# Patient Record
Sex: Female | Born: 1975
Health system: Southern US, Community
[De-identification: ages and names within clinical notes are randomized; demographics above are authoritative.]

## PROBLEM LIST (undated history)

## (undated) DIAGNOSIS — D649 Anemia, unspecified: Secondary | ICD-10-CM

## (undated) DIAGNOSIS — R51 Headache: Secondary | ICD-10-CM

## (undated) DIAGNOSIS — I10 Essential (primary) hypertension: Secondary | ICD-10-CM

## (undated) DIAGNOSIS — G4700 Insomnia, unspecified: Secondary | ICD-10-CM

## (undated) DIAGNOSIS — R519 Headache, unspecified: Secondary | ICD-10-CM

## (undated) DIAGNOSIS — L0291 Cutaneous abscess, unspecified: Secondary | ICD-10-CM

## (undated) DIAGNOSIS — Z9289 Personal history of other medical treatment: Secondary | ICD-10-CM

## (undated) HISTORY — PX: DILATION AND CURETTAGE OF UTERUS: SHX78

## (undated) HISTORY — PX: TONSILLECTOMY: SUR1361

---

## 2006-06-08 ENCOUNTER — Emergency Department (HOSPITAL_COMMUNITY): Admission: EM | Admit: 2006-06-08 | Discharge: 2006-06-09 | Payer: Self-pay | Admitting: Emergency Medicine

## 2006-07-26 ENCOUNTER — Emergency Department (HOSPITAL_COMMUNITY): Admission: EM | Admit: 2006-07-26 | Discharge: 2006-07-26 | Payer: Self-pay | Admitting: Emergency Medicine

## 2008-08-13 ENCOUNTER — Inpatient Hospital Stay (HOSPITAL_COMMUNITY): Admission: AD | Admit: 2008-08-13 | Discharge: 2008-08-13 | Payer: Self-pay | Admitting: Obstetrics & Gynecology

## 2008-08-16 ENCOUNTER — Inpatient Hospital Stay (HOSPITAL_COMMUNITY): Admission: RE | Admit: 2008-08-16 | Discharge: 2008-08-16 | Payer: Self-pay | Admitting: Obstetrics & Gynecology

## 2008-08-23 ENCOUNTER — Ambulatory Visit: Payer: Self-pay | Admitting: Advanced Practice Midwife

## 2008-08-23 ENCOUNTER — Inpatient Hospital Stay (HOSPITAL_COMMUNITY): Admission: RE | Admit: 2008-08-23 | Discharge: 2008-08-23 | Payer: Self-pay | Admitting: Obstetrics & Gynecology

## 2008-08-30 ENCOUNTER — Ambulatory Visit: Payer: Self-pay | Admitting: Obstetrics & Gynecology

## 2008-08-30 ENCOUNTER — Encounter: Payer: Self-pay | Admitting: Advanced Practice Midwife

## 2008-08-30 ENCOUNTER — Encounter: Payer: Self-pay | Admitting: Obstetrics and Gynecology

## 2008-08-30 LAB — CONVERTED CEMR LAB
Basophils Absolute: 0 10*3/uL (ref 0.0–0.1)
Basophils Relative: 0 % (ref 0–1)
Eosinophils Relative: 1 % (ref 0–5)
Hemoglobin: 12.3 g/dL (ref 12.0–15.0)
Hgb A2 Quant: 2.6 % (ref 2.2–3.2)
Hgb S Quant: 0 % (ref 0.0–0.0)
MCHC: 33.6 g/dL (ref 30.0–36.0)
Monocytes Relative: 5 % (ref 3–12)
RBC: 4.56 M/uL (ref 3.87–5.11)
RDW: 16.1 % — ABNORMAL HIGH (ref 11.5–15.5)
Rubella: 19.1 intl units/mL — ABNORMAL HIGH
WBC: 11.1 10*3/uL — ABNORMAL HIGH (ref 4.0–10.5)

## 2008-09-22 ENCOUNTER — Ambulatory Visit (HOSPITAL_COMMUNITY): Admission: RE | Admit: 2008-09-22 | Discharge: 2008-09-22 | Payer: Self-pay | Admitting: Obstetrics & Gynecology

## 2008-09-28 ENCOUNTER — Ambulatory Visit: Payer: Self-pay | Admitting: Obstetrics & Gynecology

## 2008-10-20 ENCOUNTER — Ambulatory Visit (HOSPITAL_COMMUNITY): Admission: RE | Admit: 2008-10-20 | Discharge: 2008-10-20 | Payer: Self-pay | Admitting: Obstetrics & Gynecology

## 2008-11-06 ENCOUNTER — Ambulatory Visit: Payer: Self-pay | Admitting: Obstetrics & Gynecology

## 2008-11-10 ENCOUNTER — Ambulatory Visit (HOSPITAL_COMMUNITY): Admission: RE | Admit: 2008-11-10 | Discharge: 2008-11-10 | Payer: Self-pay | Admitting: Obstetrics & Gynecology

## 2008-11-27 ENCOUNTER — Ambulatory Visit: Payer: Self-pay | Admitting: Obstetrics & Gynecology

## 2008-11-28 ENCOUNTER — Emergency Department (HOSPITAL_COMMUNITY): Admission: EM | Admit: 2008-11-28 | Discharge: 2008-11-28 | Payer: Self-pay | Admitting: Internal Medicine

## 2008-12-08 ENCOUNTER — Ambulatory Visit (HOSPITAL_COMMUNITY): Admission: RE | Admit: 2008-12-08 | Discharge: 2008-12-08 | Payer: Self-pay | Admitting: Obstetrics & Gynecology

## 2008-12-28 ENCOUNTER — Ambulatory Visit: Payer: Self-pay | Admitting: Obstetrics & Gynecology

## 2009-01-01 ENCOUNTER — Ambulatory Visit: Payer: Self-pay | Admitting: Obstetrics and Gynecology

## 2009-01-01 ENCOUNTER — Inpatient Hospital Stay (HOSPITAL_COMMUNITY): Admission: AD | Admit: 2009-01-01 | Discharge: 2009-01-01 | Payer: Self-pay | Admitting: Obstetrics & Gynecology

## 2009-01-04 ENCOUNTER — Ambulatory Visit: Payer: Self-pay | Admitting: Obstetrics & Gynecology

## 2009-01-05 ENCOUNTER — Ambulatory Visit (HOSPITAL_COMMUNITY): Admission: RE | Admit: 2009-01-05 | Discharge: 2009-01-05 | Payer: Self-pay | Admitting: Obstetrics & Gynecology

## 2009-02-05 ENCOUNTER — Ambulatory Visit: Payer: Self-pay | Admitting: Obstetrics & Gynecology

## 2009-02-05 ENCOUNTER — Encounter: Payer: Self-pay | Admitting: Family Medicine

## 2009-02-06 ENCOUNTER — Encounter: Payer: Self-pay | Admitting: Family Medicine

## 2009-02-06 LAB — CONVERTED CEMR LAB
HCT: 30.8 % — ABNORMAL LOW (ref 36.0–46.0)
Hemoglobin: 9.9 g/dL — ABNORMAL LOW (ref 12.0–15.0)
MCHC: 32.1 g/dL (ref 30.0–36.0)
Platelets: 143 10*3/uL — ABNORMAL LOW (ref 150–400)
RDW: 14.8 % (ref 11.5–15.5)

## 2009-02-12 ENCOUNTER — Ambulatory Visit: Payer: Self-pay | Admitting: Obstetrics & Gynecology

## 2009-02-13 ENCOUNTER — Ambulatory Visit (HOSPITAL_COMMUNITY): Admission: RE | Admit: 2009-02-13 | Discharge: 2009-02-13 | Payer: Self-pay | Admitting: Obstetrics & Gynecology

## 2009-02-22 ENCOUNTER — Ambulatory Visit: Payer: Self-pay | Admitting: Obstetrics & Gynecology

## 2009-02-26 ENCOUNTER — Ambulatory Visit: Payer: Self-pay | Admitting: Obstetrics & Gynecology

## 2009-03-05 ENCOUNTER — Ambulatory Visit: Payer: Self-pay | Admitting: Obstetrics & Gynecology

## 2009-03-08 ENCOUNTER — Ambulatory Visit: Payer: Self-pay | Admitting: Obstetrics & Gynecology

## 2009-03-12 ENCOUNTER — Ambulatory Visit: Payer: Self-pay | Admitting: Obstetrics & Gynecology

## 2009-03-12 ENCOUNTER — Encounter: Payer: Self-pay | Admitting: Obstetrics & Gynecology

## 2009-03-12 LAB — CONVERTED CEMR LAB
Alkaline Phosphatase: 104 units/L (ref 39–117)
BUN: 7 mg/dL (ref 6–23)
Chloride: 107 meq/L (ref 96–112)
Glucose, Bld: 85 mg/dL (ref 70–99)
HCT: 32.1 % — ABNORMAL LOW (ref 36.0–46.0)
MCV: 77.3 fL — ABNORMAL LOW (ref 78.0–100.0)
Potassium: 3.4 meq/L — ABNORMAL LOW (ref 3.5–5.3)
Protein, Ur: 57 mg/24hr (ref 50–100)
RDW: 14.8 % (ref 11.5–15.5)
Total Protein: 5.6 g/dL — ABNORMAL LOW (ref 6.0–8.3)
Trich, Wet Prep: NONE SEEN
Yeast Wet Prep HPF POC: NONE SEEN

## 2009-03-13 ENCOUNTER — Encounter: Payer: Self-pay | Admitting: Obstetrics & Gynecology

## 2009-03-13 ENCOUNTER — Ambulatory Visit (HOSPITAL_COMMUNITY): Admission: RE | Admit: 2009-03-13 | Discharge: 2009-03-13 | Payer: Self-pay | Admitting: Obstetrics & Gynecology

## 2009-03-17 ENCOUNTER — Ambulatory Visit: Payer: Self-pay | Admitting: Obstetrics and Gynecology

## 2009-03-17 ENCOUNTER — Inpatient Hospital Stay (HOSPITAL_COMMUNITY): Admission: AD | Admit: 2009-03-17 | Discharge: 2009-03-18 | Payer: Self-pay | Admitting: Obstetrics & Gynecology

## 2009-03-19 ENCOUNTER — Ambulatory Visit: Payer: Self-pay | Admitting: Obstetrics & Gynecology

## 2009-03-22 ENCOUNTER — Ambulatory Visit: Payer: Self-pay | Admitting: Obstetrics and Gynecology

## 2009-03-29 ENCOUNTER — Ambulatory Visit: Payer: Self-pay | Admitting: Obstetrics & Gynecology

## 2009-04-02 ENCOUNTER — Ambulatory Visit: Payer: Self-pay | Admitting: Obstetrics and Gynecology

## 2009-04-02 ENCOUNTER — Inpatient Hospital Stay (HOSPITAL_COMMUNITY): Admission: AD | Admit: 2009-04-02 | Discharge: 2009-04-06 | Payer: Self-pay | Admitting: Obstetrics & Gynecology

## 2009-04-02 ENCOUNTER — Ambulatory Visit: Payer: Self-pay | Admitting: Obstetrics & Gynecology

## 2010-04-07 ENCOUNTER — Encounter: Payer: Self-pay | Admitting: Obstetrics & Gynecology

## 2010-05-20 IMAGING — US US OB FOLLOW-UP
1 series · 18 of 28 positions shown · non-contrast
Comparison: none

OBSTETRICAL ULTRASOUND:
 This ultrasound was performed in The [HOSPITAL], and the [facility] report will be stored to [REDACTED] PACS.  This report is also available in [HOSPITAL]?s accessANYware.

[Series 1: us ob follow-up · 18 of 31 slices shown]
[im 1/31]
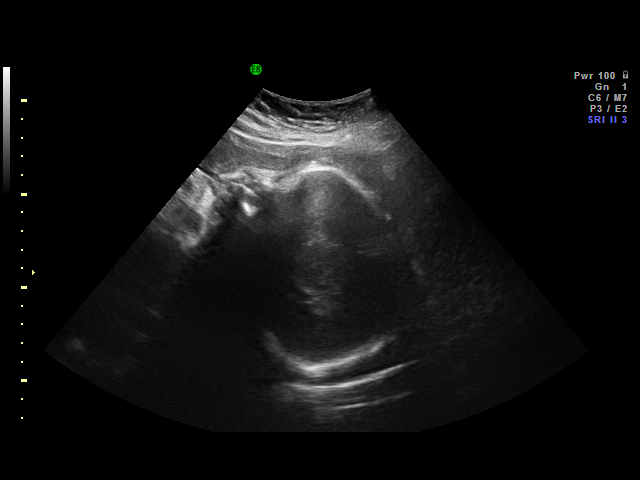
[im 3/31]
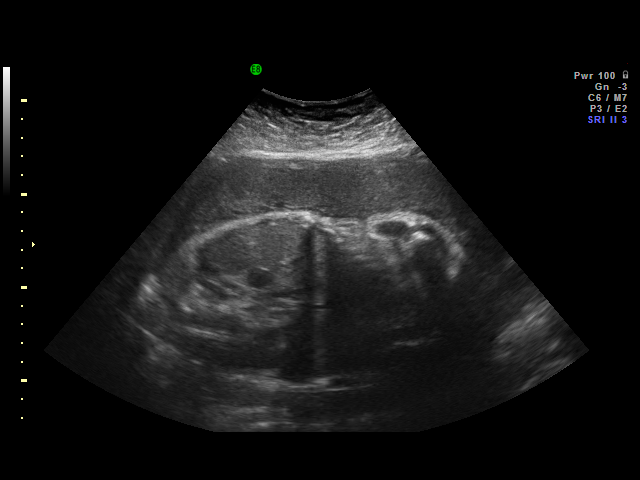
[im 4/31]
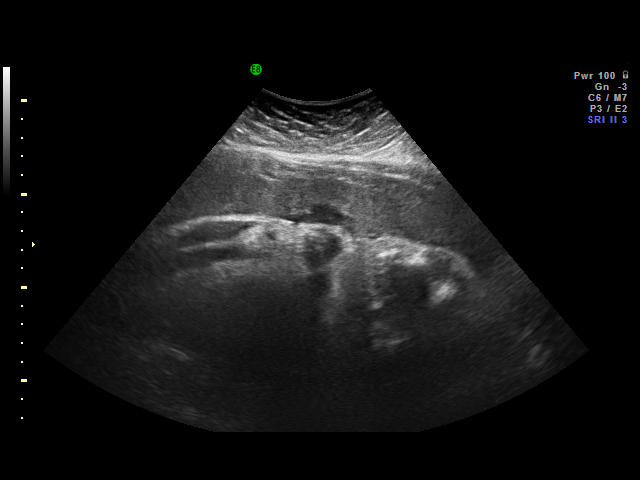
[im 6/31]
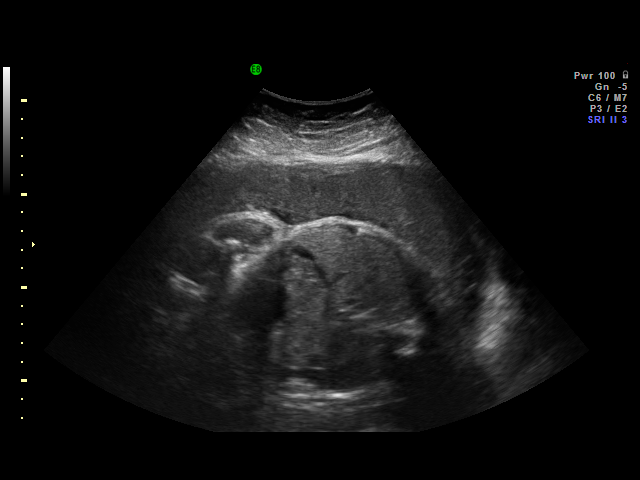
[im 8/31]
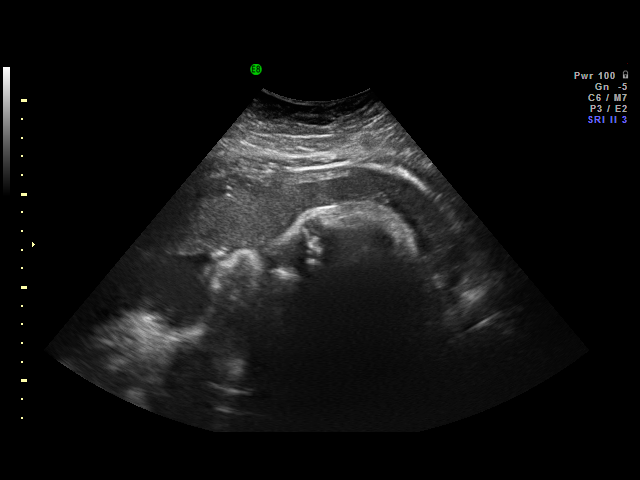
[im 9/31]
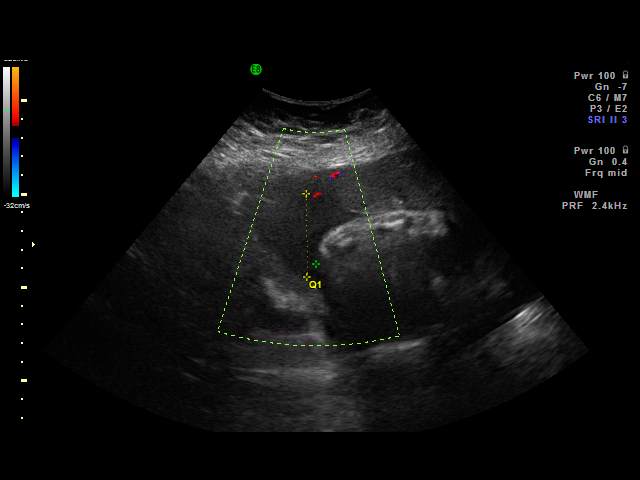
[im 12/31]
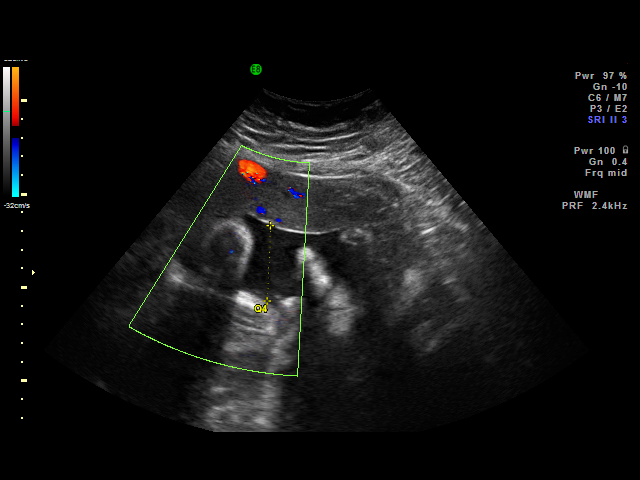
[im 13/31]
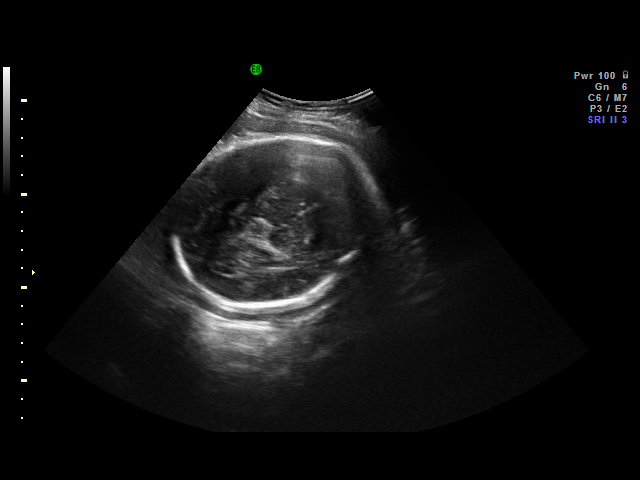
[im 15/31]
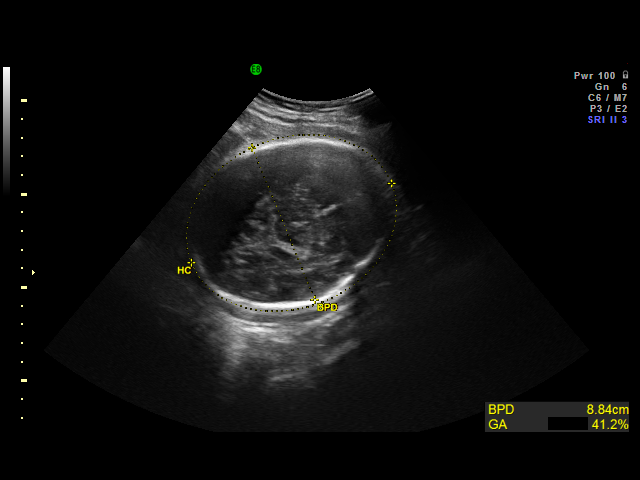
[im 16/31]
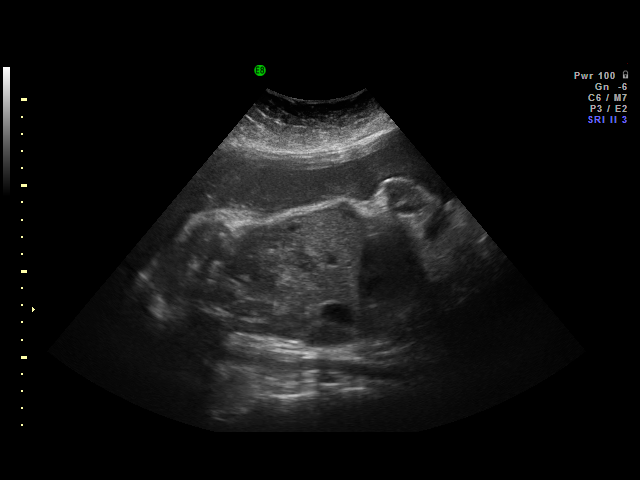
[im 18/31]
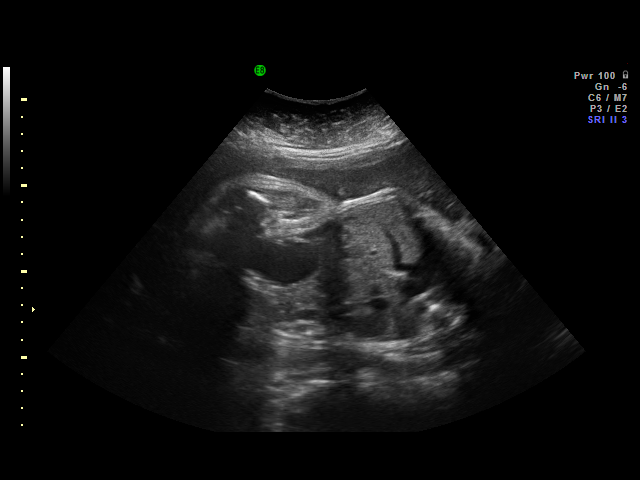
[im 19/31]
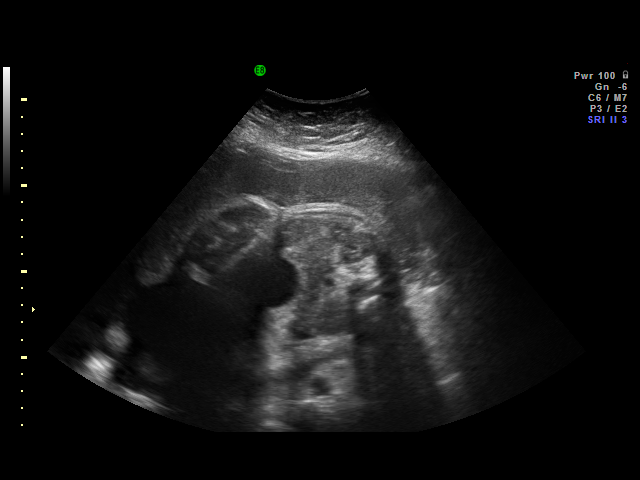
[im 22/31]
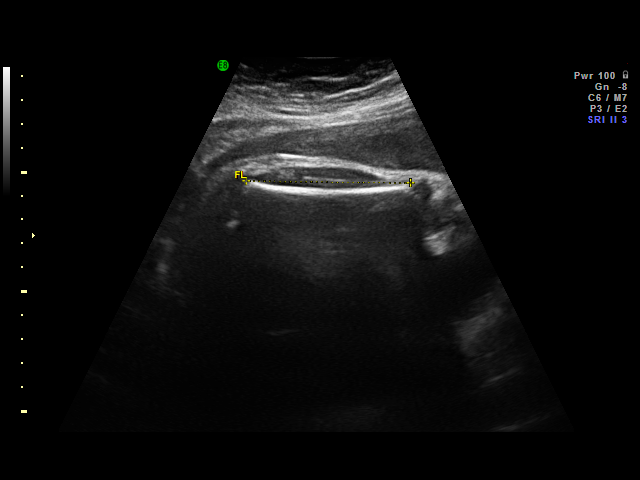
[im 24/31]
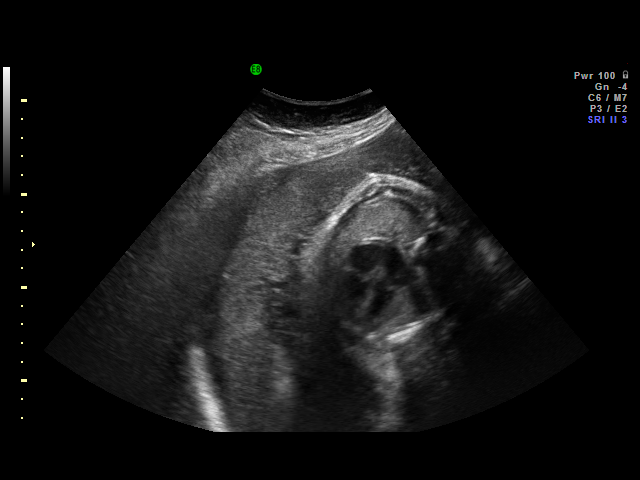
[im 25/31]
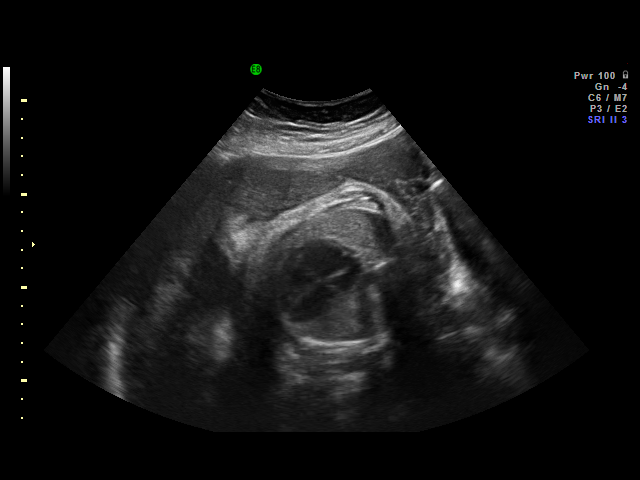
[im 27/31]
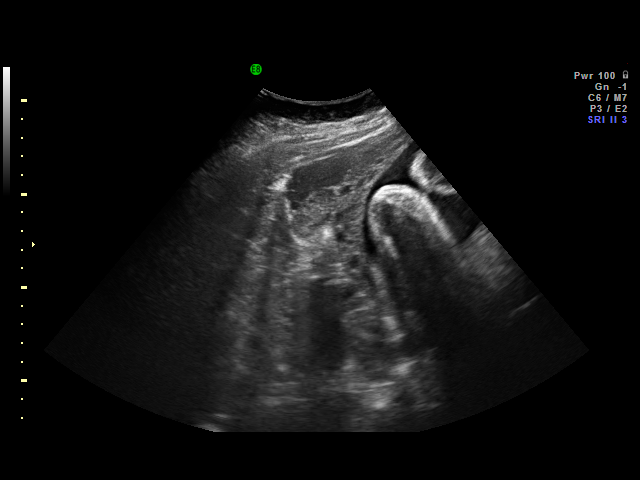
[im 28/31]
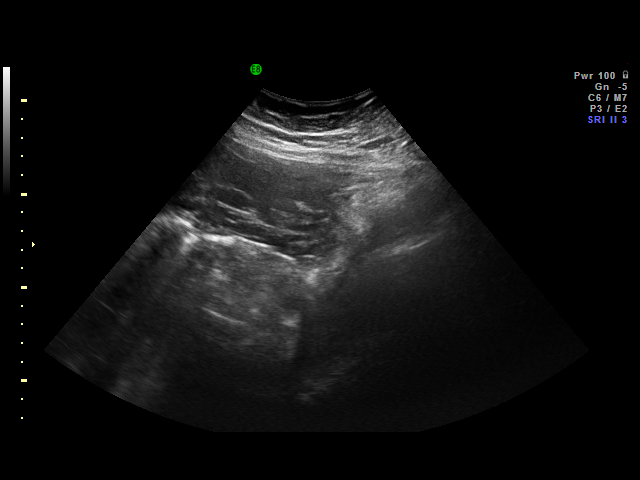
[im 31/31]
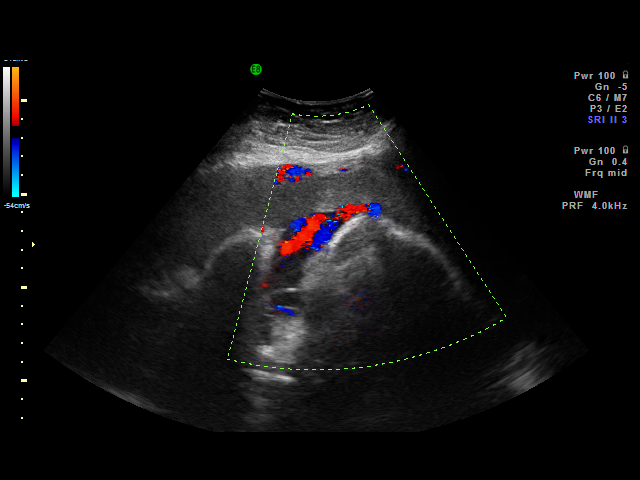

[18 of 28 positions shown; findings below may reference images not displayed]

IMPRESSION: [facility] has also been faxed to the ordering physician.

## 2010-06-02 LAB — POCT URINALYSIS DIP (DEVICE)
Bilirubin Urine: NEGATIVE
Hgb urine dipstick: NEGATIVE
Ketones, ur: NEGATIVE mg/dL
Ketones, ur: NEGATIVE mg/dL
Protein, ur: NEGATIVE mg/dL
Protein, ur: NEGATIVE mg/dL
Specific Gravity, Urine: <=1.005 (ref 1.005–1.030)
pH: 6 (ref 5.0–8.0)
pH: 6.5 (ref 5.0–8.0)

## 2010-06-02 LAB — CBC
HCT: 23.5 % — ABNORMAL LOW (ref 36.0–46.0)
HCT: 23.7 % — ABNORMAL LOW (ref 36.0–46.0)
HCT: 33.6 % — ABNORMAL LOW (ref 36.0–46.0)
Hemoglobin: 10.9 g/dL — ABNORMAL LOW (ref 12.0–15.0)
Hemoglobin: 7.1 g/dL — ABNORMAL LOW (ref 12.0–15.0)
Hemoglobin: 7.7 g/dL — ABNORMAL LOW (ref 12.0–15.0)
MCV: 80.1 fL (ref 78.0–100.0)
MCV: 81.1 fL (ref 78.0–100.0)
Platelets: 103 10*3/uL — ABNORMAL LOW (ref 150–400)
Platelets: 115 10*3/uL — ABNORMAL LOW (ref 150–400)
Platelets: 127 10*3/uL — ABNORMAL LOW (ref 150–400)
RBC: 2.95 MIL/uL — ABNORMAL LOW (ref 3.87–5.11)
RBC: 4.19 MIL/uL (ref 3.87–5.11)
RDW: 14.8 % (ref 11.5–15.5)
RDW: 15.1 % (ref 11.5–15.5)
WBC: 14.5 10*3/uL — ABNORMAL HIGH (ref 4.0–10.5)
WBC: 15.9 10*3/uL — ABNORMAL HIGH (ref 4.0–10.5)

## 2010-06-02 LAB — URIC ACID: Uric Acid, Serum: 3.3 mg/dL (ref 2.4–7.0)

## 2010-06-02 LAB — COMPREHENSIVE METABOLIC PANEL
Albumin: 2.9 g/dL — ABNORMAL LOW (ref 3.5–5.2)
Alkaline Phosphatase: 136 U/L — ABNORMAL HIGH (ref 39–117)
BUN: 5 mg/dL — ABNORMAL LOW (ref 6–23)
CO2: 24 mEq/L (ref 19–32)
Chloride: 107 mEq/L (ref 96–112)
Creatinine, Ser: 0.35 mg/dL — ABNORMAL LOW (ref 0.4–1.2)
GFR calc non Af Amer: >60 mL/min (ref >60)
Glucose, Bld: 82 mg/dL (ref 70–99)
Potassium: 4.1 mEq/L (ref 3.5–5.1)
Total Bilirubin: 0.4 mg/dL (ref 0.3–1.2)

## 2010-06-02 LAB — URINALYSIS, DIPSTICK ONLY
Glucose, UA: NEGATIVE mg/dL
Ketones, ur: NEGATIVE mg/dL
Leukocytes, UA: NEGATIVE
Protein, ur: NEGATIVE mg/dL
Urobilinogen, UA: 0.2 mg/dL (ref 0.0–1.0)

## 2010-06-02 LAB — RPR: RPR Ser Ql: NONREACTIVE

## 2010-06-17 LAB — POCT URINALYSIS DIP (DEVICE)
Bilirubin Urine: NEGATIVE
Hgb urine dipstick: NEGATIVE
Ketones, ur: NEGATIVE mg/dL
Protein, ur: NEGATIVE mg/dL
pH: 6.5 (ref 5.0–8.0)

## 2010-06-18 LAB — POCT URINALYSIS DIP (DEVICE)
Hgb urine dipstick: NEGATIVE
Ketones, ur: NEGATIVE mg/dL
Protein, ur: NEGATIVE mg/dL
Specific Gravity, Urine: 1.01 (ref 1.005–1.030)
Urobilinogen, UA: 0.2 mg/dL (ref 0.0–1.0)
pH: 6.5 (ref 5.0–8.0)

## 2010-06-19 LAB — POCT URINALYSIS DIP (DEVICE)
Bilirubin Urine: NEGATIVE
Hgb urine dipstick: NEGATIVE
Nitrite: NEGATIVE
Protein, ur: NEGATIVE mg/dL
Protein, ur: NEGATIVE mg/dL
Specific Gravity, Urine: 1.02 (ref 1.005–1.030)
Urobilinogen, UA: 1 mg/dL (ref 0.0–1.0)
Urobilinogen, UA: 1 mg/dL (ref 0.0–1.0)
pH: 6.5 (ref 5.0–8.0)
pH: 7 (ref 5.0–8.0)

## 2010-06-20 LAB — POCT URINALYSIS DIP (DEVICE)
Hgb urine dipstick: NEGATIVE
Ketones, ur: NEGATIVE mg/dL
Protein, ur: NEGATIVE mg/dL
Protein, ur: NEGATIVE mg/dL
Specific Gravity, Urine: 1.02 (ref 1.005–1.030)
Specific Gravity, Urine: 1.015 (ref 1.005–1.030)
Urobilinogen, UA: 1 mg/dL (ref 0.0–1.0)
pH: 6.5 (ref 5.0–8.0)

## 2010-06-20 LAB — URINALYSIS, ROUTINE W REFLEX MICROSCOPIC
Ketones, ur: NEGATIVE mg/dL
Nitrite: NEGATIVE
Protein, ur: NEGATIVE mg/dL
pH: 5 (ref 5.0–8.0)

## 2010-06-20 LAB — URINE CULTURE: Colony Count: 85000

## 2010-06-20 LAB — URINE MICROSCOPIC-ADD ON: RBC / HPF: NONE SEEN RBC/hpf (ref <3)

## 2010-06-21 LAB — POCT URINALYSIS DIP (DEVICE)
Hgb urine dipstick: NEGATIVE
Nitrite: NEGATIVE
Urobilinogen, UA: 1 mg/dL (ref 0.0–1.0)
pH: 5.5 (ref 5.0–8.0)

## 2010-06-22 LAB — POCT URINALYSIS DIP (DEVICE)
Nitrite: NEGATIVE
Protein, ur: NEGATIVE mg/dL
pH: 7 (ref 5.0–8.0)

## 2010-06-23 LAB — POCT URINALYSIS DIP (DEVICE)
Protein, ur: NEGATIVE mg/dL
Specific Gravity, Urine: <=1.005 (ref 1.005–1.030)
Urobilinogen, UA: 0.2 mg/dL (ref 0.0–1.0)

## 2010-06-24 LAB — CBC
HCT: 37.6 % (ref 36.0–46.0)
Hemoglobin: 12.6 g/dL (ref 12.0–15.0)
MCHC: 33.6 g/dL (ref 30.0–36.0)
MCV: 83.1 fL (ref 78.0–100.0)
RDW: 16.2 % — ABNORMAL HIGH (ref 11.5–15.5)

## 2010-06-24 LAB — POCT URINALYSIS DIP (DEVICE)
Glucose, UA: NEGATIVE mg/dL
Hgb urine dipstick: NEGATIVE
Nitrite: NEGATIVE
Protein, ur: NEGATIVE mg/dL
Urobilinogen, UA: 0.2 mg/dL (ref 0.0–1.0)

## 2010-06-24 LAB — DIFFERENTIAL
Eosinophils Relative: 1 % (ref 0–5)
Lymphocytes Relative: 19 % (ref 12–46)
Lymphs Abs: 2 10*3/uL (ref 0.7–4.0)
Monocytes Absolute: 0.6 10*3/uL (ref 0.1–1.0)
Monocytes Relative: 6 % (ref 3–12)

## 2010-06-24 LAB — CREATININE, SERUM: Creatinine, Ser: 0.49 mg/dL (ref 0.4–1.2)

## 2010-06-24 LAB — AST: AST: 14 U/L (ref 0–37)

## 2010-06-25 LAB — CBC
MCHC: 33.7 g/dL (ref 30.0–36.0)
MCV: 82.8 fL (ref 78.0–100.0)
RBC: 4.46 MIL/uL (ref 3.87–5.11)
RDW: 16.4 % — ABNORMAL HIGH (ref 11.5–15.5)

## 2010-06-25 LAB — URINALYSIS, ROUTINE W REFLEX MICROSCOPIC
Glucose, UA: NEGATIVE mg/dL
Hgb urine dipstick: NEGATIVE
Specific Gravity, Urine: <1.005 — ABNORMAL LOW (ref 1.005–1.030)
pH: 6.5 (ref 5.0–8.0)

## 2010-06-25 LAB — WET PREP, GENITAL: Trich, Wet Prep: NONE SEEN

## 2010-06-25 LAB — GC/CHLAMYDIA PROBE AMP, GENITAL
Chlamydia, DNA Probe: NEGATIVE
GC Probe Amp, Genital: NEGATIVE

## 2010-06-25 LAB — HCG, QUANTITATIVE, PREGNANCY: hCG, Beta Chain, Quant, S: 14132 m[IU]/mL — ABNORMAL HIGH (ref <5)

## 2010-07-30 NOTE — Letter (Signed)
September 22, 2008    Meagan Lincoln, MD  [facility]   RE:   Meagan, Nelson  MR#   [id number]  ACC#        [id number]   MFM CONSULTATION REPORT   Dear Dr. Penne Nelson:   Thank you for referring your patient, Meagan Nelson.  As you are aware,  Meagan Nelson is a 35 year old gravida 6, para 4-1-0-4 at 11-6/7 weeks'  gestation by her last menstrual period, consistent with early  ultrasounds.  As you are also aware, Meagan Nelson has a history of a prior  pregnancy with an IUFD at 7 weeks' gestation as well as a prior  pregnancy complicated by a IUGR.   Meagan Nelson was feeling well today and had no specific complaints.  She  reported resolving nausea with the pregnancy and denied significant  emesis.  She has not experienced any vaginal bleeding, abnormal vaginal  discharge or pelvic pain thus far in the pregnancy.   Meagan Nelson gives a history of an IUFD with her last pregnancy.  She  delivered this pregnancy in July 2009 and reports presenting for routine  prenatal care at 55 weeks' gestation and being diagnosed with an IUFD.  She underwent induction of labor and subsequently delivers what she  describes as a normal-appearing infant.  She does report this pregnancy  to be complicated by retained placenta, for which she underwent a D and  C.  She did not have an autopsy done and states that she was told this  fetal demise was the result of stress.  This delivery took place at  [facility] in [place], and records of this were requested today.  Meagan Nelson does report some elevated blood pressures during this  pregnancy but does not recall being diagnosed with preeclampsia and was  not treated with antihypertensives.  She has had minimal followup since  that time.   The remainder of Meagan Nelson's [facility] history includes 4 vaginal  deliveries at term without significant antepartum, intrapartum or  postpartum complications.  She does report with her first 2 pregnancies,  she had a vacuum-assisted delivery.  Additionally, one of her children,  delivered in 2002, weighed 4 pounds 6 ounces at term.  She does not  recall being diagnosed with fetal growth restriction prior to delivery  but does believe that she may have had polyhydramnios with this  pregnancy.  All of these children are alive and well at the current  time.   Meagan Nelson denies any history of abnormal Pap smears, STDs or cervical or  uterine procedures.  She does state that she had a benign ovarian cyst  removed in 2002 via laparoscopy.  Additionally, during this pregnancy  there has been a left adnexal mass noted on ultrasound.  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   Meagan Nelson denies overt knowledge of any significant medical history.  However, her blood pressure today is 143/89.  A repeat measure of this  was 132/83.  As mentioned above, she reports that she did have some  elevated blood pressures with her last pregnancy but has not been given  a formal diagnosis of chronic hypertension.  Her current medications  include only prenatal vitamins, and she has no known drug allergies.  Meagan Nelson has had no surgeries other than a tonsillectomy at  age 50 and  a D and C after her last delivery for retained placenta.  Meagan Nelson quit  smoking 2 months ago and drank alcohol on an intermittent basis prior to  pregnancy.  She reports no use of alcohol currently as well as use of  street drugs, including marijuana.   The father of the baby is reported to have a congenital heart defect  with significant hospitalization during his infancy.  He was not present  for the visit and so further details of this were unclear.  Meagan Nelson  also reports that her mother and her maternal grandmother have had  recurrent deep venous thromboses.  She reports that  both of these  individuals are maintained on long-term Coumadin.  She is unsure of any  specific etiologies for these.  Meagan Nelson denies any other significant  family history, including congenital anomalies or genetic diseases.  Review of systems today was negative.   On exam today, Meagan Nelson's blood pressure was elevated as described  above.  Her pulse was 98 beats per minute and her weight was 243 pounds.  She appeared in no apparent distress.  Her abdomen was soft, obese and nontender.   An ultrasound was performed today, which revealed embryonic size to be  consistent with dating by the patient's last menstrual period at 11  weeks 6 days.  A singleton intrauterine gestation was identified with  normal fetal cardiac activity.  Meagan Nelson desired first-semester  screening for fetal aneuploidy and a nuchal translucency measurement was  done today and found to be 1.08 mm, well within the normal range.  She  elected to proceed with integrated screening and the first blood draw  for this was performed today.  The previously-noted left paraovarian  mass was again seen and measured 1.8 cm in greatest dimension.  A 6-cm  central cyst was seen with minimal interval change since her last  ultrasound.  Additionally the anterior wall of the uterus appeared  heterogeneous and thicker than expected.  However, at this point in time  the placenta appeared to be separate and distinct from this area, with  the suspicion of adenomyosis.   The implications of a prior third-trimester IUFD were discussed with [name] at length today.  The small increased risk for similar outcome in  her current pregnancy was described as well as strategies for monitoring  and managing her current pregnancy.  These include a detailed fetal  anatomic survey at approximately 18 weeks' gestation, followed by serial  ultrasounds every 4-6 weeks to evaluate fetal growth and amniotic fluid  volume.  Given that Meagan Nelson has  also had a previous pregnancy with  intrauterine growth restriction, this approach would be helpful in that  regard as well.  Antenatal testing is also recommended during this  gestation.  BPP monitoring could be initiated as early 89 weeks'  gestation with nonstress test twice weekly implemented at 32 weeks'  gestation.  Providing maternal fetal status remained stable through the  pregnancy, delivery at 19 weeks' gestation would be indicated.  Records  from Meagan Nelson's previous pregnancy complicated by IUFD were requested  today and we will review these and make additional recommendations as  necessary based on the information contained therein.  It is somewhat  concerning that Ms. Bohr's mother and maternal grandmother have had  recurrent DVTs.  This combined with her prior poor pregnancy history  would be an indication for a thrombophilia panel for Ms. Tarpley.  This  was drawn today.  The implications of positive findings on this  evaluation as well as the potential benefit of low-molecular-weight  heparin therapy during pregnancy were discussed with Ms. Aydt briefly.  She has elected to undergo integrated screening today.  The results of  this combined with findings on her fetal anatomy ultrasound can help  refine risks for fetal chromosome anomalies that may have been  associated with prior IUFD.  She reports having an early Glucola with  this pregnancy.  Followup of these results is recommended.   In addition, Ms. Racz's blood pressures are mildly elevated today and  she gives a history of potentially elevated blood pressures with her  last pregnancy.  This is concerning for a possible diagnosis of chronic  hypertension.  We would recommend close followup of her blood pressures.  If these remain persistently 140 or 90 prior to 20 weeks' gestation,  then instituting antihypertensive therapy would be indicated.  If she  were to experience significant elevations of her blood pressure  after 20  weeks' gestation, an evaluation for preeclampsia should be undertaken  prior to increasing or initiating antihypertensives.  The possibility of  an increased risk of development of preeclampsia during this pregnancy  and the possibility of preeclampsia contributing to her prior poor  pregnancy outcomes in prior pregnancies was discussed today.  A baseline  24-hour urine was ordered and signs and symptoms of preeclampsia were  reviewed.   Father of Ms. Schuld's baby has a likely history of congenital cardiac  disease.  Based on this, we recommend a fetal echo at approximately 24-  26 weeks' gestation.   Ms. Machnik's left adnexal mass which has been seen on prior ultrasounds  was again seen today and likely represents a benign process.  We would  recommend continued followup with this during Ms. Parlett's fetal  ultrasounds through the pregnancy.  Additionally, the anterior  myometrium appeared thickened today.  The implications of this are  uncertain.  She does report a D and C for retained placenta after her  last pregnancy, and this may be a risk for abnormal endometrium and  myometrium, which could increase the risk for a placenta accreta.  The  placenta was seen distinct from this area today, but close followup with  ultrasound of this is recommended.  Additionally, evaluation of this in  a nonpregnant state after completing the postpartum period may be of  benefit should Ms. Frank's pregnancy be uneventful.  We have also  ordered a CMP today.   Thank you for allowing [place] to participate in the care of Ms. Haggard.  We  will follow up her laboratory studies and look forward to seeing her  back in 4 weeks for a second blood draw for integrated screen and 7  weeks for fetal anatomic survey.  Please feel free to contact us at any  time regarding this or any patient you may have.   Sincerely,   Sincerely,      Heather L. Rachel Bo, MD  Maternal Fetal Medicine  [facility]     HLM/MEDQ  D:  09/22/2008  T:  09/22/2008  Job:  [id number]

## 2014-03-16 ENCOUNTER — Encounter (HOSPITAL_COMMUNITY): Payer: Self-pay | Admitting: Emergency Medicine

## 2014-03-16 ENCOUNTER — Emergency Department (HOSPITAL_COMMUNITY)
Admission: EM | Admit: 2014-03-16 | Discharge: 2014-03-16 | Disposition: A | Discharge status: Home / Self Care | Payer: Self-pay | Attending: Emergency Medicine | Admitting: Emergency Medicine

## 2014-03-16 DIAGNOSIS — K0381 Cracked tooth: Secondary | ICD-10-CM | POA: Insufficient documentation

## 2014-03-16 DIAGNOSIS — K047 Periapical abscess without sinus: Secondary | ICD-10-CM | POA: Insufficient documentation

## 2014-03-16 DIAGNOSIS — Z87891 Personal history of nicotine dependence: Secondary | ICD-10-CM | POA: Insufficient documentation

## 2014-03-16 DIAGNOSIS — Z79899 Other long term (current) drug therapy: Secondary | ICD-10-CM | POA: Insufficient documentation

## 2014-03-16 MED ORDER — NAPROXEN 500 MG PO TABS: 500.0000 mg | ORAL_TABLET | Freq: Two times a day (BID) | ORAL | Status: DC

## 2014-03-16 MED ORDER — HYDROCODONE-ACETAMINOPHEN 5-325 MG PO TABS
1.0000 | ORAL_TABLET | Freq: Once | ORAL | Status: AC
  Administered 2014-03-16: 1 via ORAL
  Filled 2014-03-16: qty 1

## 2014-03-16 MED ORDER — HYDROCODONE-ACETAMINOPHEN 5-325 MG PO TABS: 1.0000 | ORAL_TABLET | Freq: Four times a day (QID) | ORAL | Status: DC | PRN

## 2014-03-16 MED ORDER — PENICILLIN V POTASSIUM 500 MG PO TABS: 500.0000 mg | ORAL_TABLET | Freq: Four times a day (QID) | ORAL | Status: DC

## 2014-03-16 NOTE — ED Notes (Signed)
Pt arrives POV with c/o dental pain, states she noticed  Area was swollen yesterday, pain started around 2300.

## 2014-03-16 NOTE — ED Provider Notes (Signed)
CSN: [id number]     Arrival date & time 03/16/14  0202 History  This chart was scribed for Vanetta MuldersScott Madelina Sanda, MD by Annye AsaAnna Dorsett, ED Scribe. This patient was seen in room A10C/A10C and the patient's care was started at 3:27 AM.    Chief Complaint  Patient presents with  . Dental Pain   Patient is a 38 y.o. female presenting with tooth pain. The history is provided by the patient. No language interpreter was used.  Dental Pain Location:  Lower Quality:  Constant Severity:  Severe Onset quality:  Gradual Duration:  5 hours Timing:  Constant Progression:  Unchanged Chronicity:  New Context: dental fracture   Relieved by:  None tried Worsened by:  Nothing tried Ineffective treatments:  None tried Associated symptoms: no fever and no headaches      HPI Comments: Meagan Nelson is a 38 y.o. female who presents to the Emergency Department complaining of lower left-sided dental pain. Patient reports the pain began around 23:00 tonight after noticing swelling upon waking this morning. She notes that she broke this tooth two weeks PTA and was told by the dentist that her insurance was unable to cover treatment at that time. She rates her pain as a 10/10 at this time.    History reviewed. No pertinent past medical history. History reviewed. No pertinent past surgical history. No family history on file. History  Substance Use Topics  . Smoking status: Former Games developermoker  . Smokeless tobacco: Not on file  . Alcohol Use: No   OB History    No data available     Review of Systems  Constitutional: Negative for fever and chills.  Respiratory: Negative for shortness of breath.   Cardiovascular: Negative for chest pain.  Gastrointestinal: Negative for nausea, vomiting, abdominal pain and diarrhea.  Skin: Negative for rash.  Neurological: Negative for headaches.  Hematological: Does not bruise/bleed easily.  Psychiatric/Behavioral: Negative for confusion.      Allergies  Review of patient's  allergies indicates no known allergies.  Home Medications   Prior to Admission medications   Medication Sig Start Date End Date Taking? Authorizing Provider  acetaminophen (TYLENOL) 500 MG tablet Take 500-1,000 mg by mouth every 6 (six) hours as needed for moderate pain.   Yes Historical Provider, MD  HYDROcodone-acetaminophen (NORCO/VICODIN) 5-325 MG per tablet Take 1-2 tablets by mouth every 6 (six) hours as needed for moderate pain. 03/16/14   Vanetta MuldersScott Dahna Hattabaugh, MD  naproxen (NAPROSYN) 500 MG tablet Take 1 tablet (500 mg total) by mouth 2 (two) times daily. 03/16/14   Vanetta MuldersScott Nysia Dell, MD  penicillin v potassium (VEETID) 500 MG tablet Take 1 tablet (500 mg total) by mouth 4 (four) times daily. 03/16/14   Vanetta MuldersScott Yulieth Carrender, MD   BP 146/95 mmHg  Pulse 76  Temp(Src) 98.4 F (36.9 C) (Oral)  Resp 16  Ht 5\' 5"  (1.651 m)  Wt 210 lb (95.255 kg)  BMI 34.95 kg/m2  SpO2 90%  LMP 02/01/2014 (Exact Date) Physical Exam  Constitutional: She is oriented to person, place, and time. She appears well-developed and well-nourished.  HENT:  Head: Normocephalic and atraumatic.  Mouth/Throat: Oropharynx is clear and moist. No oropharyngeal exudate.  Several cavities on lower left with lump in line with the jaw measuring 2cm; no swelling to the floor of the mouth  Eyes: EOM are normal. Pupils are equal, round, and reactive to light.  Cardiovascular: Normal rate, regular rhythm and normal heart sounds.  Exam reveals no gallop and no friction rub.  No murmur heard. Pulmonary/Chest: Effort normal and breath sounds normal. No respiratory distress. She has no wheezes. She has no rales.  Abdominal: Soft. There is no tenderness. There is no rebound and no guarding.  Musculoskeletal: Normal range of motion. She exhibits no edema.  Lymphadenopathy:    She has no cervical adenopathy.  Neurological: She is alert and oriented to person, place, and time.  Skin: Skin is warm and dry. No rash noted.  Psychiatric: She  has a normal mood and affect. Her behavior is normal.  Nursing note and vitals reviewed.   ED Course  Procedures   DIAGNOSTIC STUDIES: Oxygen Saturation is 100% on RA, normal by my interpretation.    COORDINATION OF CARE: 3:31 AM Discussed treatment plan with pt at bedside and pt agreed to plan.   Labs Review Labs Reviewed - No data to display  Imaging Review No results found.   EKG Interpretation None      MDM   Final diagnoses:  Tooth abscess    No floor the mouth swelling. Seems to be consistent with a tooth abscess without complications.   I personally performed the services described in this documentation, which was scribed in my presence. The recorded information has been reviewed and is accurate.       Vanetta MuldersScott Arnitra Sokoloski, MD 03/22/14 [phone]

## 2014-03-16 NOTE — Discharge Instructions (Signed)
Abscessed Tooth An abscessed tooth is an infection around your tooth. It may be caused by holes or damage to the tooth (cavity) or a dental disease. An abscessed tooth causes mild to very bad pain in and around the tooth. See your dentist right away if you have tooth or gum pain. HOME CARE  Take your medicine as told. Finish it even if you start to feel better.  Do not drive after taking pain medicine.  Rinse your mouth (gargle) often with salt water ( teaspoon salt in 8 ounces of warm water).  Do not apply heat to the outside of your face. GET HELP RIGHT AWAY IF:   You have a temperature by mouth above 102 F (38.9 C), not controlled by medicine.  You have chills and a very bad headache.  You have problems breathing or swallowing.  Your mouth will not open.  You develop puffiness (swelling) on the neck or around the eye.  Your pain is not helped by medicine.  Your pain is getting worse instead of better. MAKE SURE YOU:   Understand these instructions.  Will watch your condition.  Will get help right away if you are not doing well or get worse. Document Released: 08/20/2007 Document Revised: 05/26/2011 Document Reviewed: 06/11/2010 [facility] Patient Information 2015 [employer] [place]. This information is not intended to replace advice given to you by your health care provider. Make sure you discuss any questions you have with your health care provider.  Take pain medications as directed. Take the Naprosyn on a regular basis. Supplement with hydrocodone as needed. Take the penicillin until finished. Follow-up with Dentist.

## 2014-05-17 ENCOUNTER — Encounter (HOSPITAL_COMMUNITY): Payer: Self-pay | Admitting: Emergency Medicine

## 2014-05-17 ENCOUNTER — Emergency Department (HOSPITAL_COMMUNITY)
Admission: EM | Admit: 2014-05-17 | Discharge: 2014-05-17 | Disposition: A | Discharge status: Home / Self Care | Payer: Self-pay | Attending: Emergency Medicine | Admitting: Emergency Medicine

## 2014-05-17 DIAGNOSIS — Z79899 Other long term (current) drug therapy: Secondary | ICD-10-CM | POA: Insufficient documentation

## 2014-05-17 DIAGNOSIS — I10 Essential (primary) hypertension: Secondary | ICD-10-CM | POA: Insufficient documentation

## 2014-05-17 DIAGNOSIS — M546 Pain in thoracic spine: Secondary | ICD-10-CM | POA: Insufficient documentation

## 2014-05-17 DIAGNOSIS — Z791 Long term (current) use of non-steroidal anti-inflammatories (NSAID): Secondary | ICD-10-CM | POA: Insufficient documentation

## 2014-05-17 DIAGNOSIS — Z87828 Personal history of other (healed) physical injury and trauma: Secondary | ICD-10-CM | POA: Insufficient documentation

## 2014-05-17 DIAGNOSIS — Z87891 Personal history of nicotine dependence: Secondary | ICD-10-CM | POA: Insufficient documentation

## 2014-05-17 DIAGNOSIS — Z792 Long term (current) use of antibiotics: Secondary | ICD-10-CM | POA: Insufficient documentation

## 2014-05-17 HISTORY — DX: Essential (primary) hypertension: I10

## 2014-05-17 MED ORDER — HYDROCODONE-ACETAMINOPHEN 5-325 MG PO TABS: 1.0000 | ORAL_TABLET | ORAL | Status: DC | PRN

## 2014-05-17 MED ORDER — HYDROCODONE-ACETAMINOPHEN 5-325 MG PO TABS
2.0000 | ORAL_TABLET | Freq: Once | ORAL | Status: AC
  Administered 2014-05-17: 2 via ORAL
  Filled 2014-05-17: qty 2
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
## 2014-05-17 NOTE — Discharge Instructions (Signed)
Return to the emergency room with worsening of symptoms, new symptoms or with symptoms that are concerning , especially fevers, loss of control of bladder or bowels, numbness or tingling around genital region or anus, weakness. RICE: Rest, Ice (three cycles of 20 mins on, 35mins off at least twice a day), compression/brace, elevation. Heating pad works well for back pain. Ibuprofen $RemoveBeforeD'400mg'[id number]$  (2 tablets $RemoveBe'200mg'LSluKIkOY$ ) every 5-6 hours for 3-5 days.  Norco for severe pain. Do not operate machinery, drive or drink alcohol while taking narcotics or muscle relaxers. Follow up with PCP/orthopedist if symptoms worsen or are persistent. Read below information and follow recommendations. Back Injury Prevention Back injuries can be extremely painful and difficult to heal. After having one back injury, you are much more likely to experience another later on. It is important to learn how to avoid injuring or re-injuring your back. The following tips can help you to prevent a back injury. PHYSICAL FITNESS  Exercise regularly and try to develop good tone in your abdominal muscles. Your abdominal muscles provide a lot of the support needed by your back.  Do aerobic exercises (walking, jogging, biking, swimming) regularly.  Do exercises that increase balance and strength (tai chi, yoga) regularly. This can decrease your risk of falling and injuring your back.  Stretch before and after exercising.  Maintain a healthy weight. The more you weigh, the more stress is placed on your back. For every pound of weight, 10 times that amount of pressure is placed on the back. DIET  Talk to your caregiver about how much calcium and vitamin D you need per day. These nutrients help to prevent weakening of the bones (osteoporosis). Osteoporosis can cause broken (fractured) bones that lead to back pain.  Include good sources of calcium in your diet, such as dairy products, green, leafy vegetables, and products with calcium added  (fortified).  Include good sources of vitamin D in your diet, such as milk and foods that are fortified with vitamin D.  Consider taking a nutritional supplement or a multivitamin if needed.  Stop smoking if you smoke. POSTURE  Sit and stand up straight. Avoid leaning forward when you sit or hunching over when you stand.  Choose chairs with good low back (lumbar) support.  If you work at a desk, sit close to your work so you do not need to lean over. Keep your chin tucked in. Keep your neck drawn back and elbows bent at a right angle. Your arms should look like the letter "L."  Sit high and close to the steering wheel when you drive. Add a lumbar support to your car seat if needed.  Avoid sitting or standing in one position for too long. Take breaks to get up, stretch, and walk around at least once every hour. Take breaks if you are driving for long periods of time.  Sleep on your side with your knees slightly bent, or sleep on your back with a pillow under your knees. Do not sleep on your stomach. LIFTING, TWISTING, AND REACHING  Avoid heavy lifting, especially repetitive lifting. If you must do heavy lifting:  Stretch before lifting.  Work slowly.  Rest between lifts.  Use carts and dollies to move objects when possible.  Make several small trips instead of carrying 1 heavy load.  Ask for help when you need it.  Ask for help when moving big, awkward objects.  Follow these steps when lifting:  Stand with your feet shoulder-width apart.  Get as close to the  object as you can. Do not try to pick up heavy objects that are far from your body.  Use handles or lifting straps if they are available.  Bend at your knees. Squat down, but keep your heels off the floor.  Keep your shoulders pulled back, your chin tucked in, and your back straight.  Lift the object slowly, tightening the muscles in your legs, abdomen, and buttocks. Keep the object as close to the center of your  body as possible.  When you put a load down, use these same guidelines in reverse.  Do not:  Lift the object above your waist.  Twist at the waist while lifting or carrying a load. Move your feet if you need to turn, not your waist.  Bend over without bending at your knees.  Avoid reaching over your head, across a table, or for an object on a high surface. OTHER TIPS  Avoid wet floors and keep sidewalks clear of ice to prevent falls.  Do not sleep on a mattress that is too soft or too hard.  Keep items that are used frequently within easy reach.  Put heavier objects on shelves at waist level and lighter objects on lower or higher shelves.  Find ways to decrease your stress, such as exercise, massage, or relaxation techniques. Stress can build up in your muscles. Tense muscles are more vulnerable to injury.  Seek treatment for depression or anxiety if needed. These conditions can increase your risk of developing back pain. SEEK MEDICAL CARE IF:  You injure your back.  You have questions about diet, exercise, or other ways to prevent back injuries. MAKE SURE YOU:  Understand these instructions.  Will watch your condition.  Will get help right away if you are not doing well or get worse. Document Released: 04/10/2004 Document Revised: 05/26/2011 Document Reviewed: 04/14/2011 [facility] Patient Information 2015 [place], [place]. This information is not intended to replace advice given to you by your health care provider. Make sure you discuss any questions you have with your health care provider.
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
## 2014-09-01 ENCOUNTER — Observation Stay (HOSPITAL_COMMUNITY): Payer: Self-pay

## 2014-09-01 ENCOUNTER — Emergency Department (HOSPITAL_COMMUNITY): Payer: Self-pay

## 2014-09-01 ENCOUNTER — Inpatient Hospital Stay (HOSPITAL_COMMUNITY)
Admission: EM | Admit: 2014-09-01 | Discharge: 2014-09-03 | DRG: 159 | Disposition: A | Discharge status: Home / Self Care | Payer: Self-pay | Attending: Internal Medicine | Admitting: Internal Medicine

## 2014-09-01 ENCOUNTER — Encounter (HOSPITAL_COMMUNITY): Payer: Self-pay | Admitting: Emergency Medicine

## 2014-09-01 DIAGNOSIS — K040 Pulpitis: Secondary | ICD-10-CM

## 2014-09-01 DIAGNOSIS — D509 Iron deficiency anemia, unspecified: Secondary | ICD-10-CM | POA: Diagnosis present

## 2014-09-01 DIAGNOSIS — D649 Anemia, unspecified: Secondary | ICD-10-CM | POA: Diagnosis present

## 2014-09-01 DIAGNOSIS — Z87891 Personal history of nicotine dependence: Secondary | ICD-10-CM

## 2014-09-01 DIAGNOSIS — K047 Periapical abscess without sinus: Secondary | ICD-10-CM | POA: Diagnosis present

## 2014-09-01 DIAGNOSIS — M264 Malocclusion, unspecified: Secondary | ICD-10-CM | POA: Diagnosis present

## 2014-09-01 DIAGNOSIS — R221 Localized swelling, mass and lump, neck: Secondary | ICD-10-CM

## 2014-09-01 DIAGNOSIS — K0530 Chronic periodontitis, unspecified: Secondary | ICD-10-CM | POA: Diagnosis present

## 2014-09-01 DIAGNOSIS — L03211 Cellulitis of face: Secondary | ICD-10-CM | POA: Insufficient documentation

## 2014-09-01 DIAGNOSIS — K045 Chronic apical periodontitis: Secondary | ICD-10-CM

## 2014-09-01 DIAGNOSIS — R220 Localized swelling, mass and lump, head: Secondary | ICD-10-CM

## 2014-09-01 DIAGNOSIS — K029 Dental caries, unspecified: Secondary | ICD-10-CM | POA: Diagnosis present

## 2014-09-01 DIAGNOSIS — I10 Essential (primary) hypertension: Secondary | ICD-10-CM | POA: Diagnosis present

## 2014-09-01 LAB — CBC
HCT: 21.7 % — ABNORMAL LOW (ref 36.0–46.0)
HCT: 23.4 % — ABNORMAL LOW (ref 36.0–46.0)
HEMOGLOBIN: 5.8 g/dL — AB (ref 12.0–15.0)
Hemoglobin: 6.7 g/dL — CL (ref 12.0–15.0)
MCH: 14.9 pg — AB (ref 26.0–34.0)
MCH: 16.8 pg — AB (ref 26.0–34.0)
MCHC: 26.7 g/dL — AB (ref 30.0–36.0)
MCHC: 28.6 g/dL — AB (ref 30.0–36.0)
MCV: 55.8 fL — AB (ref 78.0–100.0)
MCV: 58.8 fL — ABNORMAL LOW (ref 78.0–100.0)
Platelets: 137 10*3/uL — ABNORMAL LOW (ref 150–400)
Platelets: 121 10*3/uL — ABNORMAL LOW (ref 150–400)
RBC: 3.89 MIL/uL (ref 3.87–5.11)
RBC: 3.98 MIL/uL (ref 3.87–5.11)
RDW: 20.4 % — ABNORMAL HIGH (ref 11.5–15.5)
RDW: 23.3 % — ABNORMAL HIGH (ref 11.5–15.5)
WBC: 5.1 10*3/uL (ref 4.0–10.5)
WBC: 5.3 10*3/uL (ref 4.0–10.5)

## 2014-09-01 LAB — I-STAT CG4 LACTIC ACID, ED: LACTIC ACID, VENOUS: 1.25 mmol/L (ref 0.5–2.0)

## 2014-09-01 LAB — PREPARE RBC (CROSSMATCH)

## 2014-09-01 LAB — BASIC METABOLIC PANEL
ANION GAP: 10 (ref 5–15)
BUN: 7 mg/dL (ref 6–20)
CALCIUM: 8 mg/dL — AB (ref 8.9–10.3)
CO2: 19 mmol/L — AB (ref 22–32)
Chloride: 108 mmol/L (ref 101–111)
Creatinine, Ser: 0.67 mg/dL (ref 0.44–1.00)
GFR calc Af Amer: >60 mL/min (ref >60)
Glucose, Bld: 104 mg/dL — ABNORMAL HIGH (ref 65–99)
Potassium: 3.4 mmol/L — ABNORMAL LOW (ref 3.5–5.1)
SODIUM: 137 mmol/L (ref 135–145)

## 2014-09-01 LAB — IRON AND TIBC
Iron: 9 ug/dL — ABNORMAL LOW (ref 28–170)
SATURATION RATIOS: 2 % — AB (ref 10.4–31.8)
TIBC: 503 ug/dL — ABNORMAL HIGH (ref 250–450)
UIBC: 494 ug/dL

## 2014-09-01 LAB — I-STAT CHEM 8, ED
BUN: 6 mg/dL (ref 6–20)
Calcium, Ion: 1.12 mmol/L (ref 1.12–1.23)
Chloride: 109 mmol/L (ref 101–111)
Creatinine, Ser: 0.6 mg/dL (ref 0.44–1.00)
GLUCOSE: 101 mg/dL — AB (ref 65–99)
HCT: 22 % — ABNORMAL LOW (ref 36.0–46.0)
Hemoglobin: 7.5 g/dL — ABNORMAL LOW (ref 12.0–15.0)
Potassium: 3.5 mmol/L (ref 3.5–5.1)
SODIUM: 141 mmol/L (ref 135–145)
TCO2: 19 mmol/L (ref 0–100)

## 2014-09-01 LAB — FERRITIN: FERRITIN: 5 ng/mL — AB (ref 11–307)

## 2014-09-01 LAB — I-STAT BETA HCG BLOOD, ED (MC, WL, AP ONLY)

## 2014-09-01 LAB — ABO/RH: ABO/RH(D): B POS

## 2014-09-01 LAB — HEMOGLOBIN AND HEMATOCRIT, BLOOD
HEMATOCRIT: 22 % — AB (ref 36.0–46.0)
HEMOGLOBIN: 5.9 g/dL — AB (ref 12.0–15.0)

## 2014-09-01 MED ORDER — SODIUM CHLORIDE 0.9 % IV SOLN
Freq: Once | INTRAVENOUS | Status: AC
  Administered 2014-09-01: 06:00:00 via INTRAVENOUS

## 2014-09-01 MED ORDER — MORPHINE SULFATE 2 MG/ML IJ SOLN
2.0000 mg | INTRAMUSCULAR | Status: DC | PRN
  Administered 2014-09-01 (×2): 4 mg via INTRAVENOUS
  Filled 2014-09-01 (×3): qty 2

## 2014-09-01 MED ORDER — ACETAMINOPHEN 500 MG PO TABS
1000.0000 mg | ORAL_TABLET | Freq: Once | ORAL | Status: AC
  Administered 2014-09-01: 1000 mg via ORAL
  Filled 2014-09-01: qty 2

## 2014-09-01 MED ORDER — SODIUM CHLORIDE 0.9 % IV SOLN
3.0000 g | Freq: Three times a day (TID) | INTRAVENOUS | Status: DC
  Administered 2014-09-01 – 2014-09-03 (×6): 3 g via INTRAVENOUS
  Filled 2014-09-01 (×11): qty 3

## 2014-09-01 MED ORDER — KETOROLAC TROMETHAMINE 30 MG/ML IJ SOLN
30.0000 mg | Freq: Once | INTRAMUSCULAR | Status: AC
  Administered 2014-09-01: 30 mg via INTRAVENOUS
  Filled 2014-09-01: qty 1

## 2014-09-01 MED ORDER — SODIUM CHLORIDE 0.9 % IV BOLUS (SEPSIS)
1000.0000 mL | Freq: Once | INTRAVENOUS | Status: AC
  Administered 2014-09-01: 1000 mL via INTRAVENOUS

## 2014-09-01 MED ORDER — MORPHINE SULFATE 4 MG/ML IJ SOLN
4.0000 mg | Freq: Once | INTRAMUSCULAR | Status: AC
  Administered 2014-09-01: 4 mg via INTRAVENOUS
  Filled 2014-09-01: qty 1

## 2014-09-01 MED ORDER — FERROUS SULFATE 325 (65 FE) MG PO TABS
325.0000 mg | ORAL_TABLET | Freq: Two times a day (BID) | ORAL | Status: DC
  Filled 2014-09-01: qty 1

## 2014-09-01 MED ORDER — IOHEXOL 300 MG/ML  SOLN
75.0000 mL | Freq: Once | INTRAMUSCULAR | Status: AC | PRN
  Administered 2014-09-01: 75 mL via INTRAVENOUS

## 2014-09-01 MED ORDER — SODIUM CHLORIDE 0.9 % IV SOLN
3.0000 g | Freq: Once | INTRAVENOUS | Status: AC
  Administered 2014-09-01: 3 g via INTRAVENOUS
  Filled 2014-09-01: qty 3

## 2014-09-01 MED ORDER — OXYCODONE HCL 5 MG PO TABS
5.0000 mg | ORAL_TABLET | ORAL | Status: DC | PRN
Start: 2014-09-01 — End: 2014-09-03
  Administered 2014-09-01 – 2014-09-03 (×11): 10 mg via ORAL
  Filled 2014-09-01 (×11): qty 2

## 2014-09-01 MED ORDER — ACETAMINOPHEN 650 MG RE SUPP: 650.0000 mg | Freq: Once | RECTAL | Status: DC

## 2014-09-01 MED ORDER — FERROUS SULFATE 325 (65 FE) MG PO TABS
325.0000 mg | ORAL_TABLET | Freq: Three times a day (TID) | ORAL | Status: DC
  Administered 2014-09-01 – 2014-09-03 (×8): 325 mg via ORAL
  Filled 2014-09-01 (×7): qty 1

## 2014-09-01 MED ORDER — ACETAMINOPHEN 650 MG RE SUPP: 975.0000 mg | Freq: Once | RECTAL | Status: DC

## 2014-09-01 NOTE — ED Provider Notes (Signed)
MSE was initiated and I personally evaluated the patient and placed orders (if any) at  12:55 AM on September 01, 2014.  Meagan Nelson is a 39 y.o. female presents with left-sided facial swelling and pain. She reports that her tooth was broken approximately 6 months ago. She reports that 3 days ago she began hurting and her face has begun to swell. She has taken hydrocodone without relief. Patient is tearful. At triage patient has fever of 101.5. She is tachycardic. On my exam she has increased warmth, erythema and swelling to the left side of the face, left mandible into the soft tissues under the left mandible. She is unable to open her jaw at all for exam.  Pt will be moved to the acute side for bloodwork, CT scan and further evaluation.    BP 146/90 mmHg  Pulse 119  Temp(Src) 101.5 F (38.6 C) (Oral)  Resp 16  SpO2 100%  LMP 08/03/2014   The patient appears stable so that the remainder of the MSE may be completed by another provider.  Dahlia Client Nataki Mccrumb, PA-C 09/01/14 8250  Tomasita Crumble, MD 09/01/14 [phone]

## 2014-09-01 NOTE — Progress Notes (Signed)
Pt arrived to floor to room 4N09. Ambulated from stretcher to bed. Alert and orientedx4. Oriented to room and equipment. Will continue to monitor.

## 2014-09-01 NOTE — Consult Note (Signed)
DENTAL CONSULTATION  Date of Consultation:  09/01/2014 Patient Name:   Meagan Nelson Date of Birth:   1975-09-11 Medical Record Number: [id number]  VITALS: BP 137/85 mmHg  Pulse 86  Temp(Src) 99 F (37.2 C) (Other (Comment))  Resp 18  Ht  (1.651 m)  Wt 230 lb 6.1 oz (104.5 kg)  BMI 38.34 kg/m2  SpO2 100%  LMP 08/03/2014  CHIEF COMPLAINT: The patient was referred for evaluation of right facial swelling.  HPI: Alaija Nelson is a 39 year old female referred by Dr. Gwenlyn Perking for dental consultation. Patient with history of right facial swelling and acute dental pain. Patient was admitted by hospitalist team thru the Emergency room and placed on IV Unasyn antibiotic therapy. Patient was referred for evaluation for dental etiology for right facial swelling and to provide dental treatment as indicated.  The patient has a history of breaking off the lower left molar approximately one month ago. Patient subsequently experienced dental pain that was acute, sharp, constant at a 10 out of 10 in intensity. Patient subsequently developed left facial swelling and presented to the emergency room for evaluation. Patient was admitted and placed on IV antibiotic therapy. Pain is currently 0 out of 10 in intensity with recent Toradol administration.  The patient last saw a Dentist approximately one year ago for an exam, cleaning, and extraction of tooth #32. This was with a dentist in [place], [place]. Patient denies having any complications from that dental extraction. Patient indicates that she recently lost her dental insurance and was waiting for new dental insurance to go into effect before seeking dental care.  PROBLEM LIST: Patient Active Problem List   Diagnosis Date Noted  . Symptomatic anemia 09/01/2014  . Microcytic hypochromic anemia 09/01/2014  . Dental infection 09/01/2014    PMH: Past Medical History  Diagnosis Date  . Hypertension     PSH: History reviewed. No pertinent  past surgical history.  ALLERGIES: No Known Allergies  MEDICATIONS: Current Facility-Administered Medications  Medication Dose Route Frequency Provider Last Rate Last Dose  . Ampicillin-Sulbactam (UNASYN) 3 g in sodium chloride 0.9 % 100 mL IVPB  3 g Intravenous Q8H Titus Mould, RPH      . ferrous sulfate tablet 325 mg  325 mg Oral TID WC Vassie Loll, MD   325 mg at 09/01/14 4098  . morphine 2 MG/ML injection 2-4 mg  2-4 mg Intravenous Q4H PRN Hillary Bow, DO   4 mg at 09/01/14 0837  . oxyCODONE (Oxy IR/ROXICODONE) immediate release tablet 5-10 mg  5-10 mg Oral Q4H PRN Vassie Loll, MD   10 mg at 09/01/14 1048    LABS: Lab Results  Component Value Date   WBC 5.3 09/01/2014   HGB 6.7* 09/01/2014   HCT 23.4* 09/01/2014   MCV 58.8* 09/01/2014   PLT 121* 09/01/2014      Component Value Date/Time   NA 141 09/01/2014 0222   K 3.5 09/01/2014 0222   CL 109 09/01/2014 0222   CO2 19* 09/01/2014 0210   GLUCOSE 101* 09/01/2014 0222   BUN 6 09/01/2014 0222   CREATININE 0.60 09/01/2014 0222   CALCIUM 8.0* 09/01/2014 0210   GFRNONAA >60 09/01/2014 0210   GFRAA >60 09/01/2014 0210   No results found for: INR, PROTIME No results found for: PTT  SOCIAL HISTORY: History   Social History  . Marital Status: Single    Spouse Name: N/A  . Number of Children: N/A  . Years of Education: N/A   Occupational  History  . Not on file.   Social History Main Topics  . Smoking status: Former Games developer  . Smokeless tobacco: Not on file  . Alcohol Use: No  . Drug Use: No  . Sexual Activity: Not on file   Other Topics Concern  . Not on file   Social History Narrative    FAMILY HISTORY: History reviewed. No pertinent family history.  REVIEW OF SYSTEMS: Reviewed from chart for this admission.  DENTAL HISTORY: CHIEF COMPLAINT: The patient was referred for evaluation of right facial swelling.  HPI: Meagan Nelson is a 39 year old female referred by Dr. Gwenlyn Perking for dental  consultation. Patient with history of right facial swelling and acute dental pain. Patient was admitted by hospitalist team thru the Emergency room and placed on IV Unasyn antibiotic therapy. Patient was referred for evaluation for dental etiology for right facial swelling and to provide dental treatment as indicated.  The patient has a history of breaking off the lower left molar approximately one month ago. Patient subsequently experienced dental pain that was acute, sharp, constant at a 10 out of 10 in intensity. Patient subsequently developed left facial swelling and presented to the emergency room for evaluation. Patient was admitted and placed on IV antibiotic therapy. Pain is currently 0 out of 10 in intensity with recent Toradol administration.  The patient last saw a Dentist approximately one year ago for an exam, cleaning, and extraction of tooth #32. This was with a dentist in [place], [place]. Patient denies having any complications from that dental extraction. Patient indicates that she recently lost her dental insurance and was waiting for new dental insurance to go into effect before seeking dental care.  DENTAL EXAMINATION: GENERAL: Patient is a well-developed, well-nourished female in no acute distress. HEAD AND NECK: Patient has some left facial swelling. There is left submandibular lymphadenopathy. This area is tender to palpation. INTRAORAL EXAM: The patient has normal saliva. There may be some residual buccal swelling in the area of #19. Patient has a maximum interincisal opening of approximately 25 mm. DENTITION: patient is missing tooth #32. Patient has retained root segments in the area tooth numbers 1, 2, 4, 15, 16. PERIODONTAL: The patient has chronic periodontitis with plaque and calculus accumulations, selective areas gingival recession and incipient tooth mobility. DENTAL CARIES/SUBOPTIMAL RESTORATIONS: The patient has multiple dental caries. ENDODONTIC: The patient  has a history of acute pulpitis symptoms involving the lower left molar #19. Patient has periapical pathology and radiolucency associated with tooth numbers 1, 2, 4, 15, 16, and 19. CROWN AND BRIDGE: There are no crown or bridge restorations. PROSTHODONTIC: There are no partial dentures. OCCLUSION: The patient has a poor occlusal scheme but a stable occlusion.  RADIOGRAPHIC INTERPRETATION:  Orthopantogram was taken on 09/01/2014. There is missing tooth #32. Patient has retained root segments in the area tooth numbers 1, 2, 4, 15, 16. There are extensive dental caries noted. There is incipient to moderate bone loss. There is periapical pathology and radiolucency associated with tooth numbers 1, 2, 5, 15, 16, and 19. I would need a full series of periapical radiographs to rule out other periapical pathology.  ASSESSMENTS: 1. History of left facial swelling 2. Acute pulpitis 3. Chronic apical periodontitis 4. Multiple retained root segments 5. Dental caries 6. Missing tooth #32 7. Supra-eruption and drifting of the unopposed teeth into the edentulous areas 8. Malocclusion 9. Chronic periodontitis of bone loss 10. Selective areas of gingival recession 11. Tooth mobility   PLAN/RECOMMENDATIONS: 1. I discussed the  risks, benefits, and complications of various treatment options with the patient in relationship to the medical and dental conditions. We discussed various treatment options to include no treatment, multiple extractions with alveoloplasty, pre-prosthetic surgery as indicated, periodontal therapy, dental restorations, root canal therapy, crown and bridge therapy, implant therapy, and replacement of missing teeth as indicated.  We also discussed referral to an oral surgeon for definitive dental extractions.The patient currently wishes to proceed with extraction of indicated teeth with alveoloplasty in the operating room with general anesthesia on Wednesday, 09/06/2014 at 8:30 AM at [facility]. After discussion with Dr. Gwenlyn Perking, it was determined that patient may be discharged prior to that date with oral antibiotic therapy and operating procedure will be done on an outpatient basis as indicated. The patient will then follow the dentist of her choice for exam, radiographs, and discussion of other comprehensive dental treatment needs at that time.  2. Discussion of findings with medical team and coordination of future medical and dental care as needed.      Charlynne Pander, DDS

## 2014-09-01 NOTE — H&P (Signed)
[facility] History and Physical  Teyonna Plaisted ZOX:[id number] DOB: April 12, 1975 DOA: 09/01/2014  Referring physician: EDP PCP: No primary care provider on file.   Chief Complaint: Oral swelling   HPI: Meagan Nelson is a 39 y.o. female who presents to the ED with c/o 10/10, constant, oral pain.  Onset 1 week ago and worsening since then.  Broke her left lower tooth about 6 months ago.  Pain worse with palpation of the area, radiates to neck and jaw.  Moderate left sided facial swelling onset yesterday.  Review of Systems: Admits to craving and eating Ice, Systems reviewed.  As above, otherwise negative  Past Medical History  Diagnosis Date  . Hypertension    History reviewed. No pertinent past surgical history. Social History:  reports that she has quit smoking. She does not have any smokeless tobacco history on file. She reports that she does not drink alcohol or use illicit drugs.  No Known Allergies  History reviewed. No pertinent family history.   Prior to Admission medications   Medication Sig Start Date End Date Taking? Authorizing Provider  HYDROcodone-acetaminophen (NORCO/VICODIN) 5-325 MG per tablet Take 1-2 tablets by mouth every 4 (four) hours as needed. Patient not taking: Reported on 09/01/2014 05/17/14   Oswaldo Conroy, PA-C  naproxen (NAPROSYN) 500 MG tablet Take 1 tablet (500 mg total) by mouth 2 (two) times daily. Patient not taking: Reported on 09/01/2014 03/16/14   Vanetta Mulders, MD  penicillin v potassium (VEETID) 500 MG tablet Take 1 tablet (500 mg total) by mouth 4 (four) times daily. Patient not taking: Reported on 09/01/2014 03/16/14   Vanetta Mulders, MD   Physical Exam: Filed Vitals:   09/01/14 0315  BP:   Pulse: 102  Temp:   Resp:     BP 146/88 mmHg  Pulse 102  Temp(Src) 98.5 F (36.9 C) (Oral)  Resp 16  SpO2 100%  LMP 08/03/2014  General Appearance:    Alert, oriented, no distress, appears stated age  Head:    Normocephalic, atraumatic   Eyes:    PERRL, EOMI, sclera non-icteric        Nose:   Nares without drainage or epistaxis. Mucosa, turbinates normal  Throat:   Moist mucous membranes. Oropharynx without erythema or exudate.  Neck:   Supple. No carotid bruits.  No thyromegaly.  No lymphadenopathy.   Back:     No CVA tenderness, no spinal tenderness  Lungs:     Clear to auscultation bilaterally, without wheezes, rhonchi or rales  Chest wall:    No tenderness to palpitation  Heart:    Regular rate and rhythm without murmurs, gallops, rubs  Abdomen:     Soft, non-tender, nondistended, normal bowel sounds, no organomegaly  Genitalia:    deferred  Rectal:    deferred  Extremities:   No clubbing, cyanosis or edema.  Pulses:   2+ and symmetric all extremities  Skin:   Skin color, texture, turgor normal, no rashes or lesions  Lymph nodes:   Cervical, supraclavicular, and axillary nodes normal  Neurologic:   CNII-XII intact. Normal strength, sensation and reflexes      throughout    Labs on Admission:  Basic Metabolic Panel:  Recent Labs Lab 09/01/14 0210 09/01/14 0222  NA 137 141  K 3.4* 3.5  CL 108 109  CO2 19*  --   GLUCOSE 104* 101*  BUN 7 6  CREATININE 0.67 0.60  CALCIUM 8.0*  --    Liver Function Tests: No results for input(s): AST,  ALT, ALKPHOS, BILITOT, PROT, ALBUMIN in the last 168 hours. No results for input(s): LIPASE, AMYLASE in the last 168 hours. No results for input(s): AMMONIA in the last 168 hours. CBC:  Recent Labs Lab 09/01/14 0210 09/01/14 0222  WBC 5.1  --   HGB 5.8* 7.5*  HCT 21.7* 22.0*  MCV 55.8*  --   PLT 137*  --    Cardiac Enzymes: No results for input(s): CKTOTAL, CKMB, CKMBINDEX, TROPONINI in the last 168 hours.  BNP (last 3 results) No results for input(s): PROBNP in the last 8760 hours. CBG: No results for input(s): GLUCAP in the last 168 hours.  Radiological Exams on Admission: Ct Soft Tissue Neck W Contrast  09/01/2014   EXAM: CT NECK WITH CONTRAST  TECHNIQUE:  Multidetector CT imaging of the neck was performed using the standard protocol following the bolus administration of intravenous contrast.  CONTRAST:  72mL OMNIPAQUE IOHEXOL 300 MG/ML  SOLN  COMPARISON:  None.  FINDINGS: The visualized portions of the brain are unremarkable.  Probable retention cyst present within the inferior left maxillary sinus. Visualized paranasal sinuses are otherwise largely clear. No mastoid effusion.  Salivary glands including the parotid glands and submandibular glands are normal.  There is mild inflammatory stranding with swelling within the left masticator space lateral to the left mandibular body. There is an adjacent prominent dental carie within knee left first mandibular molar. No definite loculated or rim enhancing collection to suggest abscess.  Oral cavity within normal limits. Palatine tonsils are normal. Parapharyngeal fat is preserved. Nasopharynx and oropharynx within normal limits. No retropharyngeal fluid collection. Epiglottis is normal. Vallecula is clear. Hypopharynx and supraglottic larynx are within normal limits. True vocal cords are symmetric bilaterally. Subglottic airway is clear  Mildly enlarged left level 1 B lymph nodes measure up to 1 cm in short access, likely reactive. No other pathologically enlarged lymph nodes identified within the neck.  Thyroid gland is normal.  Visualized superior mediastinum within normal limits.  Partially visualized lungs are clear.  Normal intravascular enhancement seen within the neck.  No acute osseous abnormality. No worrisome lytic or blastic osseous lesions.  IMPRESSION: 1. Mild soft tissue swelling with inflammatory stranding within the left face lateral to the left mandibular body, suggestive of cellulitis. An odontogenic origin is suspected. No loculated or drainable fluid collection to suggest abscess. 2. Mildly prominent left level 1 B adenopathy, likely reactive.   Electronically Signed   By: Rise Mu M.D.    On: 09/01/2014 02:26    EKG: Independently reviewed.  Assessment/Plan Principal Problem:   Dental infection Active Problems:   Symptomatic anemia   Microcytic hypochromic anemia   1. Dental infection - 1. Unasyn per pharm 2. Thankfully no airway involvement or effacement or abscess at this point on CT scan 3. Needs dentistry consult in AM (none on call tonight) 2. Microcytic hypochromic anemia - Chronic Iron deficiency anemia due to not taking iron pills patient states 1. History and lab findings are consistent with this 2. Repeat H/H pending given the 2 different values in the ED 3. Patient would like to avoid transfusion if reasonable and take iron supplementation instead.  Given the very long duration and stability of her iron deficiency anemia (HGB was running in the 7s back in 2011), and given that we know why her anemia persists (she freely admits to not taking her prescribed iron pills at all), I feel that this may be a reasonable approach provided her repeat H/H isnt too low.  4. Iron studies pending 5. Type and screen being done.    Code Status: Full  Family Communication: No family in room Disposition Plan: Admit to obs  Time spent: 70 min  GARDNER, JARED M. [facility] Pager [phone]  If 7AM-7PM, please contact the day team taking care of the patient [email or web address] Password [email or web address] 09/01/2014, 3:31 AM

## 2014-09-01 NOTE — ED Notes (Signed)
The patient said she has been hurting for days and she did not know why.  She has taken medications for it and it is not helping.  It is the left lower side of her mouth.  She rates her pain 10/10.

## 2014-09-01 NOTE — ED Notes (Signed)
MD aware of HGB, repeat to be drawn.

## 2014-09-01 NOTE — Progress Notes (Signed)
Repeat hgb 5.9. On call physician paged. Spoke with patient, agreed with transfusing blood. Orders placed for 1 unit blood.

## 2014-09-01 NOTE — ED Notes (Signed)
Transporting patient to new room assignment. 

## 2014-09-01 NOTE — Progress Notes (Signed)
ANTIBIOTIC CONSULT NOTE - INITIAL  Pharmacy Consult for Unasyn Indication: dental infection  No Known Allergies  Patient Measurements:    Ht: 65 in  Wt: 95 kg   Vital Signs: Temp: 98.5 F (36.9 C) (06/17 0241) Temp Source: Oral (06/17 0241) BP: 146/88 mmHg (06/17 0300) Pulse Rate: 101 (06/17 0330) Intake/Output from previous day:   Intake/Output from this shift:    Labs:  Recent Labs  09/01/14 0210 09/01/14 0222  WBC 5.1  --   HGB 5.8* 7.5*  PLT 137*  --   CREATININE 0.67 0.60   CrCl cannot be calculated (Unknown ideal weight.). No results for input(s): VANCOTROUGH, VANCOPEAK, VANCORANDOM, GENTTROUGH, GENTPEAK, GENTRANDOM, TOBRATROUGH, TOBRAPEAK, TOBRARND, AMIKACINPEAK, AMIKACINTROU, AMIKACIN in the last 72 hours.   Microbiology: No results found for this or any previous visit (from the past 720 hour(s)).  Medical History: Past Medical History  Diagnosis Date  . Hypertension     Medications:  See electronic med rec  Assessment: 39 y.o. female presents with dental pain (s/p broken L lower tooth 6 mos ago). CT reveals cellulitis - no abscess seen. To begin Unasyn. SCr 0.6, est CrCl ~100 ml/min. WBC wnl.  Goal of Therapy:  Resolution of infection  Plan:  Unasyn 3gm IV q8h Will f/u renal function, pt's clinical condition, and micro data  Christoper Fabian, PharmD, BCPS Clinical pharmacist, pager [phone] 09/01/2014,3:34 AM

## 2014-09-01 NOTE — Progress Notes (Signed)
Patient seen and examined. Admitted after midnight secondary to tooth pain, left side facial swelling and anemia. Patient CT neck suggesting left mandibular cellulitis. Patient still with pain 10/10 and unable to chew. Hgb was < 7.0. Please referred to H&P written by Dr. Julian Reil for further info/details on admission.  Plan: -will follow iron panels -follow H&H and if remains < 7.0 will transfuse 1 unit of PRBC -continue ferrous sulfate -patient to have panorex -continue IV antibiotics -supportive care and PRN analgesics   Meagan Nelson [phone]

## 2014-09-01 NOTE — Progress Notes (Signed)
Patient complaining of severe pains after Morphine given. MD notified and order placed and carried out.

## 2014-09-01 NOTE — ED Provider Notes (Addendum)
CSN: [id number]     Arrival date & time 09/01/14  0017 History   This chart was scribed for Meagan Crumble, MD by Freida Busman, ED Scribe. This patient was seen in room A11C/A11C and the patient's care was started 1:05 AM.  Chief Complaint  Patient presents with  . Oral Swelling    The patient said she has been hurting for days and she did not know why.  She has taken medications for it and it is not helping.   The history is provided by the patient. No language interpreter was used.    HPI Comments:  Meagan Nelson is a 39 y.o. female who presents to the Emergency Department complaining of 10/10 constant oral pain that worsened 1 weeks ago. Pt notes she broke her left lower tooth ~54months ago.She notes her pain is exacerbated with palpation of the area and radiates into her neck and jaw. She reports associated moderate left sided facial swelling that started yesterday. She has tried OTC medication without relief. No alleviating factors noted.   Past Medical History  Diagnosis Date  . Hypertension    History reviewed. No pertinent past surgical history. History reviewed. No pertinent family history. History  Substance Use Topics  . Smoking status: Former Games developer  . Smokeless tobacco: Not on file  . Alcohol Use: No   OB History    No data available     Review of Systems  A complete 10 system review of systems was obtained and all systems are negative except as noted in the HPI and PMH.    Allergies  Review of patient's allergies indicates no known allergies.  Home Medications   Prior to Admission medications   Medication Sig Start Date End Date Taking? Authorizing Provider  acetaminophen (TYLENOL) 500 MG tablet Take 500-1,000 mg by mouth every 6 (six) hours as needed for moderate pain.    Historical Provider, MD  HYDROcodone-acetaminophen (NORCO/VICODIN) 5-325 MG per tablet Take 1-2 tablets by mouth every 4 (four) hours as needed. 05/17/14   Oswaldo Conroy, PA-C  naproxen  (NAPROSYN) 500 MG tablet Take 1 tablet (500 mg total) by mouth 2 (two) times daily. 03/16/14   Vanetta Mulders, MD  penicillin v potassium (VEETID) 500 MG tablet Take 1 tablet (500 mg total) by mouth 4 (four) times daily. 03/16/14   Vanetta Mulders, MD   BP 146/90 mmHg  Pulse 119  Temp(Src) 101.5 F (38.6 C) (Oral)  Resp 16  SpO2 100%  LMP 08/03/2014 Physical Exam  Constitutional: She is oriented to person, place, and time. She appears well-developed and well-nourished. No distress.  HENT:  Head: Normocephalic and atraumatic.  Nose: Nose normal.  Mouth/Throat: Oropharynx is clear and moist. No oropharyngeal exudate.  Trisumus  Swelling to left parotid and submandibular area No fluctuance Tooth #36 fractured  Limited exam due to pain  No TTP to floor of the mouth   Eyes: Conjunctivae and EOM are normal. Pupils are equal, round, and reactive to light. No scleral icterus.  Neck: Normal range of motion. Neck supple. No JVD present. No tracheal deviation present. No thyromegaly present.  Cardiovascular: Normal rate, regular rhythm and normal heart sounds.  Exam reveals no gallop and no friction rub.   No murmur heard. Pulmonary/Chest: Effort normal and breath sounds normal. No respiratory distress. She has no wheezes. She exhibits no tenderness.  Abdominal: Soft. Bowel sounds are normal. She exhibits no distension and no mass. There is no tenderness. There is no rebound and no guarding.  Musculoskeletal: Normal range of motion. She exhibits no edema or tenderness.  Lymphadenopathy:    She has no cervical adenopathy.  Neurological: She is alert and oriented to person, place, and time. No cranial nerve deficit. She exhibits normal muscle tone.  Skin: Skin is warm and dry. No rash noted. No erythema. No pallor.  Nursing note and vitals reviewed.   ED Course  Procedures   DIAGNOSTIC STUDIES:  Oxygen Saturation is 100% on RA, normal by my interpretation.    COORDINATION OF  CARE:  1:09 AM Will order fever reducer. Discussed treatment plan with pt at bedside and pt agreed to plan.  Labs Review Labs Reviewed  CBC - Abnormal; Notable for the following:    Hemoglobin 5.8 (*)    HCT 21.7 (*)    MCV 55.8 (*)    MCH 14.9 (*)    MCHC 26.7 (*)    RDW 20.4 (*)    Platelets 137 (*)    All other components within normal limits  BASIC METABOLIC PANEL - Abnormal; Notable for the following:    Potassium 3.4 (*)    CO2 19 (*)    Glucose, Bld 104 (*)    Calcium 8.0 (*)    All other components within normal limits  HEMOGLOBIN AND HEMATOCRIT, BLOOD - Abnormal; Notable for the following:    Hemoglobin 5.9 (*)    HCT 22.0 (*)    All other components within normal limits  IRON AND TIBC - Abnormal; Notable for the following:    Iron 9 (*)    TIBC 503 (*)    Saturation Ratios 2 (*)    All other components within normal limits  FERRITIN - Abnormal; Notable for the following:    Ferritin 5 (*)    All other components within normal limits  I-STAT CHEM 8, ED - Abnormal; Notable for the following:    Glucose, Bld 101 (*)    Hemoglobin 7.5 (*)    HCT 22.0 (*)    All other components within normal limits  I-STAT BETA HCG BLOOD, ED ([facility], [facility], [facility] ONLY)  I-STAT CG4 LACTIC ACID, ED  TYPE AND SCREEN  ABO/RH  PREPARE RBC (CROSSMATCH)    Imaging Review Ct Soft Tissue Neck W Contrast  09/01/2014   EXAM: CT NECK WITH CONTRAST  TECHNIQUE: Multidetector CT imaging of the neck was performed using the standard protocol following the bolus administration of intravenous contrast.  CONTRAST:  51mL OMNIPAQUE IOHEXOL 300 MG/ML  SOLN  COMPARISON:  None.  FINDINGS: The visualized portions of the brain are unremarkable.  Probable retention cyst present within the inferior left maxillary sinus. Visualized paranasal sinuses are otherwise largely clear. No mastoid effusion.  Salivary glands including the parotid glands and submandibular glands are normal.  There is mild inflammatory stranding  with swelling within the left masticator space lateral to the left mandibular body. There is an adjacent prominent dental carie within knee left first mandibular molar. No definite loculated or rim enhancing collection to suggest abscess.  Oral cavity within normal limits. Palatine tonsils are normal. Parapharyngeal fat is preserved. Nasopharynx and oropharynx within normal limits. No retropharyngeal fluid collection. Epiglottis is normal. Vallecula is clear. Hypopharynx and supraglottic larynx are within normal limits. True vocal cords are symmetric bilaterally. Subglottic airway is clear  Mildly enlarged left level 1 B lymph nodes measure up to 1 cm in short access, likely reactive. No other pathologically enlarged lymph nodes identified within the neck.  Thyroid gland is normal.  Visualized  superior mediastinum within normal limits.  Partially visualized lungs are clear.  Normal intravascular enhancement seen within the neck.  No acute osseous abnormality. No worrisome lytic or blastic osseous lesions.  IMPRESSION: 1. Mild soft tissue swelling with inflammatory stranding within the left face lateral to the left mandibular body, suggestive of cellulitis. An odontogenic origin is suspected. No loculated or drainable fluid collection to suggest abscess. 2. Mildly prominent left level 1 B adenopathy, likely reactive.   Electronically Signed   By: Rise Mu M.D.   On: 09/01/2014 02:26     EKG Interpretation None      MDM   Final diagnoses:  None    Patient since emergency department for dental pain has progressed to swelling and possible abscess formation. She states is going on for the last week. She is markedly tender on exam and has trismus. Will evaluate with CT scan for evaluation. Patient is given Tylenol for fever and morphine for pain control as well as Toradol.  CT reveals dental infection that has spread to cellulitis.  No abscess formation seen.  She was given unasyn for  treatment.  Hgb 5.5, will repeat but patient is symptomatic for this as well.  Type and screen sent.  I spoke with Dr. Julian Reil who will admit to observation for management.    I personally performed the services described in this documentation, which was scribed in my presence. The recorded information has been reviewed and is accurate.    Meagan Crumble, MD 09/01/14 [id number]  Meagan Crumble, MD 09/01/14 [phone]

## 2014-09-02 DIAGNOSIS — I10 Essential (primary) hypertension: Secondary | ICD-10-CM

## 2014-09-02 DIAGNOSIS — D649 Anemia, unspecified: Secondary | ICD-10-CM

## 2014-09-02 DIAGNOSIS — Z720 Tobacco use: Secondary | ICD-10-CM

## 2014-09-02 LAB — CBC
HCT: 23.1 % — ABNORMAL LOW (ref 36.0–46.0)
Hemoglobin: 6.5 g/dL — CL (ref 12.0–15.0)
MCH: 16.3 pg — AB (ref 26.0–34.0)
MCHC: 28.1 g/dL — ABNORMAL LOW (ref 30.0–36.0)
MCV: 57.9 fL — AB (ref 78.0–100.0)
Platelets: 125 10*3/uL — ABNORMAL LOW (ref 150–400)
RBC: 3.99 MIL/uL (ref 3.87–5.11)
RDW: 22.8 % — ABNORMAL HIGH (ref 11.5–15.5)
WBC: 6.9 10*3/uL (ref 4.0–10.5)

## 2014-09-02 LAB — PREPARE RBC (CROSSMATCH)

## 2014-09-02 MED ORDER — SODIUM CHLORIDE 0.9 % IV SOLN
25.0000 mg | Freq: Once | INTRAVENOUS | Status: AC
  Administered 2014-09-02: 25 mg via INTRAVENOUS
  Filled 2014-09-02: qty 0.5

## 2014-09-02 MED ORDER — SODIUM CHLORIDE 0.9 % IV SOLN
1000.0000 mg | Freq: Once | INTRAVENOUS | Status: AC
  Administered 2014-09-02: 1000 mg via INTRAVENOUS
  Filled 2014-09-02 (×2): qty 20

## 2014-09-02 MED ORDER — FUROSEMIDE 10 MG/ML IJ SOLN
20.0000 mg | Freq: Once | INTRAMUSCULAR | Status: AC
  Administered 2014-09-02: 20 mg via INTRAVENOUS
  Filled 2014-09-02: qty 4

## 2014-09-02 MED ORDER — DIPHENHYDRAMINE HCL 25 MG PO CAPS
25.0000 mg | ORAL_CAPSULE | Freq: Four times a day (QID) | ORAL | Status: DC | PRN
  Administered 2014-09-02 (×2): 25 mg via ORAL
  Filled 2014-09-02 (×2): qty 1

## 2014-09-02 MED ORDER — SODIUM CHLORIDE 0.9 % IV SOLN
Freq: Once | INTRAVENOUS | Status: AC
  Administered 2014-09-02: 19:00:00 via INTRAVENOUS

## 2014-09-02 NOTE — Consult Note (Signed)
Pharmacy Consult - IV Iron  Pharmacy consulted to dose IV iron for patient with iron deficiency anemia. Had been started on ferrous sulfate 325mg  po TID. Pt has received 1 unit PRBC on 6/17 (transfusing for Hgb < 7, Hgb was 6.7). Plt 121.  Iron panel completed 6/17: Tsat 2%, ferritin 5 - both extremely low.    Plan: - Initiate InFed test dose 25mg  IV x1 and observe patient for 1 hr - If patient tolerates, give full dose of InFed 1g IV x1   Nashton Belson E. Seann Genther, Pharm.D Clinical Pharmacy Resident Pager: [phone] 09/02/2014 9:06 AM

## 2014-09-02 NOTE — Progress Notes (Signed)
Pt's IV site in left ac was pulled out accidentally by patient.  Blood transfusion paused until IV team was able to start new IV. Blood bank notified and advised RN to continue blood until 2212. Pt received 270 mls of blood. Will continue to monitor. Gara Kroner, RN

## 2014-09-02 NOTE — Progress Notes (Signed)
Test dose of Fe given without reaction. Pt afebrile. Pharmacy notified to send full dose.

## 2014-09-02 NOTE — Progress Notes (Signed)
[facility] PROGRESS NOTE  Meagan Nelson HVF:[id number] DOB: 1975/03/29 DOA: 09/01/2014 PCP: No primary care provider on file.  Assessment/Plan: 1-mandibular cellulitis, periodontitis and acute pulpitis: with left facial swelling and pain -improved with PRN analgesics medications and antibiotics -patient afebrile and swelling has gone down -will advance diet and if tolerate it will discharge home on 6/19 on oral abx's -patient will follow with dentist (Dr. Kristin Bruins) on 6/22 for teeth extraction and alveoloplasty under general anesthesia  2-iron deficiency anemia: after discussing with patient she has been experiencing increase fatigue and some weakness. Most likely due to symptomatic anemia -no signs of overt bleeding -neg FOBT -will transfuse 2 more units of PRBC's -IV iron per pharmacy dosing -continue ferrous sulfate -looking for Hgb at least 8 (with anticipated surgery on 6/22)  -patient in agreement  3-HTN: stable. Was following low sodium diet and not taking any meds at home -will monitor -pain contributing to certain increase of BP currently  4-hx of tobacco abuse: patient reported that she has quitted smoking -congratulated and encourage to keep herself smoking free (as tobacco is a high risk factor for poor dentition)  Code Status: Full Family Communication: husband at bedside Disposition Plan: remains inpatient; will transfuse further PRBC's and give iron infusion; will advance diet. If blood-work improved with transfusions and able to tolerate diet and meds by mouth, will discharge home on 6/19 and she will returned on 6/22 in am for extraction of indicated teeth with alveoloplasty in the operating room with general anesthesia   Consultants:  Dr. Kristin Bruins (Dental medicine)  Procedures:  See below for x-ray results   Antibiotics:  Ampicillin 6/17  HPI/Subjective: Left facial swelling improved; no fever, no nausea/vomiting and with controlled oral pain.  Patient denies CP and SOB. Hgb still very low and results demonstrating severely depleted iron levels  Objective: Filed Vitals:   09/02/14 1450  BP: 115/71  Pulse: 87  Temp: 98.4 F (36.9 C)  Resp: 18   No intake or output data in the 24 hours ending 09/02/14 1721 Filed Weights   09/01/14 0425  Weight: 104.5 kg (230 lb 6.1 oz)    Exam:   General:  Afebrile, feeling somewhat better and w/o nausea, vomiting or abd pain. Oral pain is much better and patient is hungry  Cardiovascular: S1 and S2, no rubs or gallops  Respiratory: CTA bilaterally   Abdomen: soft, NT, ND, positive BS  Musculoskeletal: no edema, no cyanosis   Data Reviewed: Basic Metabolic Panel:  Recent Labs Lab 09/01/14 0210 09/01/14 0222  NA 137 141  K 3.4* 3.5  CL 108 109  CO2 19*  --   GLUCOSE 104* 101*  BUN 7 6  CREATININE 0.67 0.60  CALCIUM 8.0*  --    CBC:  Recent Labs Lab 09/01/14 0210 09/01/14 0222 09/01/14 0308 09/01/14 1135 09/02/14 0932  WBC 5.1  --   --  5.3 6.9  HGB 5.8* 7.5* 5.9* 6.7* 6.5*  HCT 21.7* 22.0* 22.0* 23.4* 23.1*  MCV 55.8*  --   --  58.8* 57.9*  PLT 137*  --   --  121* 125*    Studies: Dg Orthopantogram  09/01/2014   CLINICAL DATA:  Left mandible pain, fever, and toothache.  EXAM: ORTHOPANTOGRAM/PANORAMIC  COMPARISON:  None.  FINDINGS: A large carie is seen involving the left mandibular first molar. Mild associated periapical lucency is also seen. No evidence of other mandibular bone lesions. No mandible fracture identified.  There also probable caries and periapical lucency involving  several bilateral maxillary molars.  IMPRESSION: Large carie involving the left mandibular first molar with mild associated periapical lucency.  Probable caries and periapical lucency involving several bilateral maxillary molars.  Consider dental referral and dedicated dental radiographs for further evaluation.   Electronically Signed   By: Myles Rosenthal M.D.   On: 09/01/2014 09:22   Ct  Soft Tissue Neck W Contrast  09/01/2014   EXAM: CT NECK WITH CONTRAST  TECHNIQUE: Multidetector CT imaging of the neck was performed using the standard protocol following the bolus administration of intravenous contrast.  CONTRAST:  75mL OMNIPAQUE IOHEXOL 300 MG/ML  SOLN  COMPARISON:  None.  FINDINGS: The visualized portions of the brain are unremarkable.  Probable retention cyst present within the inferior left maxillary sinus. Visualized paranasal sinuses are otherwise largely clear. No mastoid effusion.  Salivary glands including the parotid glands and submandibular glands are normal.  There is mild inflammatory stranding with swelling within the left masticator space lateral to the left mandibular body. There is an adjacent prominent dental carie within knee left first mandibular molar. No definite loculated or rim enhancing collection to suggest abscess.  Oral cavity within normal limits. Palatine tonsils are normal. Parapharyngeal fat is preserved. Nasopharynx and oropharynx within normal limits. No retropharyngeal fluid collection. Epiglottis is normal. Vallecula is clear. Hypopharynx and supraglottic larynx are within normal limits. True vocal cords are symmetric bilaterally. Subglottic airway is clear  Mildly enlarged left level 1 B lymph nodes measure up to 1 cm in short access, likely reactive. No other pathologically enlarged lymph nodes identified within the neck.  Thyroid gland is normal.  Visualized superior mediastinum within normal limits.  Partially visualized lungs are clear.  Normal intravascular enhancement seen within the neck.  No acute osseous abnormality. No worrisome lytic or blastic osseous lesions.  IMPRESSION: 1. Mild soft tissue swelling with inflammatory stranding within the left face lateral to the left mandibular body, suggestive of cellulitis. An odontogenic origin is suspected. No loculated or drainable fluid collection to suggest abscess. 2. Mildly prominent left level 1 B  adenopathy, likely reactive.   Electronically Signed   By: Rise Mu M.D.   On: 09/01/2014 02:26    Scheduled Meds: . sodium chloride   Intravenous Once  . ampicillin-sulbactam (UNASYN) IV  3 g Intravenous Q8H  . ferrous sulfate  325 mg Oral TID WC  . furosemide  20 mg Intravenous Once  . iron dextran (INFED/DEXFERRUM) infusion  1,000 mg Intravenous Once   Continuous Infusions:   Principal Problem:   Dental infection Active Problems:   Symptomatic anemia   Microcytic hypochromic anemia    Time spent: 35 minutes    Vassie Loll  [facility] Pager [phone]. If 7PM-7AM, please contact night-coverage at [email or web address], password [facility] 09/02/2014, 5:21 PM  LOS: 1 day

## 2014-09-03 DIAGNOSIS — L03211 Cellulitis of face: Secondary | ICD-10-CM

## 2014-09-03 LAB — CBC
HCT: 28.2 % — ABNORMAL LOW (ref 36.0–46.0)
HEMOGLOBIN: 8.2 g/dL — AB (ref 12.0–15.0)
MCH: 18 pg — ABNORMAL LOW (ref 26.0–34.0)
MCHC: 29.1 g/dL — AB (ref 30.0–36.0)
MCV: 61.8 fL — ABNORMAL LOW (ref 78.0–100.0)
Platelets: 121 10*3/uL — ABNORMAL LOW (ref 150–400)
RBC: 4.56 MIL/uL (ref 3.87–5.11)
RDW: 27 % — ABNORMAL HIGH (ref 11.5–15.5)
WBC: 7.5 10*3/uL (ref 4.0–10.5)

## 2014-09-03 MED ORDER — FERROUS SULFATE 325 (65 FE) MG PO TABS: 325.0000 mg | ORAL_TABLET | Freq: Three times a day (TID) | ORAL | Status: DC

## 2014-09-03 MED ORDER — HYDROCODONE-ACETAMINOPHEN 5-325 MG PO TABS: 1.0000 | ORAL_TABLET | Freq: Four times a day (QID) | ORAL | Status: DC | PRN

## 2014-09-03 MED ORDER — AMOXICILLIN-POT CLAVULANATE 875-125 MG PO TABS: 1.0000 | ORAL_TABLET | Freq: Two times a day (BID) | ORAL | Status: DC

## 2014-09-03 NOTE — Progress Notes (Signed)
Patient is being d/c, d/c instruction given and patient verbalized understanding. Condition stable.

## 2014-09-03 NOTE — Discharge Summary (Signed)
Physician Discharge Summary  Meagan Nelson ZOX:[id number] DOB: 10-28-1975 DOA: 09/01/2014  PCP: No primary care provider on file.  Admit date: 09/01/2014 Discharge date: 09/03/2014  Time spent: >30 minutes  Recommendations for Outpatient Follow-up:  1. Check CBC to follow Hgb trend 2. Repeat BMET to follow electrolytes and renal function 3. Reassess BP and initiate antihypertensive regimen if needed  Discharge Diagnoses:  Dental infection Symptomatic anemia Tobacco abuse; hx  Discharge Condition: stable and improved. Will discharge home on PO antibiotics and she would follow with Dental Medicine on Wednesday 6/22 for teeth extraction and alveoloplasty as discussed.  Diet recommendation: heart healthy diet  Filed Weights   09/01/14 0425  Weight: 104.5 kg (230 lb 6.1 oz)    History of present illness:  39 y.o. female who presents to the ED with c/o 10/10, constant, oral pain. Onset 1 week ago and worsening since then. Broke her left lower tooth about 6 months ago. Pain worse with palpation of the area, radiates to neck and jaw. Moderate left sided facial swelling onset yesterday.  Hospital Course:  1-mandibular cellulitis, periodontitis and acute pulpitis: with left facial swelling and pain -improved with PRN analgesics medications and antibiotics -patient afebrile and swelling has gone down. Now able to take PO's meds and tolerating soft diet -will discharge home on 6/19 on oral abx's to complete 10 more days of treatment -patient will follow with dentist (Dr. Kristin Bruins) on 6/22 for teeth extraction and alveoloplasty under general anesthesia  2-iron deficiency anemia: after discussing with patient she has been experiencing increase fatigue and some weakness. Most likely due to symptomatic anemia. Hgb 5.9 on admission -no signs of overt bleeding -neg FOBT -Patient received a total of 3 units of PRBC's -IV iron per pharmacy dosing also given -will continue ferrous sulfate  TID -at discharge Hgb 8.2 -will need CBC to follow Hgb trend  3-HTN: stable. Was following low sodium diet and not taking any meds at home -will monitor -pain contributing to certain increase of BP currently -close follow up with PCP to start antihypertensive drug whe appropriate  4-hx of tobacco abuse: patient reported that she has quitted smoking -congratulated and encourage to keep herself smoking free (as tobacco is a high risk factor for poor dentition)  Procedures:  See below for x-reports   Consultations:  Dental Medicine  Discharge Exam: Filed Vitals:   09/03/14 1018  BP: 143/88  Pulse: 83  Temp:   Resp: 20    General: Afebrile, no nausea, vomiting or significant difficulty swallowing. Oral pain is a whole lot better and patient is hungry  Cardiovascular: S1 and S2, no rubs or gallops  Respiratory: CTA bilaterally   Abdomen: soft, NT, ND, positive BS  Musculoskeletal: no edema, no cyanosis   Discharge Instructions   Discharge Instructions    Diet - low sodium heart healthy    Complete by:  As directed      Discharge instructions    Complete by:  As directed   Follow soft diet Take medications as prescribed Please make sure you follow with Dr. Kristin Bruins as instructed (6/22) Please keep yourself well hydrated          Current Discharge Medication List    START taking these medications   Details  amoxicillin-clavulanate (AUGMENTIN) 875-125 MG per tablet Take 1 tablet by mouth 2 (two) times daily. Qty: 20 tablet, Refills: 0    ferrous sulfate 325 (65 FE) MG tablet Take 1 tablet (325 mg total) by mouth 3 (three) times  daily with meals. Qty: 90 tablet, Refills: 3      CONTINUE these medications which have CHANGED   Details  HYDROcodone-acetaminophen (NORCO/VICODIN) 5-325 MG per tablet Take 1-2 tablets by mouth every 6 (six) hours as needed for severe pain. Qty: 30 tablet, Refills: 0      STOP taking these medications     naproxen (NAPROSYN)  500 MG tablet      penicillin v potassium (VEETID) 500 MG tablet        No Known Allergies Follow-up Information    Follow up with Charlynne Pander, DDS. Go on 09/06/2014.   Specialty:  Dentistry   Why:  appointment at 8:30 [facility] for teeth extraction and alveoplasty as previously discussed    Contact information:   [address] [place] [address] [phone]       The results of significant diagnostics from this hospitalization (including imaging, microbiology, ancillary and laboratory) are listed below for reference.    Significant Diagnostic Studies: Dg Orthopantogram  09/01/2014   CLINICAL DATA:  Left mandible pain, fever, and toothache.  EXAM: ORTHOPANTOGRAM/PANORAMIC  COMPARISON:  None.  FINDINGS: A large carie is seen involving the left mandibular first molar. Mild associated periapical lucency is also seen. No evidence of other mandibular bone lesions. No mandible fracture identified.  There also probable caries and periapical lucency involving several bilateral maxillary molars.  IMPRESSION: Large carie involving the left mandibular first molar with mild associated periapical lucency.  Probable caries and periapical lucency involving several bilateral maxillary molars.  Consider dental referral and dedicated dental radiographs for further evaluation.   Electronically Signed   By: Myles Rosenthal M.D.   On: 09/01/2014 09:22   Ct Soft Tissue Neck W Contrast  09/01/2014   EXAM: CT NECK WITH CONTRAST  TECHNIQUE: Multidetector CT imaging of the neck was performed using the standard protocol following the bolus administration of intravenous contrast.  CONTRAST:  98mL OMNIPAQUE IOHEXOL 300 MG/ML  SOLN  COMPARISON:  None.  FINDINGS: The visualized portions of the brain are unremarkable.  Probable retention cyst present within the inferior left maxillary sinus. Visualized paranasal sinuses are otherwise largely clear. No mastoid effusion.  Salivary glands including the parotid  glands and submandibular glands are normal.  There is mild inflammatory stranding with swelling within the left masticator space lateral to the left mandibular body. There is an adjacent prominent dental carie within knee left first mandibular molar. No definite loculated or rim enhancing collection to suggest abscess.  Oral cavity within normal limits. Palatine tonsils are normal. Parapharyngeal fat is preserved. Nasopharynx and oropharynx within normal limits. No retropharyngeal fluid collection. Epiglottis is normal. Vallecula is clear. Hypopharynx and supraglottic larynx are within normal limits. True vocal cords are symmetric bilaterally. Subglottic airway is clear  Mildly enlarged left level 1 B lymph nodes measure up to 1 cm in short access, likely reactive. No other pathologically enlarged lymph nodes identified within the neck.  Thyroid gland is normal.  Visualized superior mediastinum within normal limits.  Partially visualized lungs are clear.  Normal intravascular enhancement seen within the neck.  No acute osseous abnormality. No worrisome lytic or blastic osseous lesions.  IMPRESSION: 1. Mild soft tissue swelling with inflammatory stranding within the left face lateral to the left mandibular body, suggestive of cellulitis. An odontogenic origin is suspected. No loculated or drainable fluid collection to suggest abscess. 2. Mildly prominent left level 1 B adenopathy, likely reactive.   Electronically Signed   By: Rise Mu  M.D.   On: 09/01/2014 02:26    Labs: Basic Metabolic Panel:  Recent Labs Lab 09/01/14 0210 09/01/14 0222  NA 137 141  K 3.4* 3.5  CL 108 109  CO2 19*  --   GLUCOSE 104* 101*  BUN 7 6  CREATININE 0.67 0.60  CALCIUM 8.0*  --    CBC:  Recent Labs Lab 09/01/14 0210 09/01/14 0222 09/01/14 0308 09/01/14 1135 09/02/14 0932 09/03/14 0553  WBC 5.1  --   --  5.3 6.9 7.5  HGB 5.8* 7.5* 5.9* 6.7* 6.5* 8.2*  HCT 21.7* 22.0* 22.0* 23.4* 23.1* 28.2*  MCV  55.8*  --   --  58.8* 57.9* 61.8*  PLT 137*  --   --  121* 125* 121*    Signed:  Vassie Loll  [facility] 09/03/2014, 10:40 AM

## 2014-09-04 ENCOUNTER — Other Ambulatory Visit (HOSPITAL_COMMUNITY): Payer: Self-pay | Admitting: Dentistry

## 2014-09-04 LAB — TYPE AND SCREEN
ABO/RH(D): B POS
ANTIBODY SCREEN: NEGATIVE
UNIT DIVISION: 0
Unit division: 0
Unit division: 0

## 2014-09-05 ENCOUNTER — Encounter (HOSPITAL_COMMUNITY): Payer: Self-pay | Admitting: *Deleted

## 2014-09-05 MED ORDER — CEFAZOLIN SODIUM-DEXTROSE 2-3 GM-% IV SOLR
2.0000 g | INTRAVENOUS | Status: AC
  Filled 2014-09-05: qty 50

## 2014-09-05 NOTE — Progress Notes (Signed)
Pt denies SOB, chest pain, and being under the care of a cardiologist. Pt denies having a chest x ray and EKG within the last year. Pt denies having a stress test, echo and cardiac cath. Pt made aware to stop  taking Aspirin, otc vitamins and herbal medications. Do not take any NSAIDs ie: Ibuprofen, Advil, Naproxen or any medication containing Aspirin. Pt verbalized understanding of all pre-op instructions. 

## 2014-09-06 ENCOUNTER — Encounter (HOSPITAL_COMMUNITY): Payer: Self-pay | Admitting: Anesthesiology

## 2014-09-06 ENCOUNTER — Ambulatory Visit (HOSPITAL_COMMUNITY): Admission: RE | Admit: 2014-09-06 | Payer: Self-pay | Source: Ambulatory Visit | Admitting: Dentistry

## 2014-09-06 HISTORY — DX: Headache, unspecified: R51.9

## 2014-09-06 HISTORY — DX: Personal history of other medical treatment: Z92.89

## 2014-09-06 HISTORY — DX: Insomnia, unspecified: G47.00

## 2014-09-06 HISTORY — DX: Headache: R51

## 2014-09-06 HISTORY — DX: Cutaneous abscess, unspecified: L02.91

## 2014-09-06 HISTORY — DX: Anemia, unspecified: D64.9

## 2014-09-06 SURGERY — MULTIPLE EXTRACTION WITH ALVEOLOPLASTY
Anesthesia: General | Site: Mouth

## 2014-09-06 MED ORDER — LIDOCAINE HCL (CARDIAC) 20 MG/ML IV SOLN
INTRAVENOUS | Status: AC
  Filled 2014-09-06: qty 5

## 2014-09-06 MED ORDER — PROPOFOL 10 MG/ML IV BOLUS
INTRAVENOUS | Status: AC
  Filled 2014-09-06: qty 20

## 2014-09-06 MED ORDER — ROCURONIUM BROMIDE 50 MG/5ML IV SOLN
INTRAVENOUS | Status: AC
  Filled 2014-09-06: qty 1

## 2014-09-06 MED ORDER — NEOSTIGMINE METHYLSULFATE 10 MG/10ML IV SOLN
INTRAVENOUS | Status: AC
  Filled 2014-09-06: qty 1

## 2014-09-06 MED ORDER — ONDANSETRON HCL 4 MG/2ML IJ SOLN
INTRAMUSCULAR | Status: AC
  Filled 2014-09-06: qty 2

## 2014-09-06 MED ORDER — FENTANYL CITRATE (PF) 250 MCG/5ML IJ SOLN
INTRAMUSCULAR | Status: AC
Start: 2014-09-06 — End: 2014-09-06
  Filled 2014-09-06: qty 5

## 2014-09-06 MED ORDER — MIDAZOLAM HCL 2 MG/2ML IJ SOLN
INTRAMUSCULAR | Status: AC
  Filled 2014-09-06: qty 2

## 2014-09-06 MED ORDER — GLYCOPYRROLATE 0.2 MG/ML IJ SOLN
INTRAMUSCULAR | Status: AC
  Filled 2014-09-06: qty 4

## 2014-09-06 MED ORDER — DEXAMETHASONE SODIUM PHOSPHATE 10 MG/ML IJ SOLN
INTRAMUSCULAR | Status: AC
  Filled 2014-09-06: qty 1

## 2014-09-06 NOTE — Anesthesia Preprocedure Evaluation (Deleted)
Anesthesia Evaluation  Patient identified by MRN, date of birth, ID band Patient awake    Reviewed: Allergy & Precautions, NPO status , Patient's Chart, lab work & pertinent test results  Airway Mallampati: II  TM Distance: >3 FB Neck ROM: Full    Dental no notable dental hx. (+) Poor Dentition   Pulmonary neg pulmonary ROS, former smoker,  breath sounds clear to auscultation  Pulmonary exam normal       Cardiovascular hypertension, Pt. on medications Normal cardiovascular examRhythm:Regular Rate:Normal     Neuro/Psych  Headaches, negative psych ROS   GI/Hepatic negative GI ROS, Neg liver ROS,   Endo/Other  negative endocrine ROS  Renal/GU negative Renal ROS     Musculoskeletal negative musculoskeletal ROS (+)   Abdominal (+) + obese,   Peds  Hematology  (+) anemia ,   Anesthesia Other Findings   Reproductive/Obstetrics negative OB ROS                             Anesthesia Physical Anesthesia Plan  ASA: II  Anesthesia Plan: General   Post-op Pain Management:    Induction: Intravenous  Airway Management Planned: Oral ETT and Nasal ETT  Additional Equipment: None  Intra-op Plan:   Post-operative Plan: Extubation in OR  Informed Consent: I have reviewed the patients History and Physical, chart, labs and discussed the procedure including the risks, benefits and alternatives for the proposed anesthesia with the patient or authorized representative who has indicated his/her understanding and acceptance.   Dental advisory given  Plan Discussed with: CRNA  Anesthesia Plan Comments:         Anesthesia Quick Evaluation

## 2014-09-06 NOTE — Progress Notes (Signed)
Pt not here for surgery, attempted to call her with no answer, no voice mail set up, No way to leave a message. Dr Kristin Bruins called and informed.

## 2014-10-01 ENCOUNTER — Encounter (HOSPITAL_COMMUNITY): Payer: Self-pay

## 2014-10-01 ENCOUNTER — Inpatient Hospital Stay (HOSPITAL_COMMUNITY)
Admission: AD | Admit: 2014-10-01 | Discharge: 2014-10-01 | Disposition: A | Discharge status: Home / Self Care | Payer: Self-pay | Source: Ambulatory Visit | Attending: Obstetrics & Gynecology | Admitting: Obstetrics & Gynecology

## 2014-10-01 DIAGNOSIS — R1032 Left lower quadrant pain: Secondary | ICD-10-CM | POA: Insufficient documentation

## 2014-10-01 DIAGNOSIS — N939 Abnormal uterine and vaginal bleeding, unspecified: Secondary | ICD-10-CM | POA: Insufficient documentation

## 2014-10-01 LAB — CBC
HCT: 36 % (ref 36.0–46.0)
HEMOGLOBIN: 11.4 g/dL — AB (ref 12.0–15.0)
MCH: 24.1 pg — ABNORMAL LOW (ref 26.0–34.0)
MCHC: 31.7 g/dL (ref 30.0–36.0)
MCV: 76.1 fL — ABNORMAL LOW (ref 78.0–100.0)
Platelets: 182 10*3/uL (ref 150–400)
RBC: 4.73 MIL/uL (ref 3.87–5.11)
WBC: 11.9 10*3/uL — ABNORMAL HIGH (ref 4.0–10.5)

## 2014-10-01 LAB — URINALYSIS, ROUTINE W REFLEX MICROSCOPIC
Bilirubin Urine: NEGATIVE
Glucose, UA: NEGATIVE mg/dL
Hgb urine dipstick: NEGATIVE
KETONES UR: NEGATIVE mg/dL
Leukocytes, UA: NEGATIVE
NITRITE: NEGATIVE
PROTEIN: NEGATIVE mg/dL
Specific Gravity, Urine: 1.025 (ref 1.005–1.030)
UROBILINOGEN UA: 0.2 mg/dL (ref 0.0–1.0)
pH: 5 (ref 5.0–8.0)

## 2014-10-01 LAB — HCG, SERUM, QUALITATIVE: PREG SERUM: NEGATIVE

## 2014-10-01 LAB — TYPE AND SCREEN
ABO/RH(D): B POS
Antibody Screen: NEGATIVE

## 2014-10-01 LAB — WET PREP, GENITAL
Clue Cells Wet Prep HPF POC: NONE SEEN
Trich, Wet Prep: NONE SEEN
YEAST WET PREP: NONE SEEN

## 2014-10-01 MED ORDER — SODIUM CHLORIDE 0.9 % IV BOLUS (SEPSIS): 1000.0000 mL | Freq: Once | INTRAVENOUS | Status: DC

## 2014-10-01 MED ORDER — MEGESTROL ACETATE 40 MG PO TABS
80.0000 mg | ORAL_TABLET | Freq: Once | ORAL | Status: AC
Start: 2014-10-01 — End: 2014-10-01
  Administered 2014-10-01: 80 mg via ORAL
  Filled 2014-10-01: qty 2

## 2014-10-01 MED ORDER — MEGESTROL ACETATE 20 MG PO TABS: 80.0000 mg | ORAL_TABLET | Freq: Every day | ORAL | Status: DC

## 2014-10-01 MED ORDER — IBUPROFEN 600 MG PO TABS: 600.0000 mg | ORAL_TABLET | Freq: Four times a day (QID) | ORAL | Status: DC | PRN

## 2014-10-01 NOTE — Discharge Instructions (Signed)
Abnormal Uterine Bleeding Abnormal uterine bleeding can affect women at various stages in life, including teenagers, women in their reproductive years, pregnant women, and women who have reached menopause. Several kinds of uterine bleeding are considered abnormal, including:  Bleeding or spotting between periods.   Bleeding after sexual intercourse.   Bleeding that is heavier or more than normal.   Periods that last longer than usual.  Bleeding after menopause.  Many cases of abnormal uterine bleeding are minor and simple to treat, while others are more serious. Any type of abnormal bleeding should be evaluated by your health care provider. Treatment will depend on the cause of the bleeding. HOME CARE INSTRUCTIONS Monitor your condition for any changes. The following actions may help to alleviate any discomfort you are experiencing:  Avoid the use of tampons and douches as directed by your health care provider.  Change your pads frequently. You should get regular pelvic exams and Pap tests. Keep all follow-up appointments for diagnostic tests as directed by your health care provider.  SEEK MEDICAL CARE IF:   Your bleeding lasts more than 1 week.   You feel dizzy at times.  SEEK IMMEDIATE MEDICAL CARE IF:   You pass out.   You are changing pads every 15 to 30 minutes.   You have abdominal pain.  You have a fever.   You become sweaty or weak.   You are passing large blood clots from the vagina.   You start to feel nauseous and vomit. MAKE SURE YOU:   Understand these instructions.  Will watch your condition.  Will get help right away if you are not doing well or get worse. Document Released: 03/03/2005 Document Revised: 03/08/2013 Document Reviewed: 09/30/2012 ExitCare Patient Information 2015 [employer]. This information is not intended to replace advice given to you by your health care provider. Make sure you discuss any questions you have with your  health care provider.  

## 2014-10-01 NOTE — MAU Provider Note (Signed)
History     CSN: [id number]  Arrival date and time: 10/01/14 [id number]   First Provider Initiated Contact with Patient 10/01/14 0434     Patient's last menstrual period was 09/30/2014.  Chief Complaint  Patient presents with  . Vaginal Bleeding   Abdominal Pain This is a new problem. The current episode started today. The onset quality is gradual. The problem occurs constantly. The problem has been unchanged. The pain is located in the suprapubic region. The pain is at a severity of 5/10. The quality of the pain is cramping. The abdominal pain does not radiate. Pertinent negatives include no anorexia, constipation, diarrhea, dysuria, fever, frequency, hematuria, myalgias, nausea or vomiting. Nothing aggravates the pain. The pain is relieved by nothing. She has tried nothing for the symptoms. Her past medical history is significant for abdominal surgery. There is no history of colon cancer, Crohn's disease, gallstones, GERD, irritable bowel syndrome, pancreatitis, PUD or ulcerative colitis.   Hx of transfusion for heavy periods in the past. No suspicion of pregnancy.   OB History    Gravida Para Term Preterm AB TAB SAB Ectopic Multiple Living   7         6      Past Medical History  Diagnosis Date  . Abscess     facial  . Anemia   . History of blood transfusion   . Headache   . Insomnia   . Hypertension     PMH    Past Surgical History  Procedure Laterality Date  . Cesarean section    . Tonsillectomy    . Dilation and curettage of uterus      Family History  Problem Relation Age of Onset  . Hypertension Mother   . Anemia Sister   . Anemia Brother   . Anemia Other   . Hypertension Other   . Anemia Sister     History  Substance Use Topics  . Smoking status: Former Smoker    Types: Cigarettes  . Smokeless tobacco: Never Used     Comment: Quit smoking cigartees in 2009  . Alcohol Use: No    Allergies: No Known Allergies  Prescriptions prior to admission   Medication Sig Dispense Refill Last Dose  . amoxicillin-clavulanate (AUGMENTIN) 875-125 MG per tablet Take 1 tablet by mouth 2 (two) times daily. 20 tablet 0 Past Month at Unknown time  . ferrous sulfate 325 (65 FE) MG tablet Take 1 tablet (325 mg total) by mouth 3 (three) times daily with meals. 90 tablet 3 09/30/2014 at Unknown time  . HYDROcodone-acetaminophen (NORCO/VICODIN) 5-325 MG per tablet Take 1-2 tablets by mouth every 6 (six) hours as needed for severe pain. 30 tablet 0 Past Month at Unknown time    Review of Systems  Constitutional: Negative for fever.  Gastrointestinal: Positive for abdominal pain. Negative for nausea, vomiting, diarrhea, constipation and anorexia.  Genitourinary: Negative for dysuria, frequency and hematuria.       Pos for vaginal bleeding. Neg for vaginal discharge, passage of clots.   Musculoskeletal: Negative for myalgias and back pain.  Neurological: Negative for dizziness.  Endo/Heme/Allergies: Does not bruise/bleed easily.   Physical Exam   Blood pressure 157/97, pulse 76, temperature 98.6 F (37 C), temperature source Oral, resp. rate 16, height 5\' 5"  (1.651 m), weight 233 lb (105.688 kg), last menstrual period 09/30/2014, SpO2 98 %.  Physical Exam  Nursing note and vitals reviewed. Constitutional: She is oriented to person, place, and time. She appears well-developed and well-nourished.  No distress.  Cardiovascular: Normal rate and regular rhythm.   Respiratory: Effort normal. No respiratory distress.  Genitourinary: There is no rash or lesion on the right labia. There is no rash or lesion on the left labia. Uterus is not enlarged and not tender. Cervix exhibits no motion tenderness, no discharge and no friability. Right adnexum displays no mass, no tenderness and no fullness. Left adnexum displays no mass, no tenderness and no fullness. There is bleeding (small-moderate amount of bright red bleeding.) in the vagina. No erythema or tenderness in the  vagina. No signs of injury around the vagina. No vaginal discharge found.  Lymphadenopathy:       Right: No inguinal adenopathy present.       Left: No inguinal adenopathy present.  Neurological: She is alert and oriented to person, place, and time.  Skin: Skin is warm and dry.  Psychiatric: She has a normal mood and affect.   Results for orders placed or performed during the hospital encounter of 10/01/14 (from the past 336 hour(s))  GC/Chlamydia probe amp ()not at [facility]   Collection Time: 10/01/14 12:00 AM  Result Value Ref Range   Chlamydia Negative    Neisseria gonorrhea Negative   CBC   Collection Time: 10/01/14  4:26 AM  Result Value Ref Range   WBC 11.9 (H) 4.0 - 10.5 K/uL   RBC 4.73 3.87 - 5.11 MIL/uL   Hemoglobin 11.4 (L) 12.0 - 15.0 g/dL   HCT 16.1 09.6 - 04.5 %   MCV 76.1 (L) 78.0 - 100.0 fL   MCH 24.1 (L) 26.0 - 34.0 pg   MCHC 31.7 30.0 - 36.0 g/dL   RDW NOT CALCULATED 40.9 - 15.5 %   Platelets 182 150 - 400 K/uL  hCG, serum, qualitative   Collection Time: 10/01/14  4:26 AM  Result Value Ref Range   Preg, Serum NEGATIVE NEGATIVE  Type and screen   Collection Time: 10/01/14  4:29 AM  Result Value Ref Range   ABO/RH(D) B POS    Antibody Screen NEG    Sample Expiration 10/04/2014   Wet prep, genital   Collection Time: 10/01/14  5:00 AM  Result Value Ref Range   Yeast Wet Prep HPF POC NONE SEEN NONE SEEN   Trich, Wet Prep NONE SEEN NONE SEEN   Clue Cells Wet Prep HPF POC NONE SEEN NONE SEEN   WBC, Wet Prep HPF POC FEW (A) NONE SEEN  Urinalysis, Routine w reflex microscopic (not at [facility])   Collection Time: 10/01/14  5:00 AM  Result Value Ref Range   Color, Urine YELLOW YELLOW   APPearance CLEAR CLEAR   Specific Gravity, Urine 1.025 1.005 - 1.030   pH 5.0 5.0 - 8.0   Glucose, UA NEGATIVE NEGATIVE mg/dL   Hgb urine dipstick NEGATIVE NEGATIVE   Bilirubin Urine NEGATIVE NEGATIVE   Ketones, ur NEGATIVE NEGATIVE mg/dL   Protein, ur NEGATIVE NEGATIVE  mg/dL   Urobilinogen, UA 0.2 0.0 - 1.0 mg/dL   Nitrite NEGATIVE NEGATIVE   Leukocytes, UA NEGATIVE NEGATIVE    MAU Course  Procedures CBC, Wet prep, GC/Chlamydia, UA, Type and screen.  MDM Bleeding stable. Hgb stable. Not a new problem, but needs F/U w/ Gyn to eval for possible endometrial Bx and discuss long-term Tx since this is a recurrent problem. Discussed management options for menorrhagia including oral Provera, Depo Provera, Mirena IUD, endometrial ablation (Novasure/Hydrothermal Ablation/Thermachoice), myomectomy or hysterectomy as definitive surgical management. Discussed risks and benefits of each method.  Assessment and Plan   1. Abnormal uterine bleeding   2. Abdominal pain, left lower quadrant    D/C home in stable condition. Bleeding precautions. Follow-up Information    Follow up with Gynecologist of your choice.   Why:  Routine gynecology care and management of menstrual problems      Follow up with [facility] [place] [facility].   Why:  As needed in emergencies   Contact information:   [address] [id number] mc [place] [address] [phone]        Medication List    STOP taking these medications        amoxicillin-clavulanate 875-125 MG per tablet  Commonly known as:  AUGMENTIN      TAKE these medications        ferrous sulfate 325 (65 FE) MG tablet  Take 1 tablet (325 mg total) by mouth 3 (three) times daily with meals.     HYDROcodone-acetaminophen 5-325 MG per tablet  Commonly known as:  NORCO/VICODIN  Take 1-2 tablets by mouth every 6 (six) hours as needed for severe pain.     ibuprofen 600 MG tablet  Commonly known as:  ADVIL,MOTRIN  Take 1 tablet (600 mg total) by mouth every 6 (six) hours as needed for cramping.     megestrol 20 MG tablet  Commonly known as:  MEGACE  Take 4 tablets (80 mg total) by mouth daily.       Dorathy Kinsman 10/01/2014, 4:57 AM

## 2014-10-01 NOTE — MAU Note (Signed)
Left side lower belly pain started at 6:30 pm. Vaginal bleeding started at 3 pm.  Using pads and tampons and going through both.  Not pregnant that she knows of.  [facility].

## 2014-10-02 LAB — GC/CHLAMYDIA PROBE AMP (~~LOC~~) NOT AT ARMC
CHLAMYDIA, DNA PROBE: NEGATIVE
Neisseria Gonorrhea: NEGATIVE

## 2014-11-13 ENCOUNTER — Ambulatory Visit: Payer: Self-pay | Admitting: Obstetrics & Gynecology

## 2014-12-05 ENCOUNTER — Inpatient Hospital Stay (HOSPITAL_COMMUNITY): Payer: Self-pay

## 2014-12-05 ENCOUNTER — Inpatient Hospital Stay (HOSPITAL_COMMUNITY)
Admission: AD | Admit: 2014-12-05 | Discharge: 2014-12-05 | Disposition: A | Discharge status: Home / Self Care | Payer: Self-pay | Source: Ambulatory Visit | Attending: Obstetrics & Gynecology | Admitting: Obstetrics & Gynecology

## 2014-12-05 ENCOUNTER — Encounter (HOSPITAL_COMMUNITY): Payer: Self-pay

## 2014-12-05 DIAGNOSIS — N939 Abnormal uterine and vaginal bleeding, unspecified: Secondary | ICD-10-CM | POA: Insufficient documentation

## 2014-12-05 DIAGNOSIS — N760 Acute vaginitis: Secondary | ICD-10-CM | POA: Insufficient documentation

## 2014-12-05 DIAGNOSIS — B9689 Other specified bacterial agents as the cause of diseases classified elsewhere: Secondary | ICD-10-CM

## 2014-12-05 DIAGNOSIS — I10 Essential (primary) hypertension: Secondary | ICD-10-CM | POA: Insufficient documentation

## 2014-12-05 LAB — URINALYSIS, ROUTINE W REFLEX MICROSCOPIC
BILIRUBIN URINE: NEGATIVE
Glucose, UA: NEGATIVE mg/dL
Ketones, ur: NEGATIVE mg/dL
Leukocytes, UA: NEGATIVE
NITRITE: NEGATIVE
PH: 6 (ref 5.0–8.0)
Protein, ur: NEGATIVE mg/dL
Specific Gravity, Urine: >1.03 — ABNORMAL HIGH (ref 1.005–1.030)
Urobilinogen, UA: 0.2 mg/dL (ref 0.0–1.0)

## 2014-12-05 LAB — WET PREP, GENITAL
Trich, Wet Prep: NONE SEEN
YEAST WET PREP: NONE SEEN

## 2014-12-05 LAB — URINE MICROSCOPIC-ADD ON

## 2014-12-05 LAB — CBC
HEMATOCRIT: 32.4 % — AB (ref 36.0–46.0)
Hemoglobin: 10.2 g/dL — ABNORMAL LOW (ref 12.0–15.0)
MCH: 23.8 pg — AB (ref 26.0–34.0)
MCHC: 31.5 g/dL (ref 30.0–36.0)
MCV: 75.7 fL — AB (ref 78.0–100.0)
Platelets: 366 10*3/uL (ref 150–400)
RBC: 4.28 MIL/uL (ref 3.87–5.11)
RDW: 18.3 % — ABNORMAL HIGH (ref 11.5–15.5)
WBC: 8 10*3/uL (ref 4.0–10.5)

## 2014-12-05 LAB — POCT PREGNANCY, URINE: Preg Test, Ur: NEGATIVE

## 2014-12-05 LAB — HIV ANTIBODY (ROUTINE TESTING W REFLEX): HIV Screen 4th Generation wRfx: NONREACTIVE

## 2014-12-05 MED ORDER — HYDROCHLOROTHIAZIDE 25 MG PO TABS: 25.0000 mg | ORAL_TABLET | Freq: Every day | ORAL | Status: DC

## 2014-12-05 MED ORDER — METRONIDAZOLE 500 MG PO TABS: 500.0000 mg | ORAL_TABLET | Freq: Two times a day (BID) | ORAL | Status: DC

## 2014-12-05 MED ORDER — MEGESTROL ACETATE 20 MG PO TABS: 80.0000 mg | ORAL_TABLET | Freq: Every day | ORAL | Status: DC

## 2014-12-05 NOTE — MAU Provider Note (Signed)
History     CSN: [id number]  Arrival date and time: 12/05/14 4098   First Provider Initiated Contact with Patient 12/05/14 [phone]         Chief Complaint  Patient presents with  . Vaginal Discharge   HPI  Meagan Nelson is a 39 y.o. female who presents for prolonged vaginal bleeding and vaginal discharge.  Pt was seen in MAU in July for AUB. No show for gyn clinic appt in August.  Pt reports brown spotting since July. Brown discharge has foul odor. Denies abdominal pain or vaginal irritation. No intercourse since prior to July.  Denies chest pain, SOB, dizziness, headache.    Past Medical History  Diagnosis Date  . Abscess     facial  . Anemia   . History of blood transfusion   . Headache   . Insomnia   . Hypertension     PMH    Past Surgical History  Procedure Laterality Date  . Cesarean section    . Tonsillectomy    . Dilation and curettage of uterus      Family History  Problem Relation Age of Onset  . Hypertension Mother   . Anemia Sister   . Anemia Brother   . Anemia Other   . Hypertension Other   . Anemia Sister     Social History  Substance Use Topics  . Smoking status: Current Some Day Smoker -- 0.10 packs/day    Types: Cigarettes  . Smokeless tobacco: Never Used     Comment: Quit smoking cigartees in 2009  . Alcohol Use: No    Allergies: No Known Allergies  Prescriptions prior to admission  Medication Sig Dispense Refill Last Dose  . ferrous sulfate 325 (65 FE) MG tablet Take 1 tablet (325 mg total) by mouth 3 (three) times daily with meals. 90 tablet 3 09/30/2014 at Unknown time  . HYDROcodone-acetaminophen (NORCO/VICODIN) 5-325 MG per tablet Take 1-2 tablets by mouth every 6 (six) hours as needed for severe pain. 30 tablet 0 Past Month at Unknown time  . ibuprofen (ADVIL,MOTRIN) 600 MG tablet Take 1 tablet (600 mg total) by mouth every 6 (six) hours as needed for cramping. 30 tablet 1   . megestrol (MEGACE) 20 MG tablet Take 4 tablets (80 mg  total) by mouth daily. 60 tablet 1     Review of Systems  Constitutional: Negative.  Negative for fever.  Eyes: Negative for blurred vision.  Respiratory: Negative.  Negative for shortness of breath.   Cardiovascular: Negative.  Negative for chest pain and palpitations.  Gastrointestinal: Negative.  Negative for abdominal pain.  Genitourinary: Negative for dysuria.       + brown vaginal discharge  Neurological: Negative for dizziness and headaches.  Endo/Heme/Allergies: Does not bruise/bleed easily.   Physical Exam   Blood pressure 161/99, pulse 84, temperature 98.3 F (36.8 C), temperature source Oral, resp. rate 18, weight 243 lb (110.224 kg).  Physical Exam  Nursing note and vitals reviewed. Constitutional: She is oriented to person, place, and time. She appears well-developed and well-nourished. No distress.  HENT:  Head: Normocephalic and atraumatic.  Eyes: Conjunctivae are normal. Right eye exhibits no discharge. Left eye exhibits no discharge. No scleral icterus.  Neck: Normal range of motion.  Cardiovascular: Normal rate, regular rhythm and normal heart sounds.   No murmur heard. Respiratory: Effort normal and breath sounds normal. No respiratory distress. She has no wheezes.  GI: Soft. Bowel sounds are normal. She exhibits no distension. There is no  tenderness.  Genitourinary: Vagina normal. Uterus is enlarged. Uterus is not tender. Cervix exhibits no motion tenderness.  Tissue protruding from cervix approx 1-2cm. Bright red with lumpy surface.  Small amount of brown watery discharge  Neurological: She is alert and oriented to person, place, and time.  Skin: Skin is warm and dry. She is not diaphoretic.  Psychiatric: She has a normal mood and affect. Her behavior is normal. Judgment and thought content normal.    MAU Course  Procedures Results for orders placed or performed during the hospital encounter of 12/05/14 (from the past 24 hour(s))  Urinalysis, Routine w  reflex microscopic (not at [facility])     Status: Abnormal   Collection Time: 12/05/14  8:05 AM  Result Value Ref Range   Color, Urine YELLOW YELLOW   APPearance CLEAR CLEAR   Specific Gravity, Urine >1.030 (H) 1.005 - 1.030   pH 6.0 5.0 - 8.0   Glucose, UA NEGATIVE NEGATIVE mg/dL   Hgb urine dipstick MODERATE (A) NEGATIVE   Bilirubin Urine NEGATIVE NEGATIVE   Ketones, ur NEGATIVE NEGATIVE mg/dL   Protein, ur NEGATIVE NEGATIVE mg/dL   Urobilinogen, UA 0.2 0.0 - 1.0 mg/dL   Nitrite NEGATIVE NEGATIVE   Leukocytes, UA NEGATIVE NEGATIVE  Urine microscopic-add on     Status: None   Collection Time: 12/05/14  8:05 AM  Result Value Ref Range   Squamous Epithelial / LPF RARE RARE   WBC, UA 0-2 <3 WBC/hpf   RBC / HPF 3-6 <3 RBC/hpf   Bacteria, UA RARE RARE  Pregnancy, urine POC     Status: None   Collection Time: 12/05/14  8:15 AM  Result Value Ref Range   Preg Test, Ur NEGATIVE NEGATIVE  CBC     Status: Abnormal   Collection Time: 12/05/14  8:30 AM  Result Value Ref Range   WBC 8.0 4.0 - 10.5 K/uL   RBC 4.28 3.87 - 5.11 MIL/uL   Hemoglobin 10.2 (L) 12.0 - 15.0 g/dL   HCT 16.1 (L) 09.6 - 04.5 %   MCV 75.7 (L) 78.0 - 100.0 fL   MCH 23.8 (L) 26.0 - 34.0 pg   MCHC 31.5 30.0 - 36.0 g/dL   RDW 40.9 (H) 81.1 - 91.4 %   Platelets 366 150 - 400 K/uL  Wet prep, genital     Status: Abnormal   Collection Time: 12/05/14  8:55 AM  Result Value Ref Range   Yeast Wet Prep HPF POC NONE SEEN NONE SEEN   Trich, Wet Prep NONE SEEN NONE SEEN   Clue Cells Wet Prep HPF POC FEW (A) NONE SEEN   WBC, Wet Prep HPF POC FEW (A) NONE SEEN   US Transvaginal Non-ob  12/05/2014   CLINICAL DATA:  Abnormal uterine bleeding for 3 months.  EXAM: TRANSABDOMINAL AND TRANSVAGINAL ULTRASOUND OF PELVIS  TECHNIQUE: Both transabdominal and transvaginal ultrasound examinations of the pelvis were performed. Transabdominal technique was performed for global imaging of the pelvis including uterus, ovaries, adnexal regions, and  pelvic cul-de-sac. It was necessary to proceed with endovaginal exam following the transabdominal exam to visualize the endometrium.  COMPARISON:  None  FINDINGS: Uterus  Measurements: 11.0 x 5.6 x 6.4 cm. Focal posterior intramural fibroid measures up to 3.1 cm. Diffusely heterogeneous echotexture throughout the myometrium could reflect adenomyosis.  Endometrium  Thickness: 6 mm in thickness.  No focal abnormality visualized.  Right ovary  Measurements: 4.0 x 2.1 x 3.2 cm. Normal appearance/no adnexal mass.  Left ovary  Measurements: 2.4  x 1.3 x 1.5 cm. Normal appearance/no adnexal mass.  Other findings  No free fluid.  IMPRESSION: 3.1 cm posterior intramural fibroid. Diffuse heterogeneous echotexture throughout the myometrium could reflect adenomyosis.   Electronically Signed   By: Charlett Nose M.D.   On: 12/05/2014 10:37   US Pelvis Complete  12/05/2014   CLINICAL DATA:  Abnormal uterine bleeding for 3 months.  EXAM: TRANSABDOMINAL AND TRANSVAGINAL ULTRASOUND OF PELVIS  TECHNIQUE: Both transabdominal and transvaginal ultrasound examinations of the pelvis were performed. Transabdominal technique was performed for global imaging of the pelvis including uterus, ovaries, adnexal regions, and pelvic cul-de-sac. It was necessary to proceed with endovaginal exam following the transabdominal exam to visualize the endometrium.  COMPARISON:  None  FINDINGS: Uterus  Measurements: 11.0 x 5.6 x 6.4 cm. Focal posterior intramural fibroid measures up to 3.1 cm. Diffusely heterogeneous echotexture throughout the myometrium could reflect adenomyosis.  Endometrium  Thickness: 6 mm in thickness.  No focal abnormality visualized.  Right ovary  Measurements: 4.0 x 2.1 x 3.2 cm. Normal appearance/no adnexal mass.  Left ovary  Measurements: 2.4 x 1.3 x 1.5 cm. Normal appearance/no adnexal mass.  Other findings  No free fluid.  IMPRESSION: 3.1 cm posterior intramural fibroid. Diffuse heterogeneous echotexture throughout the  myometrium could reflect adenomyosis.   Electronically Signed   By: Charlett Nose M.D.   On: 12/05/2014 10:37    MDM 0900- C/w Dr. Erin Fulling regarding assessment. Ultrasound, megace, & f/u in clinic for endometrial biopsy.   Pt has hx of chronic HTN; hasn't seen PCP in over a year. Was on HCTZ. BP elevated here & last several visits. Patient is asymptomatic. Will give 1 month supply of HCTZ. Stressed importance of pt returning to PCP for management of HTN.   Assessment and Plan  A: 1. Abnormal uterine bleeding (AUB)   2. Chronic hypertension   3. BV (bacterial vaginosis)    P: Discharge home Rx flagyl, HCTZ, & megace Continue taking iron supplement Discussed reasons to go to local ED for HTN related issues  Make appt with PCP to manage HTN Message sent to [facility] for gyn appt for endometrial biopsy No alcohol while taking flagyl  Judeth Horn, NP  12/05/2014, 8:27 AM

## 2014-12-05 NOTE — Discharge Instructions (Signed)
Hypertension °Hypertension, commonly called high blood pressure, is when the force of blood pumping through your arteries is too strong. Your arteries are the blood vessels that carry blood from your heart throughout your body. A blood pressure reading consists of a higher number over a lower number, such as 110/72. The higher number (systolic) is the pressure inside your arteries when your heart pumps. The lower number (diastolic) is the pressure inside your arteries when your heart relaxes. Ideally you want your blood pressure below 120/80. °Hypertension forces your heart to work harder to pump blood. Your arteries may become narrow or stiff. Having hypertension puts you at risk for heart disease, stroke, and other problems.  °RISK FACTORS °Some risk factors for high blood pressure are controllable. Others are not.  °Risk factors you cannot control include:  °· Race. You may be at higher risk if you are African American. °· Age. Risk increases with age. °· Gender. Men are at higher risk than women before age 45 years. After age 65, women are at higher risk than men. °Risk factors you can control include: °· Not getting enough exercise or physical activity. °· Being overweight. °· Getting too much fat, sugar, calories, or salt in your diet. °· Drinking too much alcohol. °SIGNS AND SYMPTOMS °Hypertension does not usually cause signs or symptoms. Extremely high blood pressure (hypertensive crisis) may cause headache, anxiety, shortness of breath, and nosebleed. °DIAGNOSIS  °To check if you have hypertension, your health care provider will measure your blood pressure while you are seated, with your arm held at the level of your heart. It should be measured at least twice using the same arm. Certain conditions can cause a difference in blood pressure between your right and left arms. A blood pressure reading that is higher than normal on one occasion does not mean that you need treatment. If one blood pressure reading  is high, ask your health care provider about having it checked again. °TREATMENT  °Treating high blood pressure includes making lifestyle changes and possibly taking medicine. Living a healthy lifestyle can help lower high blood pressure. You may need to change some of your habits. °Lifestyle changes may include: °· Following the DASH diet. This diet is high in fruits, vegetables, and whole grains. It is low in salt, red meat, and added sugars. °· Getting at least 2½ hours of brisk physical activity every week. °· Losing weight if necessary. °· Not smoking. °· Limiting alcoholic beverages. °· Learning ways to reduce stress. ° If lifestyle changes are not enough to get your blood pressure under control, your health care provider may prescribe medicine. You may need to take more than one. Work closely with your health care provider to understand the risks and benefits. °HOME CARE INSTRUCTIONS °· Have your blood pressure rechecked as directed by your health care provider.   °· Take medicines only as directed by your health care provider. Follow the directions carefully. Blood pressure medicines must be taken as prescribed. The medicine does not work as well when you skip doses. Skipping doses also puts you at risk for problems.   °· Do not smoke.   °· Monitor your blood pressure at home as directed by your health care provider.  °SEEK MEDICAL CARE IF:  °· You think you are having a reaction to medicines taken. °· You have recurrent headaches or feel dizzy. °· You have swelling in your ankles. °· You have trouble with your vision. °SEEK IMMEDIATE MEDICAL CARE IF: °· You develop a severe headache or confusion. °·   You have unusual weakness, numbness, or feel faint.  You have severe chest or abdominal pain.  You vomit repeatedly.  You have trouble breathing. MAKE SURE YOU:   Understand these instructions.  Will watch your condition.  Will get help right away if you are not doing well or get worse. Document  Released: 03/03/2005 Document Revised: 07/18/2013 Document Reviewed: 12/24/2012 [facility] Patient Information 2015 [place], [place]. This information is not intended to replace advice given to you by your health care provider. Make sure you discuss any questions you have with your health care provider.   Dysfunctional Uterine Bleeding Normally, menstrual periods begin between ages 71 to 25 in young women. A normal menstrual cycle/period may begin every 23 days up to 35 days and lasts from 1 to 7 days. Around 12 to 14 days before your menstrual period starts, ovulation (ovary produces an egg) occurs. When counting the time between menstrual periods, count from the first day of bleeding of the previous period to the first day of bleeding of the next period. Dysfunctional (abnormal) uterine bleeding is bleeding that is different from a normal menstrual period. Your periods may come earlier or later than usual. They may be lighter, have blood clots or be heavier. You may have bleeding between periods, or you may skip one period or more. You may have bleeding after sexual intercourse, bleeding after menopause, or no menstrual period. CAUSES   Pregnancy (normal, miscarriage, tubal).  IUDs (intrauterine device, birth control).  Birth control pills.  Hormone treatment.  Menopause.  Infection of the cervix.  Blood clotting problems.  Infection of the inside lining of the uterus.  Endometriosis, inside lining of the uterus growing in the pelvis and other female organs.  Adhesions (scar tissue) inside the uterus.  Obesity or severe weight loss.  Uterine polyps inside the uterus.  Cancer of the vagina, cervix, or uterus.  Ovarian cysts or polycystic ovary syndrome.  Medical problems (diabetes, thyroid disease).  Uterine fibroids (noncancerous tumor).  Problems with your female hormones.  Endometrial hyperplasia, very thick lining and enlarged cells inside of the uterus.  Medicines that  interfere with ovulation.  Radiation to the pelvis or abdomen.  Chemotherapy. DIAGNOSIS   Your doctor will discuss the history of your menstrual periods, medicines you are taking, changes in your weight, stress in your life, and any medical problems you may have.  Your doctor will do a physical and pelvic examination.  Your doctor may want to perform certain tests to make a diagnosis, such as:  Pap test.  Blood tests.  Cultures for infection.  CT scan.  Ultrasound.  Hysteroscopy.  Laparoscopy.  MRI.  Hysterosalpingography.  D and C.  Endometrial biopsy. TREATMENT  Treatment will depend on the cause of the dysfunctional uterine bleeding (DUB). Treatment may include:  Observing your menstrual periods for a couple of months.  Prescribing medicines for medical problems, including:  Antibiotics.  Hormones.  Birth control pills.  Removing an IUD (intrauterine device, birth control).  Surgery:  D and C (scrape and remove tissue from inside the uterus).  Laparoscopy (examine inside the abdomen with a lighted tube).  Uterine ablation (destroy lining of the uterus with electrical current, laser, heat, or freezing).  Hysteroscopy (examine cervix and uterus with a lighted tube).  Hysterectomy (remove the uterus). HOME CARE INSTRUCTIONS   If medicines were prescribed, take exactly as directed. Do not change or switch medicines without consulting your caregiver.  Long term heavy bleeding may result in iron deficiency. Your  caregiver may have prescribed iron pills. They help replace the iron that your body lost from heavy bleeding. Take exactly as directed.  Do not take aspirin or medicines that contain aspirin one week before or during your menstrual period. Aspirin may make the bleeding worse.  If you need to change your sanitary pad or tampon more than once every 2 hours, stay in bed with your feet elevated and a cold pack on your lower abdomen. Rest as much as  possible, until the bleeding stops or slows down.  Eat well-balanced meals. Eat foods high in iron. Examples are:  Leafy green vegetables.  Whole-grain breads and cereals.  Eggs.  Meat.  Liver.  Do not try to lose weight until the abnormal bleeding has stopped and your blood iron level is back to normal. Do not lift more than ten pounds or do strenuous activities when you are bleeding.  For a couple of months, make note on your calendar, marking the start and ending of your period, and the type of bleeding (light, medium, heavy, spotting, clots or missed periods). This is for your caregiver to better evaluate your problem. SEEK MEDICAL CARE IF:   You develop nausea (feeling sick to your stomach) and vomiting, dizziness, or diarrhea while you are taking your medicine.  You are getting lightheaded or weak.  You have any problems that may be related to the medicine you are taking.  You develop pain with your DUB.  You want to remove your IUD.  You want to stop or change your birth control pills or hormones.  You have any type of abnormal bleeding mentioned above.  You are over 60 years old and have not had a menstrual period yet.  You are 39 years old and you are still having menstrual periods.  You have any of the symptoms mentioned above.  You develop a rash. SEEK IMMEDIATE MEDICAL CARE IF:   An oral temperature above 102 F (38.9 C) develops.  You develop chills.  You are changing your sanitary pad or tampon more than once an hour.  You develop abdominal pain.  You pass out or faint. Document Released: 02/29/2000 Document Revised: 05/26/2011 Document Reviewed: 01/30/2009 [facility] Patient Information 2015 [place], [place]. This information is not intended to replace advice given to you by your health care provider. Make sure you discuss any questions you have with your health care provider.   Bacterial Vaginosis Bacterial vaginosis is an infection of the vagina.  It happens when too many of certain germs (bacteria) grow in the vagina. HOME CARE  Take your medicine as told by your doctor.  Finish your medicine even if you start to feel better.  Do not have sex until you finish your medicine and are better.  Tell your sex partner that you have an infection. They should see their doctor for treatment.  Practice safe sex. Use condoms. Have only one sex partner. GET HELP IF:  You are not getting better after 3 days of treatment.  You have more grey fluid (discharge) coming from your vagina than before.  You have more pain than before.  You have a fever. MAKE SURE YOU:   Understand these instructions.  Will watch your condition.  Will get help right away if you are not doing well or get worse. Document Released: 12/11/2007 Document Revised: 12/22/2012 Document Reviewed: 10/13/2012 [facility] Patient Information 2015 [place], [place]. This information is not intended to replace advice given to you by your health care provider. Make sure  you discuss any questions you have with your health care provider.

## 2014-12-05 NOTE — MAU Note (Signed)
Having all kinds of problems.  Started with the bleeding, was given pills for it. Now has a brownish d/c that has a really bad odor.

## 2014-12-06 LAB — GC/CHLAMYDIA PROBE AMP (~~LOC~~) NOT AT ARMC
Chlamydia: NEGATIVE
NEISSERIA GONORRHEA: NEGATIVE

## 2014-12-21 ENCOUNTER — Telehealth: Payer: Self-pay | Admitting: *Deleted

## 2014-12-21 ENCOUNTER — Encounter: Payer: Self-pay | Admitting: *Deleted

## 2014-12-21 ENCOUNTER — Other Ambulatory Visit: Payer: Self-pay | Admitting: Obstetrics & Gynecology

## 2014-12-21 NOTE — Telephone Encounter (Signed)
Meagan Nelson missed a scheduled appointment for abnormal bleeding for endometrial biospsy. Called her and unable to leave message as voicemail not set up. Will send letter.

## 2014-12-26 ENCOUNTER — Telehealth: Payer: Self-pay | Admitting: *Deleted

## 2014-12-26 NOTE — Telephone Encounter (Signed)
Returned patient's phone call, discussed findings of small fibroid on ultrasound. Patient concerned about what can be done to help with the bleeding in light of the ultrasound findings. Advised patient to discuss her concerns with her provider whe she comes to her follow up visit on 10/26. Patient is scheduled to see Dr Erin Fulling and ultrasound results will be discussed more in depth at that time. Patient voiced understanding.

## 2014-12-26 NOTE — Telephone Encounter (Signed)
Patient phoned wanting ultrasound results.

## 2015-01-10 ENCOUNTER — Other Ambulatory Visit: Payer: Self-pay | Admitting: Obstetrics & Gynecology

## 2015-02-16 ENCOUNTER — Encounter: Payer: Self-pay | Admitting: Family Medicine

## 2015-02-16 ENCOUNTER — Other Ambulatory Visit (HOSPITAL_COMMUNITY)
Admission: RE | Admit: 2015-02-16 | Discharge: 2015-02-16 | Disposition: A | Discharge status: Home / Self Care | Payer: Self-pay | Source: Ambulatory Visit | Attending: Family Medicine | Admitting: Family Medicine

## 2015-02-16 ENCOUNTER — Ambulatory Visit (INDEPENDENT_AMBULATORY_CARE_PROVIDER_SITE_OTHER): Payer: Self-pay | Admitting: Family Medicine

## 2015-02-16 VITALS — BP 157/102 | HR 89 | Temp 97.8°F | Wt 247.8 lb

## 2015-02-16 DIAGNOSIS — N939 Abnormal uterine and vaginal bleeding, unspecified: Secondary | ICD-10-CM

## 2015-02-16 DIAGNOSIS — N841 Polyp of cervix uteri: Secondary | ICD-10-CM | POA: Insufficient documentation

## 2015-02-16 MED ORDER — MEGESTROL ACETATE 40 MG PO TABS: 80.0000 mg | ORAL_TABLET | Freq: Two times a day (BID) | ORAL | Status: DC

## 2015-02-16 NOTE — Patient Instructions (Signed)

## 2015-02-16 NOTE — Progress Notes (Signed)
Subjective:    Patient ID: Meagan RaiderKeshia Caracci, female    DOB: 04/19/1975, 39 y.o.   MRN: [id number]  Vaginal Bleeding The patient's primary symptoms include vaginal bleeding. This is a new problem. Episode onset: 4 months ago. The problem occurs constantly. Progression since onset: improved with taking megace in september. The patient is experiencing no pain. She is not pregnant. Pertinent negatives include no abdominal pain, chills, constipation, diarrhea, fever or hematuria. The vaginal bleeding is spotting (since starting megace.  Before this was heavy bleeding, heavier than menses). She has not been passing clots. She has not been passing tissue. Nothing aggravates the symptoms. She is not sexually active. No, her partner does not have an STD. She uses abstinence for contraception.   Review of Systems  Constitutional: Negative for fever and chills.  Gastrointestinal: Negative for abdominal pain, diarrhea and constipation.  Genitourinary: Positive for vaginal bleeding. Negative for hematuria.       Objective:   Physical Exam  Constitutional: She appears well-developed and well-nourished.  Abdominal: Soft. Bowel sounds are normal. She exhibits no mass. There is no tenderness. There is no rebound and no guarding.  Genitourinary: There is no rash, tenderness, lesion or injury on the right labia. There is no rash, tenderness, lesion or injury on the left labia. There is bleeding (light staining) in the vagina. No erythema or tenderness in the vagina. No foreign body around the vagina. No signs of injury around the vagina. No vaginal discharge found.    Skin: Skin is warm and dry.  Psychiatric: She has a normal mood and affect. Her behavior is normal. Judgment and thought content normal.      Assessment & Plan:  1. Abnormal uterine bleeding (AUB) Endometrial biopsy done.  Increase megace to 80mg  BID x 1 week.  If pathology negative, will attempt to stop to see if bleeding is due to fibroid vs  polyp.  If bleeding returns, will have patient return for IUD placement. - Surgical pathology  2. Cervical polyp Removed as below. - Surgical pathology  CERVICAL POLYPECTOMY NOTE Risks of the biopsy including pain, bleeding, infection, inadequate specimen, and need for additional procedures were discussed. The patient stated understanding and agreed to undergo procedure today. Consent was signed,  time out performed.  The patient's cervix was prepped with Betadine.  Ring forceps were used to grasp the polypoid lesion and the polypoid lesions was removed by twisting it off its base.  Small bleeding was noted and hemostasis was achieved using silver nitrate sticks.  The patient tolerated the procedure well. Post-procedure instructions were given to the patient. The patient is to call with heavy bleeding, fever greater than 100.4, foul smelling vaginal discharge or other concerns.   ENDOMETRIAL BIOPSY     The indications for endometrial biopsy were reviewed.   Risks of the biopsy including cramping, bleeding, infection, uterine perforation, inadequate specimen and need for additional procedures  were discussed. The patient states she understands and agrees to undergo procedure today. Consent was signed. Time out was performed. A sterile speculum was placed in the patient's vagina and the cervix was prepped with Betadine. A single-toothed tenaculum was placed on the anterior lip of the cervix to stabilize it. The 3 mm pipelle was introduced into the endometrial cavity without difficulty to a depth of 8cm, and a moderate amount of tissue was obtained and sent to pathology. The instruments were removed from the patient's vagina. Minimal bleeding from the cervix was noted. The patient tolerated the procedure  well. Routine post-procedure instructions were given to the patient. The patient will follow up to review the results and for further management.

## 2015-02-28 ENCOUNTER — Telehealth: Payer: Self-pay | Admitting: General Practice

## 2015-02-28 NOTE — Telephone Encounter (Signed)
Per Dr Adrian BlackwaterStinson, patient's endo biopsy and cervical polyp were normal. She should attempt to go off her megace. She will have a withdrawal bleed. If it lasts for more than two weeks, she should call and notify us. Patient needs follow up appt in our office for beginning of January. Called patient at home number, no answer- left message to call us back at the clinics for results. Called patient on mobile number, no answer- unable to leave message

## 2015-03-01 NOTE — Telephone Encounter (Signed)
Called patient and informed her of results & recommendations. Patient verbalized understanding to all & will await phone call from front office to schedule appt. Patient had no questions

## 2015-03-22 ENCOUNTER — Ambulatory Visit: Payer: Self-pay | Admitting: Family Medicine

## 2015-04-11 ENCOUNTER — Encounter (HOSPITAL_COMMUNITY): Payer: Self-pay | Admitting: Emergency Medicine

## 2015-04-11 ENCOUNTER — Emergency Department (HOSPITAL_COMMUNITY)
Admission: EM | Admit: 2015-04-11 | Discharge: 2015-04-12 | Disposition: A | Discharge status: Home / Self Care | Payer: No Typology Code available for payment source | Attending: Emergency Medicine | Admitting: Emergency Medicine

## 2015-04-11 DIAGNOSIS — D649 Anemia, unspecified: Secondary | ICD-10-CM | POA: Insufficient documentation

## 2015-04-11 DIAGNOSIS — Y998 Other external cause status: Secondary | ICD-10-CM | POA: Diagnosis not present

## 2015-04-11 DIAGNOSIS — Y92410 Unspecified street and highway as the place of occurrence of the external cause: Secondary | ICD-10-CM | POA: Diagnosis not present

## 2015-04-11 DIAGNOSIS — M62830 Muscle spasm of back: Secondary | ICD-10-CM

## 2015-04-11 DIAGNOSIS — Y9389 Activity, other specified: Secondary | ICD-10-CM | POA: Insufficient documentation

## 2015-04-11 DIAGNOSIS — I10 Essential (primary) hypertension: Secondary | ICD-10-CM | POA: Insufficient documentation

## 2015-04-11 DIAGNOSIS — Z872 Personal history of diseases of the skin and subcutaneous tissue: Secondary | ICD-10-CM | POA: Diagnosis not present

## 2015-04-11 DIAGNOSIS — G8929 Other chronic pain: Secondary | ICD-10-CM | POA: Diagnosis not present

## 2015-04-11 DIAGNOSIS — S29002A Unspecified injury of muscle and tendon of back wall of thorax, initial encounter: Secondary | ICD-10-CM | POA: Insufficient documentation

## 2015-04-11 DIAGNOSIS — S199XXA Unspecified injury of neck, initial encounter: Secondary | ICD-10-CM | POA: Diagnosis not present

## 2015-04-11 DIAGNOSIS — F17210 Nicotine dependence, cigarettes, uncomplicated: Secondary | ICD-10-CM | POA: Diagnosis not present

## 2015-04-11 DIAGNOSIS — R110 Nausea: Secondary | ICD-10-CM | POA: Insufficient documentation

## 2015-04-11 DIAGNOSIS — Z79899 Other long term (current) drug therapy: Secondary | ICD-10-CM | POA: Diagnosis not present

## 2015-04-11 MED ORDER — OXYCODONE-ACETAMINOPHEN 5-325 MG PO TABS: 1.0000 | ORAL_TABLET | Freq: Four times a day (QID) | ORAL | Status: DC | PRN

## 2015-04-11 MED ORDER — ONDANSETRON 4 MG PO TBDP
4.0000 mg | ORAL_TABLET | Freq: Once | ORAL | Status: AC
  Administered 2015-04-12: 4 mg via ORAL
  Filled 2015-04-11: qty 1

## 2015-04-11 MED ORDER — CYCLOBENZAPRINE HCL 10 MG PO TABS: 10.0000 mg | ORAL_TABLET | Freq: Two times a day (BID) | ORAL | Status: DC | PRN

## 2015-04-11 MED ORDER — OXYCODONE-ACETAMINOPHEN 5-325 MG PO TABS
2.0000 | ORAL_TABLET | Freq: Once | ORAL | Status: AC
  Administered 2015-04-12: 2 via ORAL
  Filled 2015-04-11: qty 2

## 2015-04-11 NOTE — Discharge Instructions (Signed)
Heat Therapy  Heat therapy can help ease sore, stiff, injured, and tight muscles and joints. Heat relaxes your muscles, which may help ease your pain.   RISKS AND COMPLICATIONS  If you have any of the following conditions, do not use heat therapy unless your health care provider has approved:   Poor circulation.   Healing wounds or scarred skin in the area being treated.   Diabetes, heart disease, or high blood pressure.   Not being able to feel (numbness) the area being treated.   Unusual swelling of the area being treated.   Active infections.   Blood clots.   Cancer.   Inability to communicate pain. This may include young children and people who have problems with their brain function (dementia).   Pregnancy.  Heat therapy should only be used on old, pre-existing, or long-lasting (chronic) injuries. Do not use heat therapy on new injuries unless directed by your health care provider.  HOW TO USE HEAT THERAPY  There are several different kinds of heat therapy, including:   Moist heat pack.   Warm water bath.   Hot water bottle.   Electric heating pad.   Heated gel pack.   Heated wrap.   Electric heating pad.  Use the heat therapy method suggested by your health care provider. Follow your health care provider's instructions on when and how to use heat therapy.  GENERAL HEAT THERAPY RECOMMENDATIONS   Do not sleep while using heat therapy. Only use heat therapy while you are awake.   Your skin may turn pink while using heat therapy. Do not use heat therapy if your skin turns red.   Do not use heat therapy if you have new pain.   High heat or long exposure to heat can cause burns. Be careful when using heat therapy to avoid burning your skin.   Do not use heat therapy on areas of your skin that are already irritated, such as with a rash or sunburn.  SEEK MEDICAL CARE IF:   You have blisters, redness, swelling, or numbness.   You have new pain.   Your pain is worse.  MAKE SURE  YOU:   Understand these instructions.   Will watch your condition.   Will get help right away if you are not doing well or get worse.     This information is not intended to replace advice given to you by your health care provider. Make sure you discuss any questions you have with your health care provider.     Document Released: 05/26/2011 Document Revised: 03/24/2014 Document Reviewed: 04/26/2013  Elsevier Interactive Patient Education 2016 [employer]
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
## 2015-05-16 ENCOUNTER — Encounter: Payer: Self-pay | Admitting: *Deleted

## 2015-06-11 ENCOUNTER — Emergency Department (HOSPITAL_COMMUNITY)
Admission: EM | Admit: 2015-06-11 | Discharge: 2015-06-11 | Disposition: A | Discharge status: Home / Self Care | Payer: No Typology Code available for payment source | Attending: Emergency Medicine | Admitting: Emergency Medicine

## 2015-06-11 ENCOUNTER — Encounter (HOSPITAL_COMMUNITY): Payer: Self-pay | Admitting: *Deleted

## 2015-06-11 DIAGNOSIS — K029 Dental caries, unspecified: Secondary | ICD-10-CM | POA: Insufficient documentation

## 2015-06-11 DIAGNOSIS — Z8669 Personal history of other diseases of the nervous system and sense organs: Secondary | ICD-10-CM | POA: Insufficient documentation

## 2015-06-11 DIAGNOSIS — D649 Anemia, unspecified: Secondary | ICD-10-CM | POA: Insufficient documentation

## 2015-06-11 DIAGNOSIS — H9209 Otalgia, unspecified ear: Secondary | ICD-10-CM | POA: Insufficient documentation

## 2015-06-11 DIAGNOSIS — K047 Periapical abscess without sinus: Secondary | ICD-10-CM

## 2015-06-11 DIAGNOSIS — Z79899 Other long term (current) drug therapy: Secondary | ICD-10-CM | POA: Insufficient documentation

## 2015-06-11 DIAGNOSIS — F17210 Nicotine dependence, cigarettes, uncomplicated: Secondary | ICD-10-CM | POA: Insufficient documentation

## 2015-06-11 DIAGNOSIS — I10 Essential (primary) hypertension: Secondary | ICD-10-CM | POA: Insufficient documentation

## 2015-06-11 MED ORDER — PENICILLIN V POTASSIUM 500 MG PO TABS: 500.0000 mg | ORAL_TABLET | Freq: Four times a day (QID) | ORAL | Status: DC

## 2015-06-11 MED ORDER — BUPIVACAINE-EPINEPHRINE (PF) 0.5% -1:200000 IJ SOLN
1.8000 mL | Freq: Once | INTRAMUSCULAR | Status: AC
  Administered 2015-06-11: 9 mg

## 2015-06-11 MED ORDER — NAPROXEN 500 MG PO TABS: 500.0000 mg | ORAL_TABLET | Freq: Two times a day (BID) | ORAL | Status: DC

## 2015-06-11 NOTE — Discharge Instructions (Signed)

## 2015-06-11 NOTE — ED Provider Notes (Signed)
CSN: [id number]     Arrival date & time 06/11/15  [id number] History  By signing my name below, I, Meagan Nelson, attest that this documentation has been prepared under the direction and in the presence of Everlene FarrierWilliam Stasha Naraine, PA-C. Electronically Signed: Tanda RockersMargaux Nelson, ED Scribe. 06/11/2015. 9:07 AM.   Chief Complaint  Patient presents with  . Dental Pain   The history is provided by the patient. No language interpreter was used.     HPI Comments: Meagan Nelson is a 40 y.o. female with PMHx HTN who presents to the Emergency Department complaining of gradual onset, constant, 10/10, left lower dental pain that began last night. Pt believes she has a dental abscess. She also complains of left ear pain and left sided facial swelling. Pt does have a dentist but is waiting for her tax return to come in to be able to afford the visit. She has not taken anything for her pain today. She denies drainage, neck pain, neck stiffness, or any other associated symptoms. Pt is able to tolerate her own secretions.    Past Medical History  Diagnosis Date  . Abscess     facial  . Anemia   . History of blood transfusion   . Headache   . Insomnia   . Hypertension     PMH   Past Surgical History  Procedure Laterality Date  . Cesarean section    . Tonsillectomy    . Dilation and curettage of uterus     Family History  Problem Relation Age of Onset  . Hypertension Mother   . Anemia Sister   . Anemia Brother   . Anemia Other   . Hypertension Other   . Anemia Sister    Social History  Substance Use Topics  . Smoking status: Current Some Day Smoker -- 0.10 packs/day    Types: Cigarettes  . Smokeless tobacco: Never Used     Comment: Quit smoking cigartees in 2009  . Alcohol Use: No   OB History    Gravida Para Term Preterm AB TAB SAB Ectopic Multiple Living   7 0   1  1   5      Review of Systems  Constitutional: Negative for fever.  HENT: Positive for dental problem, ear pain and facial swelling.  Negative for drooling, ear discharge, sore throat, trouble swallowing and voice change.   Musculoskeletal: Negative for neck pain and neck stiffness.  Skin: Negative for rash.   Allergies  Review of patient's allergies indicates no known allergies.  Home Medications   Prior to Admission medications   Medication Sig Start Date End Date Taking? Authorizing Provider  cyclobenzaprine (FLEXERIL) 10 MG tablet Take 1 tablet (10 mg total) by mouth 2 (two) times daily as needed for muscle spasms. 04/11/15   Arthor CaptainAbigail Harris, PA-C  ferrous sulfate 325 (65 FE) MG tablet Take 1 tablet (325 mg total) by mouth 3 (three) times daily with meals. 09/03/14   Vassie Lollarlos Madera, MD  hydrochlorothiazide (HYDRODIURIL) 25 MG tablet Take 1 tablet (25 mg total) by mouth daily. Patient not taking: Reported on 02/16/2015 12/05/14   Judeth HornErin Lawrence, NP  megestrol (MEGACE) 40 MG tablet Take 2 tablets (80 mg total) by mouth 2 (two) times daily. 02/16/15   Levie HeritageJacob J Stinson, DO  metroNIDAZOLE (FLAGYL) 500 MG tablet Take 1 tablet (500 mg total) by mouth 2 (two) times daily. Patient not taking: Reported on 02/16/2015 12/05/14   Judeth HornErin Lawrence, NP  naproxen (NAPROSYN) 500 MG tablet Take 1 tablet (  500 mg total) by mouth 2 (two) times daily with a meal. 06/11/15   Everlene Farrier, PA-C  oxyCODONE-acetaminophen (ROXICET) 5-325 MG tablet Take 1-2 tablets by mouth every 6 (six) hours as needed for severe pain. 04/11/15   Arthor Captain, PA-C  penicillin v potassium (VEETID) 500 MG tablet Take 1 tablet (500 mg total) by mouth 4 (four) times daily. 06/11/15   Everlene Farrier, PA-C   BP 143/97 mmHg  Pulse 92  Temp(Src) 98.6 F (37 C) (Oral)  Resp 20  Ht  (1.651 m)  Wt 104.327 kg  BMI 38.27 kg/m2  SpO2 100%  LMP 05/14/2015   Physical Exam  Constitutional: She appears well-developed and well-nourished. No distress.  Non-toxic appearing.   HENT:  Head: Normocephalic and atraumatic.  Right Ear: Tympanic membrane and external ear normal.   Left Ear: Tympanic membrane and external ear normal.  Mouth/Throat: Oropharynx is clear and moist. No oropharyngeal exudate.  Generally poor dentition. Multiple dental caries. Mild edema to the left side of lower jaw. Right side of lower jaw has some induration with fluctuance.   No discharge from the mouth. Uvula is midline without edema. Soft palate rises symmetrically. No tonsillar hypertrophy or exudates. Tongue protrusion is normal.  Initially there is some trismus which resolved with viscous lidocaine.  Bilateral tympanic membranes are pearly-gray without erythema or loss of landmarks.   Eyes: Conjunctivae are normal. Pupils are equal, round, and reactive to light. Right eye exhibits no discharge. Left eye exhibits no discharge.  Neck: Normal range of motion. Neck supple. No JVD present. No tracheal deviation present.  Good and normal range of motion of her neck.  Cardiovascular: Normal rate and intact distal pulses.   Pulmonary/Chest: Effort normal. No respiratory distress.  Lymphadenopathy:    She has no cervical adenopathy.  Neurological: Coordination normal.  Skin: No rash noted. She is not diaphoretic.  Psychiatric: She has a normal mood and affect. Her behavior is normal.  Nursing note and vitals reviewed.   ED Course  .Marland KitchenIncision and Drainage Date/Time: 06/11/2015 9:33 AM Performed by: Everlene Farrier Authorized by: Everlene Farrier Consent: Verbal consent obtained. Risks and benefits: risks, benefits and alternatives were discussed Consent given by: patient Patient understanding: patient states understanding of the procedure being performed Patient consent: the patient's understanding of the procedure matches consent given Procedure consent: procedure consent matches procedure scheduled Relevant documents: relevant documents present and verified Test results: test results available and properly labeled Site marked: the operative site was marked Required items: required  blood products, implants, devices, and special equipment available Patient identity confirmed: verbally with patient Time out: Immediately prior to procedure a "time out" was called to verify the correct patient, procedure, equipment, support staff and site/side marked as required. Type: abscess Body area: mouth Location details: alveolar process Anesthesia: local infiltration Local anesthetic: bupivacaine 0.5% with epinephrine Anesthetic total: 0.5 ml Patient sedated: no Scalpel size: 11 Incision type: single straight Drainage: purulent Drainage amount: moderate Wound treatment: wound left open Packing material: none Patient tolerance: Patient tolerated the procedure well with no immediate complications   (including critical care time)  DIAGNOSTIC STUDIES: Oxygen Saturation is 100% on RA, normal by my interpretation.    COORDINATION OF CARE: 9:06 AM-Discussed treatment plan with pt at bedside and pt agreed to plan.    MDM   Meds given in ED:  Medications - No data to display  New Prescriptions   NAPROXEN (NAPROSYN) 500 MG TABLET    Take 1 tablet (500  mg total) by mouth 2 (two) times daily with a meal.   PENICILLIN V POTASSIUM (VEETID) 500 MG TABLET    Take 1 tablet (500 mg total) by mouth 4 (four) times daily.    Final diagnoses:  Dental abscess   This  is a 40 y.o. female with PMHx HTN who presents to the Emergency Department complaining of gradual onset, constant, 10/10, left lower dental pain that began last night. Pt believes she has a dental abscess. She also complains of left ear pain and left sided facial swelling. On exam the patient is afebrile and nontoxic appearing. She has some mild edema noted to the left lower jaw. There is some induration and fluctuance in her left lower inner mouth. No discharge. Initially there is some slight trismus. This resolved with viscous lidocaine topically. I anesthetized the area with Marcaine and performed an incision and drainage.  I obtained a moderate amount of purulent drainage. Patient however procedure well. We'll start the patient on penicillin and naproxen and have her follow-up with her dental provider. Patient does have a dental provider that she sees. I advised the patient to follow-up with their primary care provider this week. I advised the patient to return to the emergency department with new or worsening symptoms or new concerns. The patient verbalized understanding and agreement with plan.    I personally performed the services described in this documentation, which was scribed in my presence. The recorded information has been reviewed and is accurate.          Everlene Farrier, PA-C 06/11/15 1610  Arby Barrette, MD 06/13/15 1030

## 2015-06-11 NOTE — ED Notes (Signed)
PT reports dental pain to the LT.

## 2015-06-11 NOTE — ED Notes (Signed)
Declined W/C at D/C and was escorted to lobby by RN. 

## 2016-11-05 ENCOUNTER — Emergency Department (HOSPITAL_COMMUNITY): Payer: Self-pay

## 2016-11-05 ENCOUNTER — Emergency Department (HOSPITAL_COMMUNITY)
Admission: EM | Admit: 2016-11-05 | Discharge: 2016-11-05 | Disposition: A | Discharge status: Home / Self Care | Payer: Self-pay | Attending: Emergency Medicine | Admitting: Emergency Medicine

## 2016-11-05 ENCOUNTER — Encounter (HOSPITAL_COMMUNITY): Payer: Self-pay | Admitting: Emergency Medicine

## 2016-11-05 DIAGNOSIS — Z79899 Other long term (current) drug therapy: Secondary | ICD-10-CM | POA: Insufficient documentation

## 2016-11-05 DIAGNOSIS — M25562 Pain in left knee: Secondary | ICD-10-CM | POA: Insufficient documentation

## 2016-11-05 DIAGNOSIS — I10 Essential (primary) hypertension: Secondary | ICD-10-CM | POA: Insufficient documentation

## 2016-11-05 DIAGNOSIS — F17210 Nicotine dependence, cigarettes, uncomplicated: Secondary | ICD-10-CM | POA: Insufficient documentation

## 2016-11-05 MED ORDER — ACETAMINOPHEN 325 MG PO TABS
650.0000 mg | ORAL_TABLET | Freq: Once | ORAL | Status: AC
  Administered 2016-11-05: 650 mg via ORAL
  Filled 2016-11-05: qty 2

## 2016-11-05 MED ORDER — IBUPROFEN 400 MG PO TABS
400.0000 mg | ORAL_TABLET | Freq: Once | ORAL | Status: AC
  Administered 2016-11-05: 400 mg via ORAL
  Filled 2016-11-05: qty 1

## 2016-11-05 MED ORDER — OXYCODONE-ACETAMINOPHEN 5-325 MG PO TABS
1.0000 | ORAL_TABLET | Freq: Once | ORAL | Status: AC
  Administered 2016-11-05: 1 via ORAL
  Filled 2016-11-05: qty 1

## 2016-11-05 NOTE — ED Provider Notes (Signed)
[facility] DEPT Provider Note   CSN: [id number] Arrival date & time: 11/05/16  1734     History   Chief Complaint Chief Complaint  Patient presents with  . Knee Injury    HPI Meagan Nelson is a 41 y.o. female with h/o HTN and obesity presents to ED for evaluation of sudden onset, consistent, left knee pain x 2 days. Pt fell off her bed that is placed high off the ground when she missed the step stool, landing directly on her left knee. Aggravating factors include weight bearing, flexion and palpation. 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 Not alleviating by ibuprofen. Denies h/o DM or gout. No IVDU. Denies fevers, warmth, body aches, chills, sweats.   HPI  Past Medical History:  Diagnosis Date  . Abscess    facial  . Anemia   . Headache   . History of blood transfusion   . Hypertension    PMH  . Insomnia     Patient Active Problem List   Diagnosis Date Noted  . Cellulitis of face   . Symptomatic anemia 09/01/2014  . Microcytic hypochromic anemia 09/01/2014  . Dental infection 09/01/2014    Past Surgical History:  Procedure Laterality Date  . CESAREAN SECTION    . DILATION AND CURETTAGE OF UTERUS    . TONSILLECTOMY      OB History    Gravida Para Term Preterm AB Living   7 0     1 5   SAB TAB Ectopic Multiple Live Births   1       5       Home Medications    Prior to Admission medications   Medication Sig Start Date End Date Taking? Authorizing Provider  cyclobenzaprine (FLEXERIL) 10 MG tablet Take 1 tablet (10 mg total) by mouth 2 (two) times daily as needed for muscle spasms. 04/11/15   Arthor Captain, PA-C  ferrous sulfate 325 (65 FE) MG tablet Take 1 tablet (325 mg total) by mouth 3 (three) times daily with meals. 09/03/14   Vassie Loll, MD  hydrochlorothiazide (HYDRODIURIL) 25 MG tablet Take 1 tablet (25 mg total) by mouth daily. Patient not taking: Reported on 02/16/2015 12/05/14   Judeth Horn, NP   megestrol (MEGACE) 40 MG tablet Take 2 tablets (80 mg total) by mouth 2 (two) times daily. 02/16/15   Levie Heritage, DO  metroNIDAZOLE (FLAGYL) 500 MG tablet Take 1 tablet (500 mg total) by mouth 2 (two) times daily. Patient not taking: Reported on 02/16/2015 12/05/14   Judeth Horn, NP  naproxen (NAPROSYN) 500 MG tablet Take 1 tablet (500 mg total) by mouth 2 (two) times daily with a meal. 06/11/15   Everlene Farrier, PA-C  oxyCODONE-acetaminophen (ROXICET) 5-325 MG tablet Take 1-2 tablets by mouth every 6 (six) hours as needed for severe pain. 04/11/15   Arthor Captain, PA-C  penicillin v potassium (VEETID) 500 MG tablet Take 1 tablet (500 mg total) by mouth 4 (four) times daily. 06/11/15   Everlene Farrier, PA-C    Family History Family History  Problem Relation Age of Onset  . Hypertension Mother   . Anemia Sister   . Anemia Brother   . Anemia Sister   . Anemia Other   . Hypertension Other     Social History Social History  Substance Use Topics  . Smoking status: Current Some Day Smoker    Packs/day: 0.10    Types: Cigarettes  .  Smokeless tobacco: Never Used     Comment: Quit smoking cigartees in 2009  . Alcohol use No     Allergies   Patient has no known allergies.   Review of Systems Review of Systems  Constitutional: Negative for fever.  Musculoskeletal: Positive for arthralgias, gait problem and joint swelling. Negative for myalgias.  Skin: Negative for color change and wound.     Physical Exam Updated Vital Signs BP (!) 171/106 (BP Location: Right Arm)   Pulse 87   Temp 98.5 F (36.9 C) (Oral)   Resp 16   Ht 5\' 5"  (1.651 m)   Wt 102.1 kg (225 lb)   LMP 10/05/2016   SpO2 100%   BMI 37.44 kg/m   Physical Exam  Constitutional: She is oriented to person, place, and time. She appears well-developed and well-nourished. No distress.  NAD.  HENT:  Head: Normocephalic and atraumatic.  Right Ear: External ear normal.  Left Ear: External ear normal.    Nose: Nose normal.  Eyes: Conjunctivae and EOM are normal. No scleral icterus.  Neck: Normal range of motion. Neck supple.  Cardiovascular: Normal rate, regular rhythm and normal heart sounds.   No murmur heard. Pulmonary/Chest: Effort normal and breath sounds normal. She has no wheezes.  Musculoskeletal: Normal range of motion. She exhibits edema and tenderness. She exhibits no deformity.  Medial and lateral edema without erythema or warmth Full PROM of knees bilaterally with normal patellar J tracking Medial or lateral joint line tenderness.   Tenderness over MCL, LCL, patellar tendon and quadriceps tendon. Positive McMurray's (lateral) Lateral laxity No bony tenderness over patella, fibular head or tibial tuberosity.      Negative Lachman's. Negative posterior drawer test.   No crepitus with knee ROM.  Patient able to bear weight in ED (4+ steps)  Neurological: She is alert and oriented to person, place, and time.  Skin: Skin is warm and dry. Capillary refill takes less than 2 seconds.  Psychiatric: She has a normal mood and affect. Her behavior is normal. Judgment and thought content normal.  Nursing note and vitals reviewed.    ED Treatments / Results  Labs (all labs ordered are listed, but only abnormal results are displayed) Labs Reviewed - No data to display  EKG  EKG Interpretation None       Radiology Dg Knee Complete 4 Views Left  Result Date: 11/05/2016 CLINICAL DATA:  Larey Seat getting out of bed August 19th. Worsening knee pain. EXAM: LEFT KNEE - COMPLETE 4+ VIEW COMPARISON:  None. FINDINGS: No evidence of fracture, dislocation, or joint effusion. Minimal infrapatellar enthesopathy. No evidence of arthropathy or other focal bone abnormality. Soft tissues are unremarkable. IMPRESSION: Negative. Electronically Signed   By: Awilda Metro M.D.   On: 11/05/2016 19:34    Procedures Procedures (including critical care time)  Medications Ordered in ED Medications   ibuprofen (ADVIL,MOTRIN) tablet 400 mg (400 mg Oral Given 11/05/16 1931)  acetaminophen (TYLENOL) tablet 650 mg (650 mg Oral Given 11/05/16 1930)  oxyCODONE-acetaminophen (PERCOCET/ROXICET) 5-325 MG per tablet 1 tablet (1 tablet Oral Given 11/05/16 1931)     Initial Impression / Assessment and Plan / ED Course  I have reviewed the triage vital signs and the nursing notes.  Pertinent labs & imaging results that were available during my care of the patient were reviewed by me and considered in my medical decision making (see chart for details).     41 year old female presents to the ED for evaluation of sudden onset left  knee pain after falling off her bed and landing directly on her knee. Exam shows lateral laxity and diffuse anterior knee tenderness and mild edema. Extremities neurovascularly intact distally. X-rays are negative. Suspect soft tissue injury. Will discharge with knee sleeve, crutches, high-dose NSAIDs, rice and follow-up with orthopedist for worsening or persistent pain. Patient may require MRI to rule out ligament or meniscal injury. Work note given. Patient verbalized understanding and is agreeable with plan. History and exam not consistent with septic arthritis or gout.  Final Clinical Impressions(s) / ED Diagnoses   Final diagnoses:  Acute pain of left knee    New Prescriptions Discharge Medication List as of 11/05/2016  7:42 PM       Liberty Handy, PA-C 11/05/16 2112    Mancel Bale, MD 11/06/16 [phone]

## 2016-11-05 NOTE — Discharge Instructions (Signed)
You were evaluated in the emergency department for left knee pain. Your x-rays did not show fractures, dislocations or fluid. Given your recent trauma, you may have injured a ligament or meniscus. Please contact orthopedist to arrange outpatient MRI and reevaluation if your pain worsens or continues after 7 days. Use your knee sleeve and crutches to avoid further injury. For inflammation and pain take ibuprofen 400 mg and 1000 mg of Tylenol every 8 hours. Ice and elevate to decrease swelling.

## 2016-11-05 NOTE — ED Triage Notes (Signed)
Pt arrives via POv from home with left knee pain after she fell out of the bed Sunday. Pt ambulatory. Pt states she's been working through the pain.

## 2016-11-07 ENCOUNTER — Telehealth: Payer: Self-pay | Admitting: *Deleted

## 2016-11-07 ENCOUNTER — Ambulatory Visit (HOSPITAL_COMMUNITY)
Admission: EM | Admit: 2016-11-07 | Discharge: 2016-11-07 | Disposition: A | Discharge status: Home / Self Care | Payer: No Typology Code available for payment source

## 2016-11-07 NOTE — Telephone Encounter (Signed)
Pt called regarding HTN Rx and pain Rx from her last visit.  EDCM reviewed chart to find that pt was NOT prescribed any medications at discharge.  Pt states she is still in pain and afraid because her BP was elevated.  EDCM advised that if she were still in pain, to visit nearest Urgent Care because pt does not have PCP (has appt with [facility] 8/31).  Pt repeats instructions.

## 2016-11-13 ENCOUNTER — Emergency Department (HOSPITAL_COMMUNITY): Payer: Self-pay

## 2016-11-13 ENCOUNTER — Encounter (HOSPITAL_COMMUNITY): Payer: Self-pay | Admitting: Emergency Medicine

## 2016-11-13 DIAGNOSIS — I10 Essential (primary) hypertension: Secondary | ICD-10-CM | POA: Insufficient documentation

## 2016-11-13 DIAGNOSIS — D649 Anemia, unspecified: Secondary | ICD-10-CM | POA: Insufficient documentation

## 2016-11-13 DIAGNOSIS — F17210 Nicotine dependence, cigarettes, uncomplicated: Secondary | ICD-10-CM | POA: Insufficient documentation

## 2016-11-13 DIAGNOSIS — Z79899 Other long term (current) drug therapy: Secondary | ICD-10-CM | POA: Insufficient documentation

## 2016-11-13 LAB — CBC
HCT: 30.7 % — ABNORMAL LOW (ref 36.0–46.0)
HEMOGLOBIN: 9 g/dL — AB (ref 12.0–15.0)
MCH: 21.3 pg — AB (ref 26.0–34.0)
MCHC: 29.3 g/dL — ABNORMAL LOW (ref 30.0–36.0)
MCV: 72.6 fL — AB (ref 78.0–100.0)
PLATELETS: 254 10*3/uL (ref 150–400)
RBC: 4.23 MIL/uL (ref 3.87–5.11)
RDW: 20.6 % — ABNORMAL HIGH (ref 11.5–15.5)
WBC: 12 10*3/uL — ABNORMAL HIGH (ref 4.0–10.5)

## 2016-11-13 LAB — BASIC METABOLIC PANEL
ANION GAP: 10 (ref 5–15)
BUN: 7 mg/dL (ref 6–20)
CO2: 24 mmol/L (ref 22–32)
Calcium: 8.4 mg/dL — ABNORMAL LOW (ref 8.9–10.3)
Chloride: 106 mmol/L (ref 101–111)
Creatinine, Ser: 0.64 mg/dL (ref 0.44–1.00)
GFR calc non Af Amer: >60 mL/min (ref >60)
GLUCOSE: 96 mg/dL (ref 65–99)
POTASSIUM: 3.4 mmol/L — AB (ref 3.5–5.1)
Sodium: 140 mmol/L (ref 135–145)

## 2016-11-13 LAB — I-STAT TROPONIN, ED: Troponin i, poc: 0 ng/mL (ref 0.00–0.08)

## 2016-11-13 NOTE — ED Triage Notes (Signed)
Pt states she feels very tier and weak, with intermittent cp, SOB and dizziness, pt states she has similar symptoms when her iron is low. No fever, chills, nausea or vomiting.

## 2016-11-14 ENCOUNTER — Ambulatory Visit: Payer: Self-pay | Attending: Internal Medicine | Admitting: Physician Assistant

## 2016-11-14 ENCOUNTER — Emergency Department (HOSPITAL_COMMUNITY)
Admission: EM | Admit: 2016-11-14 | Discharge: 2016-11-14 | Disposition: A | Discharge status: Home / Self Care | Payer: Self-pay | Attending: Emergency Medicine | Admitting: Emergency Medicine

## 2016-11-14 VITALS — BP 135/94 | HR 95 | Temp 98.1°F | Resp 18 | Ht 65.0 in | Wt 249.0 lb

## 2016-11-14 DIAGNOSIS — I10 Essential (primary) hypertension: Secondary | ICD-10-CM | POA: Insufficient documentation

## 2016-11-14 DIAGNOSIS — D649 Anemia, unspecified: Secondary | ICD-10-CM

## 2016-11-14 DIAGNOSIS — R030 Elevated blood-pressure reading, without diagnosis of hypertension: Secondary | ICD-10-CM

## 2016-11-14 DIAGNOSIS — M25562 Pain in left knee: Secondary | ICD-10-CM | POA: Insufficient documentation

## 2016-11-14 DIAGNOSIS — Z79899 Other long term (current) drug therapy: Secondary | ICD-10-CM | POA: Insufficient documentation

## 2016-11-14 DIAGNOSIS — M7989 Other specified soft tissue disorders: Secondary | ICD-10-CM | POA: Insufficient documentation

## 2016-11-14 DIAGNOSIS — D509 Iron deficiency anemia, unspecified: Secondary | ICD-10-CM

## 2016-11-14 DIAGNOSIS — G4700 Insomnia, unspecified: Secondary | ICD-10-CM | POA: Insufficient documentation

## 2016-11-14 MED ORDER — HYDROCHLOROTHIAZIDE 25 MG PO TABS
25.0000 mg | ORAL_TABLET | Freq: Once | ORAL | Status: AC
  Administered 2016-11-14: 25 mg via ORAL
  Filled 2016-11-14: qty 1

## 2016-11-14 MED ORDER — TRAMADOL HCL 50 MG PO TABS: 50.0000 mg | ORAL_TABLET | Freq: Three times a day (TID) | ORAL | 0 refills | Status: DC | PRN

## 2016-11-14 MED ORDER — HYDROCHLOROTHIAZIDE 25 MG PO TABS: 25.0000 mg | ORAL_TABLET | Freq: Every day | ORAL | 2 refills | Status: DC

## 2016-11-14 NOTE — Progress Notes (Signed)
Meagan Nelson  [id number]  [id number]  DOB - 10/08/1975  Chief Complaint  Patient presents with  . Knee Pain  . Hypertension       Subjective:   Meagan Nelson is a 41 y.o. female here today for establishment of care. She has a past medical history of anemia and hypertension. She's not seen a doctor in several years. She has been off of her blood pressure medication for at least 1-2 months. She has presented to emergency department twice in the last month. On 11/05/2016 she presented after falling out of her bed landing on her left knee. She's had decreased mobility and difficulty weightbearing since then. She's had swelling. Anti-inflammatories have not improved her pain. She went to the emergency department and her systolic blood pressure was 171. An x-ray was negative for fracture. She was given Percocet anti-inflammatories and crutches. She also was given a knee sleeve and told to use the rice method.  She went to the emergency department on 11/13/2016 with fatigue. She states that she felt like her iron was low and has had transfusions in the past. She has heavy menstrual cycles. She has seen GYN in the past and the medications that they put around have made her gain weight so she chooses not to do so. She does take iron over-the-counter once daily. She admits to some shortness of breath. Her systolic blood pressure this time was 198. Her potassium was 3.4. Her hemoglobin was 9.0. A chest x-ray was negative for an acute process. Her hydrochlorothiazide was refilled. She was told to follow-up here regards to the anemia.   ROS: GEN: denies fever or chills, denies change in weight Skin: denies lesions or rashes +fatigue HEENT: denies headache, earache, epistaxis, sore throat, or neck pain LUNGS: denies SHOB, dyspnea, PND, orthopnea CV: denies CP or palpitations ABD: denies abd pain, N or V EXT: denies muscle spasms or swelling; + pain in lower ext/left knee, no weakness NEURO:  denies numbness or tingling, denies sz, stroke or TIA  ALLERGIES: Allergies  Allergen Reactions  . Naprosyn [Naproxen] Nausea And Vomiting    PAST MEDICAL HISTORY: Past Medical History:  Diagnosis Date  . Abscess    facial  . Anemia   . Headache   . History of blood transfusion   . Hypertension    PMH  . Insomnia     PAST SURGICAL HISTORY: Past Surgical History:  Procedure Laterality Date  . CESAREAN SECTION    . DILATION AND CURETTAGE OF UTERUS    . TONSILLECTOMY      MEDICATIONS AT HOME: Prior to Admission medications   Medication Sig Start Date End Date Taking? Authorizing Provider  ferrous sulfate 325 (65 FE) MG tablet Take 1 tablet (325 mg total) by mouth 3 (three) times daily with meals. 09/03/14  Yes Vassie LollMadera, Carlos, MD  hydrochlorothiazide (HYDRODIURIL) 25 MG tablet Take 1 tablet (25 mg total) by mouth daily. 11/14/16   Vivianne MasterNoel, Tiffany S, PA-C  traMADol (ULTRAM) 50 MG tablet Take 1 tablet (50 mg total) by mouth every 8 (eight) hours as needed. 11/14/16   Vivianne MasterNoel, Tiffany S, PA-C    Family History  Problem Relation Age of Onset  . Hypertension Mother   . Anemia Sister   . Anemia Brother   . Anemia Sister   . Anemia Other   . Hypertension Other     @SOCIALHX @  Objective:   Vitals:   11/14/16 1425  BP: (!) 135/94  Pulse: 95  Resp: 18  Temp: 98.1 F (36.7 C)  TempSrc: Oral  SpO2: 99%  Weight: 249 lb (112.9 kg)  Height: 5\' 5"  (1.651 m)    Exam General appearance : Awake, alert, not in any distress. Speech Clear. Not toxic looking HEENT: Atraumatic and Normocephalic, pupils equally reactive to light and accomodation Neck: supple, no JVD. No cervical lymphadenopathy.  Chest:Good air entry bilaterally, no added sounds  CVS: S1 S2 regular, no murmurs.  Abdomen: Bowel sounds present, Non tender and not distended with no guarding, rigidity or rebound. Extremities: B/L Lower Ext shows no edema, both legs are warm to touch Neurology: Awake alert, and oriented  X 3, CN II-XII intact, Non focal Skin:No Rash Wounds:N/A  Data Review No results found for: HGBA1C   Assessment & Plan  1. Left knee pain/swelling  -MRI left knee  -ortho surgery referral  -NSAID, RICE, Tramadol 2. HTN  -refilled HCTZ   -BMP at next visit  -DASH diet 3. Anemia likely 2/2 DUB  -Cont Fe supplements, inc BID  -not interested in GYN referral at this time   Return in about 3 weeks (around 12/05/2016).  The patient was given clear instructions to go to ER or return to medical center if symptoms don't improve, worsen or new problems develop. The patient verbalized understanding. The patient was told to call to get lab results if they haven't heard anything in the next week.   Total time spent with patient was 19 min. Greater than 50 % of this visit was spent face to face counseling and coordinating care regarding risk factor modification, compliance importance and encouragement, education related to knee pain, imaging and blood pressure.  This note has been created with Education officer, environmental. Any transcriptional errors are unintentional.    Scot Jun, PA-C [facility] [place], [place] [phone]   11/14/2016, 2:48 PM

## 2016-11-14 NOTE — ED Notes (Signed)
RN discussed BP with pt and importance of taking BP meds and keeping f/u appointment for tomorrow.

## 2016-11-14 NOTE — ED Notes (Signed)
ED Provider at bedside. 

## 2016-11-14 NOTE — Discharge Instructions (Signed)
Please follow-up with your doctor tomorrow.    You will need to address your blood pressure with your doctor as well as your anemia and heavy menstrual bleeding.  You will likely need to follow-up with an OBGYN.  [facility] [place].  Please make an appointment.  Continue taking your iron.  Return if your symptoms worsen.

## 2016-11-14 NOTE — ED Provider Notes (Signed)
[facility] Provider Note   CSN: [id number] Arrival date & time: 11/13/16  2119     History   Chief Complaint Chief Complaint  Patient presents with  . Shortness of Breath  . Weakness    HPI Lupita RaiderKeshia Kleckner is a 41 y.o. female.  Patient presents to the ED with a chief complaint of fatigue.  She states that her symptoms started earlier today, and states that her symptoms are similar to when her "iron is low."  She has required a transfusion in the past, and is concerned about needing another.  She states that she has heavy menstrual periods, which she is currently finishing.  She denies any fevers, chills, cough, chest pain.  She states that she sometimes feels short of breath, but mostly feels fatigued.  She states that she is also out of her BP meds, and knows that her BP is running high.  She reports having an appointment with her doctor tomorrow.   The history is provided by the patient. No language interpreter was used.  Weakness     Past Medical History:  Diagnosis Date  . Abscess    facial  . Anemia   . Headache   . History of blood transfusion   . Hypertension    PMH  . Insomnia     Patient Active Problem List   Diagnosis Date Noted  . Cellulitis of face   . Symptomatic anemia 09/01/2014  . Microcytic hypochromic anemia 09/01/2014  . Dental infection 09/01/2014    Past Surgical History:  Procedure Laterality Date  . CESAREAN SECTION    . DILATION AND CURETTAGE OF UTERUS    . TONSILLECTOMY      OB History    Gravida Para Term Preterm AB Living   7 0     1 5   SAB TAB Ectopic Multiple Live Births   1       5       Home Medications    Prior to Admission medications   Medication Sig Start Date End Date Taking? Authorizing Provider  cyclobenzaprine (FLEXERIL) 10 MG tablet Take 1 tablet (10 mg total) by mouth 2 (two) times daily as needed for muscle spasms. 04/11/15   Arthor CaptainHarris, Abigail, PA-C  ferrous sulfate 325 (65 FE) MG tablet Take 1 tablet  (325 mg total) by mouth 3 (three) times daily with meals. 09/03/14   Vassie LollMadera, Carlos, MD  hydrochlorothiazide (HYDRODIURIL) 25 MG tablet Take 1 tablet (25 mg total) by mouth daily. Patient not taking: Reported on 02/16/2015 12/05/14   Judeth HornLawrence, Erin, NP  megestrol (MEGACE) 40 MG tablet Take 2 tablets (80 mg total) by mouth 2 (two) times daily. 02/16/15   Levie HeritageStinson, Jacob J, DO  metroNIDAZOLE (FLAGYL) 500 MG tablet Take 1 tablet (500 mg total) by mouth 2 (two) times daily. Patient not taking: Reported on 02/16/2015 12/05/14   Judeth HornLawrence, Erin, NP  naproxen (NAPROSYN) 500 MG tablet Take 1 tablet (500 mg total) by mouth 2 (two) times daily with a meal. 06/11/15   Everlene Farrieransie, William, PA-C  oxyCODONE-acetaminophen (ROXICET) 5-325 MG tablet Take 1-2 tablets by mouth every 6 (six) hours as needed for severe pain. 04/11/15   Arthor CaptainHarris, Abigail, PA-C  penicillin v potassium (VEETID) 500 MG tablet Take 1 tablet (500 mg total) by mouth 4 (four) times daily. 06/11/15   Everlene Farrieransie, William, PA-C    Family History Family History  Problem Relation Age of Onset  . Hypertension Mother   . Anemia Sister   .  Anemia Brother   . Anemia Sister   . Anemia Other   . Hypertension Other     Social History Social History  Substance Use Topics  . Smoking status: Current Some Day Smoker    Packs/day: 0.10    Types: Cigarettes  . Smokeless tobacco: Never Used     Comment: Quit smoking cigartees in 2009  . Alcohol use No     Allergies   Patient has no known allergies.   Review of Systems Review of Systems  All other systems reviewed and are negative.    Physical Exam Updated Vital Signs BP (!) 198/139   Pulse 74   Temp 98.6 F (37 C) (Oral)   Resp 18   Ht 5\' 5"  (1.651 m)   Wt 102.1 kg (225 lb)   LMP 11/12/2016   SpO2 100%   BMI 37.44 kg/m   Physical Exam  Constitutional: She is oriented to person, place, and time. She appears well-developed and well-nourished.  HENT:  Head: Normocephalic and atraumatic.    Eyes: Pupils are equal, round, and reactive to light. Conjunctivae and EOM are normal.  Neck: Normal range of motion. Neck supple.  Cardiovascular: Normal rate and regular rhythm.  Exam reveals no gallop and no friction rub.   No murmur heard. Pulmonary/Chest: Effort normal and breath sounds normal. No respiratory distress. She has no wheezes. She has no rales. She exhibits no tenderness.  Abdominal: Soft. Bowel sounds are normal. She exhibits no distension and no mass. There is no tenderness. There is no rebound and no guarding.  Musculoskeletal: Normal range of motion. She exhibits no edema or tenderness.  Neurological: She is alert and oriented to person, place, and time.  Skin: Skin is warm and dry.  Psychiatric: She has a normal mood and affect. Her behavior is normal. Judgment and thought content normal.  Nursing note and vitals reviewed.    ED Treatments / Results  Labs (all labs ordered are listed, but only abnormal results are displayed) Labs Reviewed  BASIC METABOLIC PANEL - Abnormal; Notable for the following:       Result Value   Potassium 3.4 (*)    Calcium 8.4 (*)    All other components within normal limits  CBC - Abnormal; Notable for the following:    WBC 12.0 (*)    Hemoglobin 9.0 (*)    HCT 30.7 (*)    MCV 72.6 (*)    MCH 21.3 (*)    MCHC 29.3 (*)    RDW 20.6 (*)    All other components within normal limits  I-STAT TROPONIN, ED    EKG  EKG Interpretation None       Radiology Dg Chest 2 View  Result Date: 11/13/2016 CLINICAL DATA:  Intermittent chest pain and dyspnea.  Fatigue. EXAM: CHEST  2 VIEW COMPARISON:  None. FINDINGS: The lungs are clear. The pulmonary vasculature is normal. Heart size is normal. Hilar and mediastinal contours are unremarkable. There is no pleural effusion. IMPRESSION: No active cardiopulmonary disease. Electronically Signed   By: Ellery Plunk M.D.   On: 11/13/2016 22:29    Procedures Procedures (including critical  care time)  Medications Ordered in ED Medications  hydrochlorothiazide (HYDRODIURIL) tablet 25 mg (25 mg Oral Given 11/14/16 0125)     Initial Impression / Assessment and Plan / ED Course  I have reviewed the triage vital signs and the nursing notes.  Pertinent labs & imaging results that were available during my care of the patient  were reviewed by me and considered in my medical decision making (see chart for details).     Patient with history of anemia requiring transfusion.  She doesn't need to be transfused tonight.  Her HGB is 9.0.  She has a follow-up appointment with her doctor tomorrow morning.  Will have her address this with them, but have also given her information for the [facility].  Her BP is elevated, but she has been out of her meds.  There is no evidence of end organ damage.  I have given her a dose of her regular meds here.  Patient discussed with Dr. Judd Lien, who agrees that she doesn't require any further emergent workup or intervention tonight and can be discharged to home given that she has follow-up tomorrow.  Final Clinical Impressions(s) / ED Diagnoses   Final diagnoses:  Anemia, unspecified type  Elevated blood pressure reading    New Prescriptions New Prescriptions   No medications on file     Roxy Horseman, Cordelia Poche 11/14/16 0214    Geoffery Lyons, MD 11/14/16 [phone]

## 2016-12-22 ENCOUNTER — Ambulatory Visit (INDEPENDENT_AMBULATORY_CARE_PROVIDER_SITE_OTHER): Payer: Self-pay | Admitting: Orthopaedic Surgery

## 2016-12-23 ENCOUNTER — Ambulatory Visit: Payer: Self-pay | Admitting: Family Medicine

## 2017-01-19 ENCOUNTER — Encounter (HOSPITAL_COMMUNITY): Payer: Self-pay | Admitting: *Deleted

## 2017-01-19 ENCOUNTER — Inpatient Hospital Stay (HOSPITAL_COMMUNITY)
Admission: AD | Admit: 2017-01-19 | Discharge: 2017-01-19 | Disposition: A | Discharge status: Home / Self Care | Payer: Self-pay | Source: Ambulatory Visit | Attending: Obstetrics & Gynecology | Admitting: Obstetrics & Gynecology

## 2017-01-19 DIAGNOSIS — Z79899 Other long term (current) drug therapy: Secondary | ICD-10-CM | POA: Insufficient documentation

## 2017-01-19 DIAGNOSIS — R42 Dizziness and giddiness: Secondary | ICD-10-CM | POA: Insufficient documentation

## 2017-01-19 DIAGNOSIS — N939 Abnormal uterine and vaginal bleeding, unspecified: Secondary | ICD-10-CM | POA: Diagnosis present

## 2017-01-19 DIAGNOSIS — D649 Anemia, unspecified: Secondary | ICD-10-CM | POA: Diagnosis present

## 2017-01-19 DIAGNOSIS — G4700 Insomnia, unspecified: Secondary | ICD-10-CM | POA: Insufficient documentation

## 2017-01-19 DIAGNOSIS — I10 Essential (primary) hypertension: Secondary | ICD-10-CM | POA: Insufficient documentation

## 2017-01-19 DIAGNOSIS — F17210 Nicotine dependence, cigarettes, uncomplicated: Secondary | ICD-10-CM | POA: Insufficient documentation

## 2017-01-19 LAB — URINALYSIS, ROUTINE W REFLEX MICROSCOPIC
BACTERIA UA: NONE SEEN
Bilirubin Urine: NEGATIVE
Glucose, UA: NEGATIVE mg/dL
KETONES UR: NEGATIVE mg/dL
Leukocytes, UA: NEGATIVE
Nitrite: NEGATIVE
PROTEIN: NEGATIVE mg/dL
Specific Gravity, Urine: 1.019 (ref 1.005–1.030)
pH: 5 (ref 5.0–8.0)

## 2017-01-19 LAB — CBC
HCT: 23.3 % — ABNORMAL LOW (ref 36.0–46.0)
Hemoglobin: 6.6 g/dL — CL (ref 12.0–15.0)
MCH: 19.1 pg — ABNORMAL LOW (ref 26.0–34.0)
MCHC: 28.3 g/dL — ABNORMAL LOW (ref 30.0–36.0)
MCV: 67.3 fL — ABNORMAL LOW (ref 78.0–100.0)
PLATELETS: 248 10*3/uL (ref 150–400)
RBC: 3.46 MIL/uL — AB (ref 3.87–5.11)
RDW: 21.2 % — ABNORMAL HIGH (ref 11.5–15.5)
WBC: 12.4 10*3/uL — AB (ref 4.0–10.5)

## 2017-01-19 LAB — WET PREP, GENITAL
CLUE CELLS WET PREP: NONE SEEN
Sperm: NONE SEEN
Trich, Wet Prep: NONE SEEN
YEAST WET PREP: NONE SEEN

## 2017-01-19 LAB — POCT PREGNANCY, URINE: PREG TEST UR: NEGATIVE

## 2017-01-19 MED ORDER — SODIUM CHLORIDE 0.9 % IV SOLN
INTRAVENOUS | Status: DC
  Administered 2017-01-19: 11:00:00 via INTRAVENOUS

## 2017-01-19 MED ORDER — MEGESTROL ACETATE 40 MG PO TABS: 40.0000 mg | ORAL_TABLET | Freq: Two times a day (BID) | ORAL | 0 refills | Status: DC

## 2017-01-19 MED ORDER — SODIUM CHLORIDE 0.9 % IV SOLN
510.0000 mg | Freq: Once | INTRAVENOUS | Status: AC
  Administered 2017-01-19: 510 mg via INTRAVENOUS
  Filled 2017-01-19: qty 17

## 2017-01-19 MED ORDER — FERROUS SULFATE 325 (65 FE) MG PO TABS: 325.0000 mg | ORAL_TABLET | Freq: Three times a day (TID) | ORAL | 3 refills | Status: DC

## 2017-01-19 NOTE — MAU Note (Signed)
Pt states she has been bleeding since around Oct 16 without stopping, heavy.  Has to wear a tampon & two pads, changing every 35 minutes.  Passing fist size clots.  States this happened before & then had a polyp removed which helped.  Denies pain.

## 2017-01-19 NOTE — Discharge Instructions (Signed)
A Pelvic ultrasound has been ordered for you.  Someone from the ultrasound department will call you to schedule that.  If you have not heard from that department in 1 week, please call [phone].

## 2017-01-19 NOTE — MAU Provider Note (Signed)
History     CSN: [id number]  Arrival date and time: 01/19/17 [id number]   First Provider Initiated Contact with Patient 01/19/17 0913      Chief Complaint  Patient presents with  . Vaginal Bleeding   HPI  Meagan Nelson is a 41 y.o. non-pregnant female presenting to MAU with complaints of heavy VB since 12/30/16; without stopping. She reports wearing a tampon and 2 pads changing them every 30 minutes, passing clots the size of her fist. She had some dizziness when she didn't take her iron supplements, but resolved once she took it. She states this has happened about 2 yrs ago resulting in her having a polyp removed; which helped. Denies pain.  Past Medical History:  Diagnosis Date  . Abscess    facial  . Anemia   . Headache   . History of blood transfusion   . Hypertension    PMH  . Insomnia     Past Surgical History:  Procedure Laterality Date  . CESAREAN SECTION     C/S x 1  . DILATION AND CURETTAGE OF UTERUS    . TONSILLECTOMY      Family History  Problem Relation Age of Onset  . Hypertension Mother   . Anemia Sister   . Anemia Brother   . Anemia Sister   . Anemia Other   . Hypertension Other     Social History   Tobacco Use  . Smoking status: Current Some Day Smoker    Packs/day: 0.10    Types: Cigarettes  . Smokeless tobacco: Never Used  . Tobacco comment: Quit smoking cigartees in 2009  Substance Use Topics  . Alcohol use: No  . Drug use: No    Allergies:  Allergies  Allergen Reactions  . Naprosyn [Naproxen] Nausea And Vomiting    Medications Prior to Admission  Medication Sig Dispense Refill Last Dose  . ferrous sulfate 325 (65 FE) MG tablet Take 1 tablet (325 mg total) by mouth 3 (three) times daily with meals. (Patient taking differently: Take 325 mg daily with breakfast by mouth. ) 90 tablet 3 01/19/2017 at Unknown time  . hydrochlorothiazide (HYDRODIURIL) 25 MG tablet Take 1 tablet (25 mg total) by mouth daily. 30 tablet 2 01/19/2017 at  Unknown time  . traMADol (ULTRAM) 50 MG tablet Take 1 tablet (50 mg total) by mouth every 8 (eight) hours as needed. (Patient not taking: Reported on 01/19/2017) 40 tablet 0 Not Taking at Unknown time    Review of Systems  Constitutional: Negative.   HENT: Negative.   Eyes: Negative.   Respiratory: Negative.   Cardiovascular: Negative.   Gastrointestinal: Negative.   Endocrine: Negative.   Genitourinary: Positive for vaginal bleeding.  Musculoskeletal: Negative.   Skin: Negative.   Allergic/Immunologic: Negative.   Neurological: Negative.   Hematological: Negative.   Psychiatric/Behavioral: Negative.    Physical Exam   Temperature 98.4 F (36.9 C), temperature source Oral, resp. rate 18, height 5\' 6"  (1.676 m), weight 249 lb 0.6 oz (113 kg), last menstrual period 12/30/2016, SpO2 99 %. Patient Vitals for the past 24 hrs:  BP Temp Temp src Pulse Resp SpO2 Height Weight  01/19/17 0849 134/78 - - - - - - -  01/19/17 0830 138/78 98.4 F (36.9 C) Oral (!) 110 18 99 % 5\' 6"  (1.676 m) 249 lb 0.6 oz (113 kg)    Physical Exam  Nursing note and vitals reviewed. Constitutional: She is oriented to person, place, and time. She appears well-developed and  well-nourished.  No signs of dizziness, blurry vision  HENT:  Head: Normocephalic.  Eyes: Pupils are equal, round, and reactive to light.  Neck: Normal range of motion.  Cardiovascular: Normal rate, normal heart sounds and intact distal pulses.  Tachycardic in triage  Respiratory: Effort normal and breath sounds normal.  GI: Soft. Bowel sounds are normal.  Genitourinary:  Genitourinary Comments: Uterus: difficult to assess size d/t body habitus, cx: smooth, pink, no lesions, small amt of dark, red blood and small clot removed with 3 large cotton swabs, wet prep and GC/CT obtained, closed/long/firm, no CMT or friability, no adnexal tenderness   Musculoskeletal: Normal range of motion.  Neurological: She is alert and oriented to person,  place, and time.  Skin: Skin is warm and dry.  Psychiatric: She has a normal mood and affect. Her behavior is normal. Judgment and thought content normal.   MAU Course  Procedures  MDM [facility] UPT CBC Orthostatic VS Feraheme x 1 dose prior to d/c home  Results for orders placed or performed during the hospital encounter of 01/19/17 (from the past 24 hour(s))  Urinalysis, Routine w reflex microscopic     Status: Abnormal   Collection Time: 01/19/17  8:36 AM  Result Value Ref Range   Color, Urine YELLOW YELLOW   APPearance CLEAR CLEAR   Specific Gravity, Urine 1.019 1.005 - 1.030   pH 5.0 5.0 - 8.0   Glucose, UA NEGATIVE NEGATIVE mg/dL   Hgb urine dipstick MODERATE (A) NEGATIVE   Bilirubin Urine NEGATIVE NEGATIVE   Ketones, ur NEGATIVE NEGATIVE mg/dL   Protein, ur NEGATIVE NEGATIVE mg/dL   Nitrite NEGATIVE NEGATIVE   Leukocytes, UA NEGATIVE NEGATIVE   RBC / HPF 0-5 0 - 5 RBC/hpf   WBC, UA 0-5 0 - 5 WBC/hpf   Bacteria, UA NONE SEEN NONE SEEN   Squamous Epithelial / LPF 0-5 (A) NONE SEEN   Mucus PRESENT    Hyaline Casts, UA PRESENT   Pregnancy, urine POC     Status: None   Collection Time: 01/19/17  8:51 AM  Result Value Ref Range   Preg Test, Ur NEGATIVE NEGATIVE  Wet prep, genital     Status: Abnormal   Collection Time: 01/19/17  9:36 AM  Result Value Ref Range   Yeast Wet Prep HPF POC NONE SEEN NONE SEEN   Trich, Wet Prep NONE SEEN NONE SEEN   Clue Cells Wet Prep HPF POC NONE SEEN NONE SEEN   WBC, Wet Prep HPF POC FEW (A) NONE SEEN   Sperm NONE SEEN   CBC     Status: Abnormal (Preliminary result)   Collection Time: 01/19/17  9:41 AM  Result Value Ref Range   WBC PENDING 4.0 - 10.5 K/uL   RBC 3.46 (L) 3.87 - 5.11 MIL/uL   Hemoglobin 6.6 (LL) 12.0 - 15.0 g/dL   HCT 60.4 (L) 54.0 - 98.1 %   MCV 67.3 (L) 78.0 - 100.0 fL   MCH 19.1 (L) 26.0 - 34.0 pg   MCHC 28.3 (L) 30.0 - 36.0 g/dL   RDW 19.1 (H) 47.8 - 29.5 %   Platelets PENDING 150 - 400 K/uL   Assessment and  Plan  Abnormal uterine bleeding - Plan: Outpt US PELVIS (TRANSABDOMINAL ONLY) - F/U appt with [facility] after U/S - Rx for Megace 80 mg BID x 3 days, then 40 mg BID until bleeding stops - F/U with MD in [facility] to review U/S results and discuss POC  Symptomatic anemia - Take iron  supplements as scheduled --> 325 mg TID  - Encouraged iron rich diet    Discharge home Patient verbalized an understanding of the plan of care and agrees.   Raelyn Mora, MSN, CNM 01/19/2017, 9:16 AM

## 2017-01-19 NOTE — MAU Note (Signed)
CRITICAL VALUE ALERT  Critical Value:  Hgb 6.6  Date & Time Notied:  01/19/17 1015  Provider Notified: Carloyn Jaeger. Dawson CNM  Orders Received/Actions taken: No new orders @ this time.

## 2017-01-20 ENCOUNTER — Other Ambulatory Visit (HOSPITAL_COMMUNITY): Payer: Self-pay | Admitting: Obstetrics and Gynecology

## 2017-01-20 LAB — GC/CHLAMYDIA PROBE AMP (~~LOC~~) NOT AT ARMC
Chlamydia: NEGATIVE
Neisseria Gonorrhea: NEGATIVE

## 2017-01-22 ENCOUNTER — Other Ambulatory Visit: Payer: Self-pay | Admitting: Obstetrics and Gynecology

## 2017-01-22 DIAGNOSIS — N939 Abnormal uterine and vaginal bleeding, unspecified: Secondary | ICD-10-CM

## 2017-01-26 ENCOUNTER — Ambulatory Visit (HOSPITAL_COMMUNITY)
Admission: RE | Admit: 2017-01-26 | Discharge: 2017-01-26 | Disposition: A | Discharge status: Home / Self Care | Payer: Self-pay | Source: Ambulatory Visit | Attending: Obstetrics and Gynecology | Admitting: Obstetrics and Gynecology

## 2017-01-26 DIAGNOSIS — N939 Abnormal uterine and vaginal bleeding, unspecified: Secondary | ICD-10-CM | POA: Insufficient documentation

## 2017-01-26 DIAGNOSIS — R9389 Abnormal findings on diagnostic imaging of other specified body structures: Secondary | ICD-10-CM | POA: Insufficient documentation

## 2017-02-13 ENCOUNTER — Ambulatory Visit: Payer: Self-pay | Admitting: Obstetrics & Gynecology

## 2017-02-13 ENCOUNTER — Encounter: Payer: Self-pay | Admitting: Obstetrics & Gynecology

## 2017-02-13 VITALS — BP 120/88 | HR 78 | Ht 65.0 in | Wt 248.9 lb

## 2017-02-13 DIAGNOSIS — N938 Other specified abnormal uterine and vaginal bleeding: Secondary | ICD-10-CM

## 2017-02-13 DIAGNOSIS — D500 Iron deficiency anemia secondary to blood loss (chronic): Secondary | ICD-10-CM

## 2017-02-13 MED ORDER — MEGESTROL ACETATE 40 MG PO TABS: 40.0000 mg | ORAL_TABLET | Freq: Two times a day (BID) | ORAL | 5 refills | Status: DC

## 2017-02-13 NOTE — Progress Notes (Signed)
Patient ID: Meagan Nelson, female   DOB: 03/02/1976, 41 y.o.   MRN: [id number]  Chief Complaint  Patient presents with  . Results    HPI Meagan Nelson is a 41 y.o. female. P7 with 6 living children here for MAU follow up. She was seen there for heavy bleeding. Periods now lasting up to 2 weeks. Her hbg was 6 and her u/s showed polyps. She does not feel like she needs a transfusion at this time. [facility] when her hbg got to 3. HPI  Past Medical History:  Diagnosis Date  . Abscess    facial  . Anemia   . Headache   . History of blood transfusion   . Hypertension    PMH  . Insomnia     Past Surgical History:  Procedure Laterality Date  . CESAREAN SECTION     C/S x 1  . DILATION AND CURETTAGE OF UTERUS    . TONSILLECTOMY      Family History  Problem Relation Age of Onset  . Hypertension Mother   . Anemia Sister   . Anemia Brother   . Anemia Sister   . Anemia Other   . Hypertension Other     Social History Social History   Tobacco Use  . Smoking status: Current Some Day Smoker    Packs/day: 0.10    Types: Cigarettes  . Smokeless tobacco: Never Used  . Tobacco comment: Quit smoking cigartees in 2009  Substance Use Topics  . Alcohol use: No  . Drug use: No    Allergies  Allergen Reactions  . Naprosyn [Naproxen] Nausea And Vomiting    Current Outpatient Medications  Medication Sig Dispense Refill  . ferrous sulfate 325 (65 FE) MG tablet Take 1 tablet (325 mg total) 3 (three) times daily with meals by mouth. 90 tablet 3  . hydrochlorothiazide (HYDRODIURIL) 25 MG tablet Take 1 tablet (25 mg total) by mouth daily. 30 tablet 2  . megestrol (MEGACE) 40 MG tablet Take 1 tablet (40 mg total) 2 (two) times daily by mouth. Take 80 mg BID x 3 days, then 40 mg BID until bleeding stops and/or seen by MD. 60 tablet 0   No current facility-administered medications for this visit.     Review of Systems Review of Systems  Works at [employer]  [employer], [employer] Doesn't use contraception  Blood pressure 120/88, pulse 78, height 5\' 5"  (1.651 m), weight 248 lb 14.4 oz (112.9 kg).  Physical Exam Physical Exam  Data Reviewed Results for Meagan Nelson, Meagan Nelson (MRN [id number]) as of 02/13/2017 11:02  Ref. Range 01/19/2017 09:41  WBC Latest Ref Range: 4.0 - 10.5 K/uL 12.4 (H)  RBC Latest Ref Range: 3.87 - 5.11 MIL/uL 3.46 (L)  Hemoglobin Latest Ref Range: 12.0 - 15.0 g/dL 6.6 (LL)  HCT Latest Ref Range: 36.0 - 46.0 % 23.3 (L)  MCV Latest Ref Range: 78.0 - 100.0 fL 67.3 (L)  MCH Latest Ref Range: 26.0 - 34.0 pg 19.1 (L)  MCHC Latest Ref Range: 30.0 - 36.0 g/dL 13.028.3 (L)  RDW Latest Ref Range: 11.5 - 15.5 % 21.2 (H)  Platelets Latest Ref Range: 150 - 400 K/uL 248   FINDINGS: Uterus  Measurements: 10.3 x 6.6 x 6.9 cm. No fibroids or other mass visualized.  Endometrium  Thickness: Difficult to delineate an measure, approximately 13 mm in thickness. Question to polypoid areas in the endometrium, 1 measuring up to 2.7 cm and the second  measuring up to 1.6 cm.  Right ovary  Measurements: 3.6 x 2.5 x 2.4 cm. Normal appearance/no adnexal mass.  Left ovary  Measurements: 2.8 x 1.9 x 1.8 cm. Normal appearance/no adnexal mass.  Other findings  No abnormal free fluid.  IMPRESSION: Heterogeneous appearance of the endometrium which is difficult to fully delineate, but there is suspicion for wanted to polypoid areas within the endometrium. Consider further evaluation with sonohysterogram for confirmation prior to hysteroscopy. Endometrial sampling should also be considered if patient is at high risk for endometrial carcinoma. (Ref: Radiological Reasoning: Algorithmic Workup of Abnormal Vaginal Bleeding with Endovaginal Sonography and Sonohysterography. AJR 2008; 409:W11-91; 191:S68-73)  Assessment    Preventative care    Plan    [facility] She will get the number for the free pap  clinic Schedule d&c Continue megace Continue iron pills Continue PNVs       Kade Demicco C Amyjo Mizrachi 02/13/2017, 11:02 AM

## 2017-02-16 ENCOUNTER — Encounter (HOSPITAL_COMMUNITY): Payer: Self-pay

## 2017-02-16 ENCOUNTER — Telehealth: Payer: Self-pay | Admitting: Family Medicine

## 2017-02-16 NOTE — Telephone Encounter (Signed)
Have questions regarding her D& C also [facility] and [facility] not [facility] at this time want to know what to do.

## 2017-02-17 ENCOUNTER — Ambulatory Visit: Payer: Self-pay | Attending: Nurse Practitioner | Admitting: Nurse Practitioner

## 2017-02-17 ENCOUNTER — Encounter: Payer: Self-pay | Admitting: Nurse Practitioner

## 2017-02-17 VITALS — BP 126/85 | HR 86 | Temp 98.5°F | Ht 65.0 in | Wt 254.2 lb

## 2017-02-17 DIAGNOSIS — N938 Other specified abnormal uterine and vaginal bleeding: Secondary | ICD-10-CM | POA: Insufficient documentation

## 2017-02-17 DIAGNOSIS — G4700 Insomnia, unspecified: Secondary | ICD-10-CM | POA: Insufficient documentation

## 2017-02-17 DIAGNOSIS — I10 Essential (primary) hypertension: Secondary | ICD-10-CM | POA: Insufficient documentation

## 2017-02-17 DIAGNOSIS — Z832 Family history of diseases of the blood and blood-forming organs and certain disorders involving the immune mechanism: Secondary | ICD-10-CM | POA: Insufficient documentation

## 2017-02-17 DIAGNOSIS — F17210 Nicotine dependence, cigarettes, uncomplicated: Secondary | ICD-10-CM | POA: Insufficient documentation

## 2017-02-17 DIAGNOSIS — Z8249 Family history of ischemic heart disease and other diseases of the circulatory system: Secondary | ICD-10-CM | POA: Insufficient documentation

## 2017-02-17 DIAGNOSIS — F17200 Nicotine dependence, unspecified, uncomplicated: Secondary | ICD-10-CM

## 2017-02-17 DIAGNOSIS — Z9889 Other specified postprocedural states: Secondary | ICD-10-CM | POA: Insufficient documentation

## 2017-02-17 DIAGNOSIS — N920 Excessive and frequent menstruation with regular cycle: Secondary | ICD-10-CM | POA: Insufficient documentation

## 2017-02-17 DIAGNOSIS — Z886 Allergy status to analgesic agent status: Secondary | ICD-10-CM | POA: Insufficient documentation

## 2017-02-17 DIAGNOSIS — D649 Anemia, unspecified: Secondary | ICD-10-CM | POA: Insufficient documentation

## 2017-02-17 MED ORDER — HYDROCHLOROTHIAZIDE 25 MG PO TABS: 25.0000 mg | ORAL_TABLET | Freq: Every day | ORAL | 1 refills | Status: DC

## 2017-02-17 MED ORDER — HYDROCHLOROTHIAZIDE 25 MG PO TABS: 25.0000 mg | ORAL_TABLET | Freq: Every day | ORAL | 2 refills | Status: DC

## 2017-02-17 NOTE — Patient Instructions (Addendum)
1. Can PAP be done with hysteroscopy?  2. Does PAP need to be done before surgery? 3. Will my polyps return after this surgery?  4.  Is a partal hysterectomy an option?

## 2017-02-17 NOTE — Progress Notes (Signed)
Assessment & Plan:  Meagan Nelson was seen today for establish care.  Diagnoses and all orders for this visit:  Essential hypertension -     hydrochlorothiazide (HYDRODIURIL) 25 MG tablet; Take 1 tablet (25 mg total) by mouth daily. Continue all antihypertensives as prescribed.  Remember to bring in your blood pressure log with you for your follow up appointment.  DASH/Mediterranean Diets are healthier choices for HTN.   Tobacco Dependence Meagan Nelson was counseled on the dangers of tobacco use, and was advised to quit. Reviewed strategies to maximize success, including removing cigarettes and smoking materials from environment, stress management and support of family/friends as well as pharmacological alternatives including: Wellbutrin, Chantix, Nicotine patch, Nicotine gum or lozenges. Smoking cessation support: smoking cessation hotline: 1-800-QUIT-NOW.  Smoking cessation classes are also available through [facility] and [facility]. Call [phone] or visit our website at [email or web address].   Spent 3 minutes counseling on smoking cessation and patient is not ready to quit.  Patient has been counseled on age-appropriate routine health concerns for screening and prevention. These are reviewed and up-to-date. Referrals have been placed accordingly. Immunizations are up-to-date or declined.    Subjective:   Chief Complaint  Patient presents with  . Establish Care    Patient stated need high blood pressure medication refills. Patient stated she ran out 4 days ago.    HPI Meagan Nelson 41 y.o. female presents to office today to establish care and for follow up HTN. She also has a history of anemia due to DUB.    Hypertension This is a longstanding condition for Meagan Nelson. She endorses medication compliance however is not diet compliant. Spot checking her BP at home. Averaging 130/70-80s. BP is well controlled on HCTZ 25mg . She denies chest pain, shortness of breath, palpitations,  lightheadedness, dizziness, headaches or BLE edema. She is not fasting today. Unable to draw fasting lipid panel.  BP Readings from Last 3 Encounters:  02/17/17 126/85  02/13/17 120/88  01/19/17 (!) 155/86   Tobacco Dependence Started back smoking but reports only smoking one cigarette per day. She has been smoking since she was 41 years old.   Anemia She sees GYN for DUB with menorrhagia. She has a D&C with hysteroscopy scheduled for 03-11-2017.    Review of Systems  Constitutional: Positive for malaise/fatigue. Negative for fever and weight loss.  HENT: Negative.  Negative for nosebleeds.   Eyes: Negative.  Negative for blurred vision, double vision and photophobia.  Respiratory: Positive for shortness of breath. Negative for cough.   Cardiovascular: Negative.  Negative for chest pain, palpitations and leg swelling.  Gastrointestinal: Negative.  Negative for abdominal pain, constipation, diarrhea, heartburn, nausea and vomiting.  Genitourinary:       DUB with menorrhagia  Musculoskeletal: Negative.  Negative for myalgias.  Neurological: Negative.  Negative for dizziness, focal weakness, seizures and headaches.  Endo/Heme/Allergies: Negative for environmental allergies.  Psychiatric/Behavioral: Negative.  Negative for suicidal ideas.    Past Medical History:  Diagnosis Date  . Abscess    facial  . Anemia   . Headache   . History of blood transfusion   . Hypertension    PMH  . Insomnia     Past Surgical History:  Procedure Laterality Date  . CESAREAN SECTION     C/S x 1  . DILATION AND CURETTAGE OF UTERUS    . TONSILLECTOMY      Family History  Problem Relation Age of Onset  . Hypertension Mother   . Anemia Sister   .  Anemia Brother   . Anemia Sister   . Anemia Other   . Hypertension Other     Social History Reviewed with no changes to be made today.   Outpatient Medications Prior to Visit  Medication Sig Dispense Refill  . ferrous sulfate 325 (65 FE) MG  tablet Take 1 tablet (325 mg total) 3 (three) times daily with meals by mouth. 90 tablet 3  . megestrol (MEGACE) 40 MG tablet Take 1 tablet (40 mg total) 2 (two) times daily by mouth. Take 80 mg BID x 3 days, then 40 mg BID until bleeding stops and/or seen by MD. 60 tablet 0  . megestrol (MEGACE) 40 MG tablet Take 1 tablet (40 mg total) by mouth 2 (two) times daily. 60 tablet 5  . hydrochlorothiazide (HYDRODIURIL) 25 MG tablet Take 1 tablet (25 mg total) by mouth daily. 30 tablet 2   No facility-administered medications prior to visit.     Allergies  Allergen Reactions  . Naprosyn [Naproxen] Nausea And Vomiting       Objective:    BP 126/85 (BP Location: Left Arm, Patient Position: Sitting, Cuff Size: Large)   Pulse 86   Temp 98.5 F (36.9 C) (Oral)   Ht 5\' 5"  (1.651 m)   Wt 254 lb 3.2 oz (115.3 kg)   SpO2 98%   BMI 42.30 kg/m  Wt Readings from Last 3 Encounters:  02/17/17 254 lb 3.2 oz (115.3 kg)  02/13/17 248 lb 14.4 oz (112.9 kg)  01/19/17 249 lb 0.6 oz (113 kg)    Physical Exam  Constitutional: She is oriented to person, place, and time. She appears well-developed and well-nourished. She is cooperative.  HENT:  Head: Normocephalic and atraumatic.  Eyes: EOM are normal.  Neck: Normal range of motion.  Cardiovascular: Normal rate, regular rhythm, normal heart sounds and intact distal pulses. Exam reveals no gallop and no friction rub.  No murmur heard. Pulmonary/Chest: Effort normal and breath sounds normal. No tachypnea. No respiratory distress. She has no decreased breath sounds. She has no wheezes. She has no rhonchi. She has no rales. She exhibits no tenderness.  Abdominal: Soft. Bowel sounds are normal.  Musculoskeletal: Normal range of motion. She exhibits no edema.  Neurological: She is alert and oriented to person, place, and time. Coordination normal.  Skin: Skin is warm and dry.  Psychiatric: She has a normal mood and affect. Her behavior is normal. Judgment  and thought content normal.  Nursing note and vitals reviewed.        Patient has been counseled extensively about nutrition and exercise as well as the importance of adherence with medications and regular follow-up. The patient was given clear instructions to go to ER or return to medical center if symptoms don't improve, worsen or new problems develop. The patient verbalized understanding.   Follow-up: Return in about 4 months (around 06/18/2017) for HTN.   Claiborne RiggZelda W Lennex Pietila, FNP-BC [facility], [place] [phone]   02/17/2017, 11:28 AM

## 2017-02-27 NOTE — Telephone Encounter (Signed)
I called Deanna ArtisKeisha back and she states she called [facility] about getting the free pap smear and they don't have a free clinic right now. She wanted to know if she had to get pap before she could have surgery. I informed her we would like her to but if she couldn't I didn't think it would hold up her surgery. She asked me to let Dr. Marice Potterove know and I informed her Dr.Dove is not in our office and I will forward the message.

## 2017-03-04 ENCOUNTER — Encounter (HOSPITAL_BASED_OUTPATIENT_CLINIC_OR_DEPARTMENT_OTHER): Payer: Self-pay | Admitting: *Deleted

## 2017-03-06 ENCOUNTER — Encounter (HOSPITAL_BASED_OUTPATIENT_CLINIC_OR_DEPARTMENT_OTHER)
Admission: RE | Admit: 2017-03-06 | Discharge: 2017-03-06 | Disposition: A | Discharge status: Home / Self Care | Payer: Self-pay | Source: Ambulatory Visit | Attending: Obstetrics & Gynecology | Admitting: Obstetrics & Gynecology

## 2017-03-06 DIAGNOSIS — N939 Abnormal uterine and vaginal bleeding, unspecified: Secondary | ICD-10-CM | POA: Insufficient documentation

## 2017-03-06 LAB — BASIC METABOLIC PANEL
Anion gap: 11 (ref 5–15)
BUN: 11 mg/dL (ref 6–20)
CHLORIDE: 104 mmol/L (ref 101–111)
CO2: 22 mmol/L (ref 22–32)
CREATININE: 0.64 mg/dL (ref 0.44–1.00)
Calcium: 9 mg/dL (ref 8.9–10.3)
Glucose, Bld: 108 mg/dL — ABNORMAL HIGH (ref 65–99)
Potassium: 3 mmol/L — ABNORMAL LOW (ref 3.5–5.1)
SODIUM: 137 mmol/L (ref 135–145)

## 2017-03-06 LAB — CBC
HCT: 43.3 % (ref 36.0–46.0)
Hemoglobin: 14.2 g/dL (ref 12.0–15.0)
MCH: 28.1 pg (ref 26.0–34.0)
MCHC: 32.8 g/dL (ref 30.0–36.0)
MCV: 85.6 fL (ref 78.0–100.0)
PLATELETS: 241 10*3/uL (ref 150–400)
RBC: 5.06 MIL/uL (ref 3.87–5.11)
RDW: 26 % — ABNORMAL HIGH (ref 11.5–15.5)
WBC: 11.7 10*3/uL — AB (ref 4.0–10.5)

## 2017-03-06 LAB — POCT PREGNANCY, URINE: PREG TEST UR: NEGATIVE

## 2017-03-06 NOTE — Progress Notes (Signed)
Lab values abnormal. Urine Pregnancy negative.  Dr Marice Potterove called about abnormal lab values. Instructed to call in KDur 20 meq BID up till day of surgery 12/26. Will complete I-stat morning of surgery to recheck potassium level.  Pt request [address] [facility] and verbalized understanding to pick up RX and start.

## 2017-03-11 ENCOUNTER — Ambulatory Visit (HOSPITAL_BASED_OUTPATIENT_CLINIC_OR_DEPARTMENT_OTHER): Payer: Self-pay | Admitting: Anesthesiology

## 2017-03-11 ENCOUNTER — Ambulatory Visit (HOSPITAL_COMMUNITY)
Admission: RE | Admit: 2017-03-11 | Discharge: 2017-03-11 | Disposition: A | Discharge status: Home / Self Care | Payer: Self-pay | Source: Ambulatory Visit | Attending: Obstetrics & Gynecology | Admitting: Obstetrics & Gynecology

## 2017-03-11 ENCOUNTER — Encounter (HOSPITAL_BASED_OUTPATIENT_CLINIC_OR_DEPARTMENT_OTHER): Payer: Self-pay | Admitting: Certified Registered"

## 2017-03-11 ENCOUNTER — Encounter (HOSPITAL_BASED_OUTPATIENT_CLINIC_OR_DEPARTMENT_OTHER)
Admission: RE | Disposition: A | Discharge status: Home / Self Care | Payer: Self-pay | Source: Ambulatory Visit | Attending: Obstetrics & Gynecology

## 2017-03-11 DIAGNOSIS — N840 Polyp of corpus uteri: Secondary | ICD-10-CM | POA: Insufficient documentation

## 2017-03-11 DIAGNOSIS — I10 Essential (primary) hypertension: Secondary | ICD-10-CM | POA: Insufficient documentation

## 2017-03-11 DIAGNOSIS — N938 Other specified abnormal uterine and vaginal bleeding: Secondary | ICD-10-CM | POA: Insufficient documentation

## 2017-03-11 DIAGNOSIS — Z79899 Other long term (current) drug therapy: Secondary | ICD-10-CM | POA: Insufficient documentation

## 2017-03-11 DIAGNOSIS — F17210 Nicotine dependence, cigarettes, uncomplicated: Secondary | ICD-10-CM | POA: Insufficient documentation

## 2017-03-11 HISTORY — PX: DILATATION & CURETTAGE/HYSTEROSCOPY WITH MYOSURE: SHX6511

## 2017-03-11 LAB — POCT I-STAT, CHEM 8
BUN: 9 mg/dL (ref 6–20)
CALCIUM ION: 1.15 mmol/L (ref 1.15–1.40)
CHLORIDE: 105 mmol/L (ref 101–111)
CREATININE: 0.6 mg/dL (ref 0.44–1.00)
GLUCOSE: 98 mg/dL (ref 65–99)
HCT: 47 % — ABNORMAL HIGH (ref 36.0–46.0)
Hemoglobin: 16 g/dL — ABNORMAL HIGH (ref 12.0–15.0)
Potassium: 3.3 mmol/L — ABNORMAL LOW (ref 3.5–5.1)
Sodium: 145 mmol/L (ref 135–145)
TCO2: 24 mmol/L (ref 22–32)

## 2017-03-11 SURGERY — DILATATION & CURETTAGE/HYSTEROSCOPY WITH MYOSURE
Anesthesia: General | Site: Vagina

## 2017-03-11 MED ORDER — PROPOFOL 10 MG/ML IV BOLUS
INTRAVENOUS | Status: AC
  Filled 2017-03-11: qty 20

## 2017-03-11 MED ORDER — DEXAMETHASONE SODIUM PHOSPHATE 10 MG/ML IJ SOLN
INTRAMUSCULAR | Status: AC
  Filled 2017-03-11: qty 1

## 2017-03-11 MED ORDER — BUPIVACAINE HCL (PF) 0.5 % IJ SOLN
INTRAMUSCULAR | Status: AC
  Filled 2017-03-11: qty 30

## 2017-03-11 MED ORDER — FENTANYL CITRATE (PF) 100 MCG/2ML IJ SOLN
INTRAMUSCULAR | Status: AC
  Filled 2017-03-11: qty 2

## 2017-03-11 MED ORDER — ONDANSETRON HCL 4 MG/2ML IJ SOLN
INTRAMUSCULAR | Status: DC | PRN
  Administered 2017-03-11: 4 mg via INTRAVENOUS

## 2017-03-11 MED ORDER — IBUPROFEN 600 MG PO TABS: 600.0000 mg | ORAL_TABLET | Freq: Four times a day (QID) | ORAL | 1 refills | Status: DC | PRN

## 2017-03-11 MED ORDER — BUPIVACAINE HCL (PF) 0.5 % IJ SOLN
INTRAMUSCULAR | Status: DC | PRN
  Administered 2017-03-11: 8 mL

## 2017-03-11 MED ORDER — ACETAMINOPHEN 500 MG PO TABS
ORAL_TABLET | ORAL | Status: AC
  Filled 2017-03-11: qty 2

## 2017-03-11 MED ORDER — PROPOFOL 10 MG/ML IV BOLUS
INTRAVENOUS | Status: DC | PRN
  Administered 2017-03-11: 200 mg via INTRAVENOUS

## 2017-03-11 MED ORDER — ACETAMINOPHEN 500 MG PO TABS
1000.0000 mg | ORAL_TABLET | Freq: Once | ORAL | Status: AC
  Administered 2017-03-11: 1000 mg via ORAL

## 2017-03-11 MED ORDER — SCOPOLAMINE 1 MG/3DAYS TD PT72: 1.0000 | MEDICATED_PATCH | Freq: Once | TRANSDERMAL | Status: DC | PRN

## 2017-03-11 MED ORDER — DEXAMETHASONE SODIUM PHOSPHATE 4 MG/ML IJ SOLN
INTRAMUSCULAR | Status: DC | PRN
  Administered 2017-03-11: 10 mg via INTRAVENOUS

## 2017-03-11 MED ORDER — MIDAZOLAM HCL 2 MG/2ML IJ SOLN
1.0000 mg | INTRAMUSCULAR | Status: DC | PRN
  Administered 2017-03-11: 1 mg via INTRAVENOUS

## 2017-03-11 MED ORDER — PROMETHAZINE HCL 25 MG/ML IJ SOLN: 6.2500 mg | INTRAMUSCULAR | Status: DC | PRN

## 2017-03-11 MED ORDER — LACTATED RINGERS IV SOLN
INTRAVENOUS | Status: DC
  Administered 2017-03-11 (×2): via INTRAVENOUS

## 2017-03-11 MED ORDER — LIDOCAINE 2% (20 MG/ML) 5 ML SYRINGE
INTRAMUSCULAR | Status: AC
  Filled 2017-03-11: qty 5

## 2017-03-11 MED ORDER — KETOROLAC TROMETHAMINE 30 MG/ML IJ SOLN
INTRAMUSCULAR | Status: DC | PRN
  Administered 2017-03-11: 30 mg via INTRAVENOUS

## 2017-03-11 MED ORDER — KETOROLAC TROMETHAMINE 30 MG/ML IJ SOLN
INTRAMUSCULAR | Status: AC
  Filled 2017-03-11: qty 1

## 2017-03-11 MED ORDER — FENTANYL CITRATE (PF) 100 MCG/2ML IJ SOLN
50.0000 ug | INTRAMUSCULAR | Status: AC | PRN
  Administered 2017-03-11 (×2): 50 ug via INTRAVENOUS
  Administered 2017-03-11 (×2): 25 ug via INTRAVENOUS
  Administered 2017-03-11: 50 ug via INTRAVENOUS

## 2017-03-11 MED ORDER — LIDOCAINE HCL (CARDIAC) 20 MG/ML IV SOLN
INTRAVENOUS | Status: DC | PRN
  Administered 2017-03-11: 80 mg via INTRAVENOUS

## 2017-03-11 MED ORDER — SODIUM CHLORIDE 0.9 % IR SOLN
Status: DC | PRN
  Administered 2017-03-11: 1

## 2017-03-11 MED ORDER — FENTANYL CITRATE (PF) 100 MCG/2ML IJ SOLN: 25.0000 ug | INTRAMUSCULAR | Status: DC | PRN

## 2017-03-11 MED ORDER — SUCCINYLCHOLINE CHLORIDE 20 MG/ML IJ SOLN
INTRAMUSCULAR | Status: DC | PRN
  Administered 2017-03-11: 100 mg via INTRAVENOUS

## 2017-03-11 MED ORDER — ONDANSETRON HCL 4 MG/2ML IJ SOLN
INTRAMUSCULAR | Status: AC
  Filled 2017-03-11: qty 2

## 2017-03-11 MED ORDER — MIDAZOLAM HCL 2 MG/2ML IJ SOLN
INTRAMUSCULAR | Status: AC
  Filled 2017-03-11: qty 2

## 2017-03-11 SURGICAL SUPPLY — 29 items
BIPOLAR CUTTING LOOP 21FR (ELECTRODE)
BRIEF STRETCH FOR OB PAD XXL (UNDERPADS AND DIAPERS) ×4 IMPLANT
CANISTER SUCT 3000ML PPV (MISCELLANEOUS) ×4 IMPLANT
CATH ROBINSON RED A/P 16FR (CATHETERS) IMPLANT
CLOTH BEACON ORANGE TIMEOUT ST (SAFETY) ×4 IMPLANT
CONTAINER PREFILL 10% NBF 60ML (FORM) ×8 IMPLANT
DEVICE MYOSURE LITE (MISCELLANEOUS) IMPLANT
DEVICE MYOSURE REACH (MISCELLANEOUS) ×4 IMPLANT
DILATOR CANAL MILEX (MISCELLANEOUS) IMPLANT
ELECT REM PT RETURN 9FT ADLT (ELECTROSURGICAL)
ELECTRODE REM PT RTRN 9FT ADLT (ELECTROSURGICAL) IMPLANT
FILTER ARTHROSCOPY CONVERTOR (FILTER) ×4 IMPLANT
GLOVE BIO SURGEON STRL SZ 6.5 (GLOVE) ×3 IMPLANT
GLOVE BIO SURGEONS STRL SZ 6.5 (GLOVE) ×1
GLOVE BIOGEL PI IND STRL 7.0 (GLOVE) ×2 IMPLANT
GLOVE BIOGEL PI INDICATOR 7.0 (GLOVE) ×2
GOWN STRL REUS W/ TWL XL LVL3 (GOWN DISPOSABLE) ×2 IMPLANT
GOWN STRL REUS W/TWL LRG LVL3 (GOWN DISPOSABLE) ×4 IMPLANT
GOWN STRL REUS W/TWL XL LVL3 (GOWN DISPOSABLE) ×2
LOOP CUTTING BIPOLAR 21FR (ELECTRODE) IMPLANT
NEEDLE SPNL 18GX3.5 QUINCKE PK (NEEDLE) ×4 IMPLANT
PACK VAGINAL MINOR WOMEN LF (CUSTOM PROCEDURE TRAY) ×4 IMPLANT
PAD OB MATERNITY 4.3X12.25 (PERSONAL CARE ITEMS) ×4 IMPLANT
SEAL ROD LENS SCOPE MYOSURE (ABLATOR) ×4 IMPLANT
SLEEVE SCD COMPRESS KNEE MED (MISCELLANEOUS) ×4 IMPLANT
SYR 30ML LL (SYRINGE) ×4 IMPLANT
TOWEL OR 17X24 6PK STRL BLUE (TOWEL DISPOSABLE) ×8 IMPLANT
TUBING AQUILEX INFLOW (TUBING) ×4 IMPLANT
TUBING AQUILEX OUTFLOW (TUBING) ×4 IMPLANT

## 2017-03-11 NOTE — Discharge Instructions (Signed)
°Post Anesthesia Home Care Instructions ° °Activity: °Get plenty of rest for the remainder of the day. A responsible individual must stay with you for 24 hours following the procedure.  °For the next 24 hours, DO NOT: °-Drive a car °-Operate machinery °-Drink alcoholic beverages °-Take any medication unless instructed by your physician °-Make any legal decisions or sign important papers. ° °Meals: °Start with liquid foods such as gelatin or soup. Progress to regular foods as tolerated. Avoid greasy, spicy, heavy foods. If nausea and/or vomiting occur, drink only clear liquids until the nausea and/or vomiting subsides. Call your physician if vomiting continues. ° °Special Instructions/Symptoms: °Your throat may feel dry or sore from the anesthesia or the breathing tube placed in your throat during surgery. If this causes discomfort, gargle with warm salt water. The discomfort should disappear within 24 hours. ° °If you had a scopolamine patch placed behind your ear for the management of post- operative nausea and/or vomiting: ° °1. The medication in the patch is effective for 72 hours, after which it should be removed.  Wrap patch in a tissue and discard in the trash. Wash hands thoroughly with soap and water. °2. You may remove the patch earlier than 72 hours if you experience unpleasant side effects which may include dry mouth, dizziness or visual disturbances. °3. Avoid touching the patch. Wash your hands with soap and water after contact with the patch. °  °Dilation and Curettage or Vacuum Curettage, Care After °This sheet gives you information about how to care for yourself after your procedure. Your health care provider may also give you more specific instructions. If you have problems or questions, contact your health care provider. °What can I expect after the procedure? °After your procedure, it is common to have: °· Mild pain or cramping. °· Some vaginal bleeding or spotting. ° °These may last for up to 2  weeks after your procedure. °Follow these instructions at home: °Activity ° °· Do not drive or use heavy machinery while taking prescription pain medicine. °· Avoid driving for the first 24 hours after your procedure. °· Take frequent, short walks, followed by rest periods, throughout the day. Ask your health care provider what activities are safe for you. After 1-2 days, you may be able to return to your normal activities. °· Do not lift anything heavier than 10 lb (4.5 kg) until your health care provider approves. °· For at least 2 weeks, or as long as told by your health care provider, do not: °? Douche. °? Use tampons. °? Have sexual intercourse. °General instructions ° °· Take over-the-counter and prescription medicines only as told by your health care provider. This is especially important if you take blood thinning medicine. °· Do not take baths, swim, or use a hot tub until your health care provider approves. Take showers instead of baths. °· Wear compression stockings as told by your health care provider. These stockings help to prevent blood clots and reduce swelling in your legs. °· It is your responsibility to get the results of your procedure. Ask your health care provider, or the department performing the procedure, when your results will be ready. °· Keep all follow-up visits as told by your health care provider. This is important. °Contact a health care provider if: °· You have severe cramps that get worse or that do not get better with medicine. °· You have severe abdominal pain. °· You cannot drink fluids without vomiting. °· You develop pain in a different area of your   pelvis. °· You have bad-smelling vaginal discharge. °· You have a rash. °Get help right away if: °· You have vaginal bleeding that soaks more than one sanitary pad in 1 hour, for 2 hours in a row. °· You pass large blood clots from your vagina. °· You have a fever that is above 100.4°F (38.0°C). °· Your abdomen feels very tender or  hard. °· You have chest pain. °· You have shortness of breath. °· You cough up blood. °· You feel dizzy or light-headed. °· You faint. °· You have pain in your neck or shoulder area. °This information is not intended to replace advice given to you by your health care provider. Make sure you discuss any questions you have with your health care provider. °Document Released: 02/29/2000 Document Revised: 10/31/2015 Document Reviewed: 10/04/2015 °Elsevier Interactive Patient Education © 2018 [employer] ° °

## 2017-03-11 NOTE — Anesthesia Preprocedure Evaluation (Addendum)
Anesthesia Evaluation  Patient identified by MRN, date of birth, ID band Patient awake    Reviewed: Allergy & Precautions, NPO status , Patient's Chart, lab work & pertinent test results  Airway Mallampati: II  TM Distance: >3 FB Neck ROM: Full    Dental  (+) Teeth Intact, Dental Advisory Given   Pulmonary Current Smoker,    Pulmonary exam normal breath sounds clear to auscultation       Cardiovascular hypertension, Pt. on medications Normal cardiovascular exam Rhythm:Regular Rate:Normal     Neuro/Psych  Headaches, negative psych ROS   GI/Hepatic negative GI ROS, Neg liver ROS,   Endo/Other  Morbid obesityHypokalemia   Renal/GU negative Renal ROS     Musculoskeletal negative musculoskeletal ROS (+)   Abdominal   Peds  Hematology negative hematology ROS (+)   Anesthesia Other Findings Day of surgery medications reviewed with the patient.  Reproductive/Obstetrics DUB                            Anesthesia Physical Anesthesia Plan  ASA: III  Anesthesia Plan: General   Post-op Pain Management:    Induction: Intravenous  PONV Risk Score and Plan: 3 and Dexamethasone, Ondansetron and Midazolam  Airway Management Planned: LMA  Additional Equipment:   Intra-op Plan:   Post-operative Plan: Extubation in OR  Informed Consent: I have reviewed the patients History and Physical, chart, labs and discussed the procedure including the risks, benefits and alternatives for the proposed anesthesia with the patient or authorized representative who has indicated his/her understanding and acceptance.   Dental advisory given  Plan Discussed with: CRNA, Anesthesiologist and Surgeon  Anesthesia Plan Comments: (Risks/benefits of general anesthesia discussed with patient including risk of damage to teeth, lips, gum, and tongue, nausea/vomiting, allergic reactions to medications, and the possibility  of heart attack, stroke and death.  All patient questions answered.  Patient wishes to proceed.)        Anesthesia Quick Evaluation

## 2017-03-11 NOTE — Transfer of Care (Signed)
Immediate Anesthesia Transfer of Care Note  Patient: Meagan RaiderKeshia Augustine  Procedure(s) Performed: DILATATION AND CURETTAGE /HYSTEROSCOPY (N/A ) POSSIBLE DILATATION & CURETTAGE/HYSTEROSCOPY WITH MYOSURE (N/A )  Patient Location: PACU  Anesthesia Type:General  Level of Consciousness: awake, alert  and oriented  Airway & Oxygen Therapy: Patient Spontanous Breathing and Patient connected to face mask oxygen  Post-op Assessment: Report given to RN and Post -op Vital signs reviewed and stable  Post vital signs: Reviewed and stable  Last Vitals:  Vitals:   03/11/17 1509 03/11/17 1510  BP: (!) 135/96   Pulse:  88  Resp:  19  Temp:    SpO2:  100%    Last Pain:  Vitals:   03/11/17 1403  TempSrc: Oral  PainSc: 0-No pain         Complications: No apparent anesthesia complications

## 2017-03-11 NOTE — Op Note (Signed)
03/11/2017  3:02 PM  PATIENT:  Meagan RaiderKeshia Vandekamp  41 y.o. female  PRE-OPERATIVE DIAGNOSIS:  Dysfunctional uterine bleeding  POST-OPERATIVE DIAGNOSIS:  Dysfunctional uterine bleeding  PROCEDURE:  Dilation and curettage, hysteroscopy, myosure removal of polyps  SURGEON:  Surgeon(s) and Role:    * Maddison Kilner C, MD - Primary  ANESTHESIA:   general and paracervical block  EBL:  30 mL   BLOOD ADMINISTERED:none  DRAINS: none   LOCAL MEDICATIONS USED:  MARCAINE     SPECIMEN:  Source of Specimen:  uterine curettings, myosure sample  DISPOSITION OF SPECIMEN:  PATHOLOGY  COUNTS:  YES  TOURNIQUET:  * No tourniquets in log *  DICTATION: .Dragon Dictation  PLAN OF CARE: Discharge to home after PACU  PATIENT DISPOSITION:  PACU - hemodynamically stable.   Delay start of Pharmacological VTE agent (>24hrs) due to surgical blood loss or risk of bleeding: not applicable    The risks, benefits, and alternatives of surgery were explained, understood, and accepted. All questions were answered. Consents were signed. In the operating room general anesthesia was applied without complication, and she was placed in the dorsal lithotomy position. Her vagina was prepped and draped in the usual sterile fashion. A bimanual exam revealed a normal size and shape anteverted mobile uterus. Her adnexa were nonenlarged. A speculum was placed and a single-tooth tenaculum was used to grasp the anterior lip of her cervix. A total of 20 mL of 0.5% Marcaine was used to perform a paracervical block. Her uterus sounded to 11cm. Her cervix was carefully and slowly dilated to accommodate a hysteroscope. A moderate amount of polypoid tissue was noted. I used the Myosure device to remove the polypoid tissue. Hemostasis was noted.  A curettage was done in all quadrants and the fundus of the uterus. A gritty sensation was appreciated throughout. There was no bleeding noted at the end of the case. She was taken to the recovery  room after being extubated. She tolerated the procedure well.

## 2017-03-11 NOTE — Anesthesia Postprocedure Evaluation (Signed)
Anesthesia Post Note  Patient: Lupita RaiderKeshia Prokop  Procedure(s) Performed: DILATATION & CURETTAGE/HYSTEROSCOPY WITH MYOSURE (N/A Vagina )     Patient location during evaluation: PACU Anesthesia Type: General Level of consciousness: awake and alert Pain management: pain level controlled Vital Signs Assessment: post-procedure vital signs reviewed and stable Respiratory status: spontaneous breathing, nonlabored ventilation, respiratory function stable and patient connected to nasal cannula oxygen Cardiovascular status: blood pressure returned to baseline and stable Postop Assessment: no apparent nausea or vomiting Anesthetic complications: no    Last Vitals:  Vitals:   03/11/17 1530 03/11/17 1538  BP: (!) 152/98 (!) 152/97  Pulse: 87 82  Resp: 16 18  Temp:  36.7 C  SpO2: 100% 100%    Last Pain:  Vitals:   03/11/17 1538  TempSrc: Oral  PainSc: 0-No pain                 Maggy Wyble,JAMES TERRILL

## 2017-03-11 NOTE — H&P (Signed)
Meagan Nelson is a 41 y.o. female. P7 with 6 living children here for a d&c, removal of uterine polyps. She was seen there for heavy bleeding. Periods now lasting up to 2 weeks. Her hbg was 6 and her u/s showed polyps. She does not feel like she needs a transfusion at this time. [facility] when her hbg got to 3. She has been taking iron TID and megace daily. She has had no more bleeding since 10/18. Her hbg is now 16.   Patient's last menstrual period was 01/01/2017 (approximate).    Past Medical History:  Diagnosis Date  . Abscess    facial  . Anemia   . Headache   . History of blood transfusion   . Hypertension    PMH  . Insomnia     Past Surgical History:  Procedure Laterality Date  . CESAREAN SECTION     C/S x 1  . DILATION AND CURETTAGE OF UTERUS    . TONSILLECTOMY      Family History  Problem Relation Age of Onset  . Hypertension Mother   . Anemia Sister   . Anemia Brother   . Anemia Sister   . Anemia Other   . Hypertension Other     Social History:  reports that she has been smoking cigarettes.  She has been smoking about 0.10 packs per day. she has never used smokeless tobacco. She reports that she drinks alcohol. She reports that she does not use drugs.  She has unprotected intercourse.  Allergies:  Allergies  Allergen Reactions  . Naprosyn [Naproxen] Nausea And Vomiting    Medications Prior to Admission  Medication Sig Dispense Refill Last Dose  . ferrous sulfate 325 (65 FE) MG tablet Take 1 tablet (325 mg total) 3 (three) times daily with meals by mouth. 90 tablet 3 03/11/2017 at Unknown time  . hydrochlorothiazide (HYDRODIURIL) 25 MG tablet Take 1 tablet (25 mg total) by mouth daily. 90 tablet 1 03/11/2017 at Unknown time  . megestrol (MEGACE) 40 MG tablet Take 1 tablet (40 mg total) 2 (two) times daily by mouth. Take 80 mg BID x 3 days, then 40 mg BID until bleeding stops and/or seen by MD. 60 tablet 0 03/11/2017 at Unknown time  .  potassium chloride SA (K-DUR,KLOR-CON) 20 MEQ tablet Take 20 mEq by mouth 2 (two) times daily.   03/11/2017 at Unknown time    ROS  Height 5\' 5"  (1.651 m), weight 115.2 kg (254 lb), last menstrual period 01/01/2017. Physical Exam  Breathing, conversing, and ambulating normally Heart- rrr Lungs- CTAB Abd- benign  Results for orders placed or performed during the hospital encounter of 03/11/17 (from the past 24 hour(s))  I-STAT, chem 8     Status: Abnormal   Collection Time: 03/11/17  1:42 PM  Result Value Ref Range   Sodium 145 135 - 145 mmol/L   Potassium 3.3 (L) 3.5 - 5.1 mmol/L   Chloride 105 101 - 111 mmol/L   BUN 9 6 - 20 mg/dL   Creatinine, Ser 0.450.60 0.44 - 1.00 mg/dL   Glucose, Bld 98 65 - 99 mg/dL   Calcium, Ion 4.091.15 8.111.15 - 1.40 mmol/L   TCO2 24 22 - 32 mmol/L   Hemoglobin 16.0 (H) 12.0 - 15.0 g/dL   HCT 91.447.0 (H) 78.236.0 - 95.646.0 %    No results found.  Assessment/Plan: DUB, polyps- plan for d&c, possible myosure  She understands the risks of surgery, including,  but not to infection, bleeding, DVTs, damage to bowel, bladder, ureters. She wishes to proceed.     Meagan Nelson 03/11/2017, 1:57 PM

## 2017-03-11 NOTE — Anesthesia Procedure Notes (Signed)
Procedure Name: Intubation Performed by: Verita Lamb, CRNA Pre-anesthesia Checklist: Patient identified, Emergency Drugs available, Suction available, Patient being monitored and Timeout performed Oxygen Delivery Method: Circle system utilized Preoxygenation: Pre-oxygenation with 100% oxygen Induction Type: IV induction Ventilation: Mask ventilation without difficulty and Oral airway inserted - appropriate to patient size Laryngoscope Size: Mac and 3 Grade View: Grade II Tube type: Oral Number of attempts: 1 Airway Equipment and Method: Stylet Placement Confirmation: ETT inserted through vocal cords under direct vision,  positive ETCO2,  CO2 detector and breath sounds checked- equal and bilateral Secured at: 22 cm Tube secured with: Tape Dental Injury: Teeth and Oropharynx as per pre-operative assessment

## 2017-03-12 ENCOUNTER — Encounter (HOSPITAL_BASED_OUTPATIENT_CLINIC_OR_DEPARTMENT_OTHER): Payer: Self-pay | Admitting: Obstetrics & Gynecology

## 2017-04-10 ENCOUNTER — Telehealth: Payer: Self-pay | Admitting: General Practice

## 2017-04-10 NOTE — Telephone Encounter (Signed)
Patient called and left message on nurse line stating she had a d&c by Dr Marice Potterove 12/26 and she is still bleeding. Patient states she hasn't stopped bleeding. Called patient and asked if she was having heavy or light bleeding and asked if she had been feeling dizzy or lightheaded. Patient states the bleeding can be heavy at times but some days it's light. Patient states she is feeling okay but did start back taking her iron. Offered urgent appt slot on Monday 1/28 @ 2:35pm to patient. Patient verbalized understanding and states she will be here. Patient understands appt is with Dr Debroah LoopArnold not Dr Marice Potterove. Reminded patient to go to [facility] over the weekend if something changes and she feels worse. Patient verbalized understanding & had no questions

## 2017-04-13 ENCOUNTER — Encounter: Payer: Self-pay | Admitting: Obstetrics & Gynecology

## 2017-04-13 ENCOUNTER — Ambulatory Visit (INDEPENDENT_AMBULATORY_CARE_PROVIDER_SITE_OTHER): Payer: Self-pay | Admitting: Obstetrics & Gynecology

## 2017-04-13 VITALS — BP 135/98 | HR 108 | Wt 251.1 lb

## 2017-04-13 DIAGNOSIS — N939 Abnormal uterine and vaginal bleeding, unspecified: Secondary | ICD-10-CM

## 2017-04-13 MED ORDER — NORETHINDRONE-ETH ESTRADIOL 1-35 MG-MCG PO TABS: 1.0000 | ORAL_TABLET | Freq: Every day | ORAL | 11 refills | Status: DC

## 2017-04-13 NOTE — Progress Notes (Signed)
Subjective:Daily bleeding since D&C     Meagan Nelson is a 42 y.o. female who presents to the clinic 5 weeks status post operative hysteroscopy for abnormal uterine bleeding. Eating a regular diet without difficulty. Bowel movements are normal. Pain is controlled without any medications.  The following portions of the patient's history were reviewed and updated as appropriate: allergies, current medications, past family history, past medical history, past social history, past surgical history and problem list.  Review of Systems Pertinent items are noted in HPI.    Objective:    BP (!) 135/98   Pulse (!) 108   Wt 251 lb 1.6 oz (113.9 kg)   BMI 41.79 kg/m  General:  alert, cooperative and no distress           Assessment:    Postoperative course complicated by continued daily DUb up to 10 pads a day Operative findings again reviewed. Pathology report discussed.    Plan:    1. Continue any current medications. 2. Wound care discussed. 3. Activity restrictions: none 4. Anticipated return to work: now. 5. Follow up: 1 week to see Dr Marice Potterove, will taper OCP, ON 1/35 Rx given  Adam PhenixArnold, James G, MD 04/13/2017

## 2017-04-13 NOTE — Patient Instructions (Signed)

## 2017-07-30 ENCOUNTER — Ambulatory Visit: Payer: Self-pay | Admitting: Obstetrics & Gynecology

## 2017-12-05 ENCOUNTER — Other Ambulatory Visit: Payer: Self-pay | Admitting: Obstetrics & Gynecology

## 2017-12-23 ENCOUNTER — Encounter (HOSPITAL_COMMUNITY): Payer: Self-pay | Admitting: Emergency Medicine

## 2017-12-23 ENCOUNTER — Emergency Department (HOSPITAL_COMMUNITY): Payer: Self-pay

## 2017-12-23 ENCOUNTER — Emergency Department (HOSPITAL_COMMUNITY)
Admission: EM | Admit: 2017-12-23 | Discharge: 2017-12-23 | Disposition: A | Discharge status: Home / Self Care | Payer: Self-pay | Attending: Emergency Medicine | Admitting: Emergency Medicine

## 2017-12-23 DIAGNOSIS — I10 Essential (primary) hypertension: Secondary | ICD-10-CM

## 2017-12-23 DIAGNOSIS — F17210 Nicotine dependence, cigarettes, uncomplicated: Secondary | ICD-10-CM | POA: Insufficient documentation

## 2017-12-23 DIAGNOSIS — Z79899 Other long term (current) drug therapy: Secondary | ICD-10-CM | POA: Insufficient documentation

## 2017-12-23 DIAGNOSIS — M25461 Effusion, right knee: Secondary | ICD-10-CM

## 2017-12-23 LAB — GRAM STAIN

## 2017-12-23 LAB — SYNOVIAL CELL COUNT + DIFF, W/ CRYSTALS
Crystals, Fluid: NONE SEEN
Eosinophils-Synovial: 0 % (ref 0–1)
Lymphocytes-Synovial Fld: 5 % (ref 0–20)
Monocyte-Macrophage-Synovial Fluid: 61 % (ref 50–90)
Neutrophil, Synovial: 34 % — ABNORMAL HIGH (ref 0–25)
WBC, Synovial: 86 /mm3 (ref 0–200)

## 2017-12-23 MED ORDER — LIDOCAINE-EPINEPHRINE (PF) 2 %-1:200000 IJ SOLN
30.0000 mL | Freq: Once | INTRAMUSCULAR | Status: AC
  Administered 2017-12-23: 30 mL via INTRADERMAL
  Filled 2017-12-23: qty 40

## 2017-12-23 MED ORDER — HYDROCHLOROTHIAZIDE 25 MG PO TABS
25.0000 mg | ORAL_TABLET | Freq: Every day | ORAL | Status: DC
  Administered 2017-12-23: 25 mg via ORAL
  Filled 2017-12-23: qty 1

## 2017-12-23 MED ORDER — HYDROCHLOROTHIAZIDE 25 MG PO TABS: 25.0000 mg | ORAL_TABLET | Freq: Every day | ORAL | 5 refills | Status: DC

## 2017-12-23 NOTE — ED Provider Notes (Signed)
.  Joint Aspiration/Arthrocentesis Performed by: Azalia Bilis, MD Authorized by: Azalia Bilis, MD   Consent:    Consent obtained:  Verbal   Consent given by:  Patient   Risks discussed:  Bleeding, incomplete drainage and infection   Alternatives discussed:  No treatment Location:    Location:  Knee   Knee:  R knee Procedure details:    Preparation: Patient was prepped and draped in usual sterile fashion     Needle gauge:  18 G   Ultrasound guidance: no     Approach:  Lateral   Aspirate characteristics:  Yellow   Steroid injected: no     Specimen collected: yes   Post-procedure details:    Dressing:  Adhesive bandage   Patient tolerance of procedure:  Tolerated well, no immediate complications      Azalia Bilis, MD 12/23/17 1547

## 2017-12-23 NOTE — ED Notes (Signed)
Pt refused crutches, sts has own in car.

## 2017-12-23 NOTE — ED Triage Notes (Signed)
Pt reports R knee pain, swelling and warmth x2 weeks, no known injuries.

## 2017-12-23 NOTE — ED Provider Notes (Signed)
[name] EMERGENCY DEPARTMENT Provider Note   CSN: [id number] Arrival date & time: 12/23/17  0604     History   Chief Complaint Chief Complaint  Patient presents with  . Knee Pain    HPI Meagan Nelson is a 42 y.o. female with a pmhx of HTN who presented to the ED today complaining of R knee pain onset 2 weeks ago. Pt reports that about 1 year ago she sustained a fall landing on her R knee and had similar pain which resolved eventually resolved with RICE precautions. Her current pain started 2 weeks ago, spontaneously. No additional injury/fall. She feels that her knee is very swollen/hot to the touch. Pain is worsened with bearing weight and bending her knee. Extending her knee joint is not as painful. Denies any fevers, chills. She is sexually active with her husband only, does not feel at risk for gonorrhea/chlamydia. No hx of gout.   HPI  Past Medical History:  Diagnosis Date  . Abscess    facial  . Anemia   . Headache   . History of blood transfusion   . Hypertension    PMH  . Insomnia     Patient Active Problem List   Diagnosis Date Noted  . Essential hypertension 02/17/2017  . Abnormal uterine bleeding 01/19/2017  . Cellulitis of face   . Symptomatic anemia 09/01/2014  . Microcytic hypochromic anemia 09/01/2014  . Dental infection 09/01/2014    Past Surgical History:  Procedure Laterality Date  . CESAREAN SECTION     C/S x 1  . DILATATION & CURETTAGE/HYSTEROSCOPY WITH MYOSURE N/A 03/11/2017   Procedure: DILATATION & CURETTAGE/HYSTEROSCOPY WITH MYOSURE;  Surgeon: Allie Bossier, MD;  Location: [facility];  Service: Gynecology;  Laterality: N/A;  . DILATION AND CURETTAGE OF UTERUS    . TONSILLECTOMY       OB History    Gravida  7   Para  0   Term      Preterm      AB  1   Living  6     SAB  1   TAB      Ectopic      Multiple      Live Births  5            Home Medications    Prior to Admission  medications   Medication Sig Start Date End Date Taking? Authorizing Provider  ferrous sulfate 325 (65 FE) MG tablet Take 325 mg by mouth daily with breakfast.    [provider]  hydrochlorothiazide (HYDRODIURIL) 25 MG tablet Take 1 tablet (25 mg total) by mouth daily. 02/17/17 05/18/17  Claiborne Rigg, NP  ibuprofen (ADVIL,MOTRIN) 600 MG tablet Take 1 tablet (600 mg total) by mouth every 6 (six) hours as needed. 03/11/17   Allie Bossier, MD  megestrol (MEGACE) 40 MG tablet TAKE 1 TABLET BY MOUTH TWICE A DAY 12/07/17   Nicholaus Bloom C, MD  norethindrone-ethinyl estradiol 1/35 (ORTHO-NOVUM 1/35, 28,) tablet Take 1 tablet by mouth daily. 1 by mouth three times a day for 3 days then 2 a day for 3 days then one daily 04/13/17   Adam Phenix, MD  potassium chloride SA (K-DUR,KLOR-CON) 20 MEQ tablet Take 20 mEq by mouth 2 (two) times daily.    Pasty Arch, RN    Family History Family History  Problem Relation Age of Onset  . Hypertension Mother   . Anemia Sister   .  Anemia Brother   . Anemia Sister   . Anemia Other   . Hypertension Other     Social History Social History   Tobacco Use  . Smoking status: Current Some Day Smoker    Packs/day: 0.10    Types: Cigarettes  . Smokeless tobacco: Never Used  Substance Use Topics  . Alcohol use: Yes    Comment: ocassinally   . Drug use: No     Allergies   Naprosyn [naproxen]   Review of Systems Review of Systems  All other systems reviewed and are negative.    Physical Exam Updated Vital Signs BP (!) 163/122 (BP Location: Right Arm)   Pulse 100   Temp 98.3 F (36.8 C) (Oral)   Resp 16   SpO2 100%   Physical Exam  Constitutional: She is oriented to person, place, and time. She appears well-developed and well-nourished. No distress.  HENT:  Head: Normocephalic and atraumatic.  Eyes: Conjunctivae are normal. Right eye exhibits no discharge. Left eye exhibits no discharge. No scleral icterus.  Cardiovascular: Normal  rate.  Pulmonary/Chest: Effort normal.  Musculoskeletal:  Negative anterior/poster drawer bilaterally. Patella is palpable, nontender. There does appear to be swelling/fluid superior/lateral to patella.  No varus or valgus laxity. No crepitus. Mild pain with extension. No pain with extension. No calf swelling/tenderness. No overlying warmth/erythema to knee.   Neurological: She is alert and oriented to person, place, and time. Coordination normal.  Skin: Skin is warm and dry. No rash noted. She is not diaphoretic. No erythema. No pallor.  Psychiatric: She has a normal mood and affect. Her behavior is normal.  Nursing note and vitals reviewed.    ED Treatments / Results  Labs (all labs ordered are listed, but only abnormal results are displayed) Labs Reviewed - No data to display  EKG None  Radiology No results found.  Procedures Procedures (including critical care time)  Apiration of blood/fluid Performed by: Dub Mikes Consent obtained. Required items: required blood products, implants, devices, and special equipment available Patient identity confirmed: verbally with patient Time out: Immediately prior to procedure a "time out" was called to verify the correct patient, procedure, equipment, support staff and site/side marked as required. Preparation: Patient was prepped and draped in the usual sterile fashion. Patient tolerance: Patient tolerated the procedure well with no immediate complications.  Location of aspiration: R knee    Medications Ordered in ED Medications - No data to display   Initial Impression / Assessment and Plan / ED Course  I have reviewed the triage vital signs and the nursing notes.  Pertinent labs & imaging results that were available during my care of the patient were reviewed by me and considered in my medical decision making (see chart for details).     42 y.o obese F presents to the ED with 2 weeks of atraumatic R knee pain.  ON exam, knee does appear to be swollen with likely effusion. Will obtain Xray to evaluate. Possibly gouty flare vs inflammatory arthritis, although no prior history. No overlying warmth, erythema. Does not appear to be infected clinically. Less likely gonorrhea infection.  Xray confirms sizable knee effusion. Given that this is initial episode of atraumatic knee effusion, will proceed with joint aspiration to test fluid.   8:19 AM R knee joint aspirated successfully with 30ml yellow/straw colored fluid . Will send for studies and await results.  SYnovial fluid with no crystals, small amount WBC. Doubt infectious process. No evidence of gout. Most likely inflamatory  arthritis. Will place knee immobilizer and give OP ortho referral. She has crutches at home she would prefer to use.   Final Clinical Impressions(s) / ED Diagnoses   Final diagnoses:  None    ED Discharge Orders    None       Dub Mikes, PA-C 12/23/17 1344    Azalia Bilis, MD 12/23/17 1547

## 2017-12-23 NOTE — Discharge Instructions (Addendum)
Wear knee immobilizer when standing or walking. Take ibuprofen at home as needed for pain. Follow up with Your PCP and orthopedic provider. Return to the ED if your knee becomes red/ hot, or if you develop fever.

## 2017-12-24 LAB — GLUCOSE, BODY FLUID OTHER: Glucose, Body Fluid Other: 103 mg/dL

## 2017-12-24 LAB — PROTEIN, BODY FLUID (OTHER): Total Protein, Body Fluid Other: 4 g/dL

## 2017-12-24 LAB — URIC ACID, BODY FLUID: Uric Acid Body Fluid: 5.4 mg/dL

## 2017-12-29 LAB — CULTURE, BODY FLUID W GRAM STAIN -BOTTLE: Culture: NO GROWTH

## 2018-01-15 ENCOUNTER — Inpatient Hospital Stay: Payer: Self-pay | Admitting: Nurse Practitioner

## 2018-01-19 ENCOUNTER — Other Ambulatory Visit: Payer: Self-pay | Admitting: Obstetrics & Gynecology

## 2018-01-22 ENCOUNTER — Inpatient Hospital Stay (INDEPENDENT_AMBULATORY_CARE_PROVIDER_SITE_OTHER): Payer: Self-pay | Admitting: Physician Assistant

## 2018-01-29 ENCOUNTER — Encounter: Payer: Self-pay | Admitting: Nurse Practitioner

## 2018-01-29 ENCOUNTER — Ambulatory Visit: Payer: Self-pay | Attending: Nurse Practitioner | Admitting: Nurse Practitioner

## 2018-01-29 VITALS — BP 118/79 | HR 100 | Temp 98.7°F | Ht 65.0 in | Wt 259.0 lb

## 2018-01-29 DIAGNOSIS — Z79899 Other long term (current) drug therapy: Secondary | ICD-10-CM | POA: Insufficient documentation

## 2018-01-29 DIAGNOSIS — M25561 Pain in right knee: Secondary | ICD-10-CM | POA: Insufficient documentation

## 2018-01-29 DIAGNOSIS — N938 Other specified abnormal uterine and vaginal bleeding: Secondary | ICD-10-CM | POA: Insufficient documentation

## 2018-01-29 DIAGNOSIS — I10 Essential (primary) hypertension: Secondary | ICD-10-CM | POA: Insufficient documentation

## 2018-01-29 DIAGNOSIS — Z791 Long term (current) use of non-steroidal anti-inflammatories (NSAID): Secondary | ICD-10-CM | POA: Insufficient documentation

## 2018-01-29 DIAGNOSIS — Z886 Allergy status to analgesic agent status: Secondary | ICD-10-CM | POA: Insufficient documentation

## 2018-01-29 DIAGNOSIS — Z6841 Body Mass Index (BMI) 40.0 and over, adult: Secondary | ICD-10-CM | POA: Insufficient documentation

## 2018-01-29 MED ORDER — IBUPROFEN 800 MG PO TABS: 800.0000 mg | ORAL_TABLET | Freq: Four times a day (QID) | ORAL | 1 refills | Status: AC | PRN

## 2018-01-29 MED ORDER — MEGESTROL ACETATE 40 MG PO TABS: 40.0000 mg | ORAL_TABLET | Freq: Two times a day (BID) | ORAL | 0 refills | Status: AC

## 2018-01-29 MED ORDER — HYDROCHLOROTHIAZIDE 25 MG PO TABS: 25.0000 mg | ORAL_TABLET | Freq: Every day | ORAL | 5 refills | Status: DC

## 2018-01-29 MED FILL — MEGESTROL 40 MG TABLET: 40 | 30 days supply | Qty: 60 | Fill #0

## 2018-01-29 MED FILL — HYDROCHLOROTHIAZIDE 25 MG T: 25 | 30 days supply | Qty: 30 | Fill #0

## 2018-01-29 NOTE — Progress Notes (Signed)
Assessment & Plan:  Meagan Nelson was seen today for hospitalization follow-up.  Diagnoses and all orders for this visit:  Acute pain of right knee -     CBC -     ibuprofen (ADVIL,MOTRIN) 800 MG tablet; Take 1 tablet (800 mg total) by mouth every 6 (six) hours as needed for moderate pain. STOP SMOKING Work on losing weight to help reduce joint pain. May alternate with heat and ice application for pain relief. May also alternate with acetaminophen  as prescribed pain relief. Other alternatives include massage, acupuncture and water aerobics.  You must stay active and avoid a sedentary lifestyle.   Morbid obesity with BMI of 40.0-44.9, adult (HCC) -     CMP14+EGFR -     Lipid panel Discussed diet and exercise for person with BMI >40. Instructed: You must burn more calories than you eat. Losing 5 percent of your body weight should be considered a success. In the longer term, losing more than 15 percent of your body weight and staying at this weight is an extremely good result. However, keep in mind that even losing 5 percent of your body weight leads to important health benefits, so try not to get discouraged if you're not able to lose more than this. Will recheck weight in 3-6 months.  Essential hypertension -     hydrochlorothiazide (HYDRODIURIL) 25 MG tablet; Take 1 tablet (25 mg total) by mouth daily. Continue all antihypertensives as prescribed.  Remember to bring in your blood pressure log with you for your follow up appointment.  DASH/Mediterranean Diets are healthier choices for HTN.   DUB (dysfunctional uterine bleeding) -     megestrol (MEGACE) 40 MG tablet; Take 1 tablet (40 mg total) by mouth 2 (two) times daily. Will need to follow up with GYN regarding possible hysterectomy. PAP pending   Patient has been counseled on age-appropriate routine health concerns for screening and prevention. These are reviewed and up-to-date. Referrals have been placed accordingly. Immunizations are  up-to-date or declined.    Subjective:   Chief Complaint  Patient presents with  . Hospitalization Follow-up    Pt. is here for HFU on her right knee pain. Patient have a knee brace on and trying to get insurance.   HPI Meagan Nelson 42 y.o. female presents to office today for follow up to right knee pain with effusion, HTN, and DUB.    Essential Hypertension Blood pressure is well controlled on HCTZ '25mg'$  daily. She endorses medication compliance. She is not diet or exercise compliant. Overdue for eye exam. She currently denies chest pain, shortness of breath, palpitations, lightheadedness, dizziness, headaches or BLE edema.  BP Readings from Last 3 Encounters:  01/29/18 118/79  12/23/17 (!) 180/110  04/13/17 (!) 135/98   Dysfunctional Uterine Bleeding Patient complains of irregular menses. Menstrual bleeding lasting up to 2 weeks. She had a hysteroscopy (D&C) performed 02-2017.States her menorrhagia did not improve after her procedure and she was lost to follow up with GYN. She has taken OCPs and megace for menorrhagia and iron deficiency anemia. She states the OCPs "did not work" and the megace helped to decrease the intensity of her bleeding although she was only taking it once a week instead of twice a day as prescribed.  She is due for PAP smear. I have requested she call the office to be worked in for a PAP as soon as her menstrual cycle is over. She is currently bleeding. She is taking OTC iron (2 tablets per day).  Will check CBC.  She is currently uninsured. She states she did not about the financial assistance program however it was noted to have been discussed with her on 2 separate office visits by 2 seperate providers. Patient has been advised to apply for financial assistance and schedule to see our financial counselor.   Lab Results  Component Value Date   WBC 11.7 (H) 03/06/2017   HGB 16.0 (H) 03/11/2017   HCT 47.0 (H) 03/11/2017   MCV 85.6 03/06/2017   PLT 241 03/06/2017    Acute Right Knee Pain She is currently wearing a soft leg brace on the right leg. Was seen in the ED on 12-23-2017 with complaints of right knee pain with onset 2 weeks prior. She denied any injury or recent fall at that time. However pt reported that 1 year ago she sustained a fall landing on her R knee and had similar pain which resolved eventually  with RICE precautions. She feels that her knee is veryShe was noted for effusion and had joint aspiration performed. Uric acid level obtained from sample was normal. It was felt her swelling and pain were due to inflammatory arthritis. She was instructed to follow up with Ortho however she is uninsured so she has yet to follow up. Aggravating factors today include: weight bearing and driving. Relieving factors: ibuprofen '600mg'$  and rest.    Review of Systems  Constitutional: Negative for fever, malaise/fatigue and weight loss.  HENT: Negative.  Negative for nosebleeds.   Eyes: Negative.  Negative for blurred vision, double vision and photophobia.  Respiratory: Negative.  Negative for cough and shortness of breath.   Cardiovascular: Negative.  Negative for chest pain, palpitations and leg swelling.  Gastrointestinal: Negative.  Negative for heartburn, nausea and vomiting.  Genitourinary:       SEE HPI  Musculoskeletal: Positive for joint pain. Negative for myalgias.  Neurological: Negative.  Negative for dizziness, focal weakness, seizures and headaches.  Psychiatric/Behavioral: Negative.  Negative for suicidal ideas.    Past Medical History:  Diagnosis Date  . Abscess    facial  . Anemia   . Headache   . History of blood transfusion   . Hypertension    PMH  . Insomnia     Past Surgical History:  Procedure Laterality Date  . CESAREAN SECTION     C/S x 1  . DILATATION & CURETTAGE/HYSTEROSCOPY WITH MYOSURE N/A 03/11/2017   Procedure: DILATATION & CURETTAGE/HYSTEROSCOPY WITH MYOSURE;  Surgeon: Emily Filbert, MD;  Location: [facility];  Service: Gynecology;  Laterality: N/A;  . DILATION AND CURETTAGE OF UTERUS    . TONSILLECTOMY      Family History  Problem Relation Age of Onset  . Hypertension Mother   . Anemia Sister   . Anemia Brother   . Anemia Sister   . Anemia Other   . Hypertension Other     Social History Reviewed with no changes to be made today.   Outpatient Medications Prior to Visit  Medication Sig Dispense Refill  . hydrochlorothiazide (HYDRODIURIL) 25 MG tablet Take 1 tablet (25 mg total) by mouth daily. 90 tablet 5  . megestrol (MEGACE) 40 MG tablet TAKE 1 TABLET BY MOUTH TWICE A DAY 60 tablet 0  . ibuprofen (ADVIL,MOTRIN) 600 MG tablet Take 1 tablet (600 mg total) by mouth every 6 (six) hours as needed. (Patient not taking: Reported on 12/23/2017) 30 tablet 1  . norethindrone-ethinyl estradiol 1/35 ([place] 1/35, 28,) tablet Take 1 tablet by mouth  daily. 1 by mouth three times a day for 3 days then 2 a day for 3 days then one daily (Patient not taking: Reported on 12/23/2017) 1 Package 11   No facility-administered medications prior to visit.     Allergies  Allergen Reactions  . Naprosyn [Naproxen] Nausea And Vomiting       Objective:    BP 118/79 (BP Location: Left Arm, Patient Position: Sitting, Cuff Size: Large)   Pulse 100   Temp 98.7 F (37.1 C) (Oral)   Ht '5\' 5"'$  (1.651 m)   Wt 259 lb (117.5 kg)   LMP 01/26/2018   SpO2 96%   BMI 43.10 kg/m  Wt Readings from Last 3 Encounters:  01/29/18 259 lb (117.5 kg)  04/13/17 251 lb 1.6 oz (113.9 kg)  03/11/17 250 lb 8 oz (113.6 kg)    Physical Exam  Constitutional: She is oriented to person, place, and time. She appears well-developed and well-nourished. She is cooperative.  HENT:  Head: Normocephalic and atraumatic.  Eyes: EOM are normal.  Neck: Normal range of motion.  Cardiovascular: Normal rate, regular rhythm, normal heart sounds and intact distal pulses. Exam reveals no gallop and no friction rub.  No murmur  heard. Pulmonary/Chest: Effort normal and breath sounds normal. No tachypnea. No respiratory distress. She has no decreased breath sounds. She has no wheezes. She has no rhonchi. She has no rales. She exhibits no tenderness.  Abdominal: Soft. Bowel sounds are normal.  Musculoskeletal: She exhibits no edema.       Right knee: She exhibits decreased range of motion. She exhibits no swelling. Tenderness found. Patellar tendon tenderness noted.  Neurological: She is alert and oriented to person, place, and time. Coordination normal.  Skin: Skin is warm and dry.  Psychiatric: She has a normal mood and affect. Her behavior is normal. Judgment and thought content normal.  Nursing note and vitals reviewed.     Patient has been counseled extensively about nutrition and exercise as well as the importance of adherence with medications and regular follow-up. The patient was given clear instructions to go to ER or return to medical center if symptoms don't improve, worsen or new problems develop. The patient verbalized understanding.   Follow-up: Return for PAP SMEAR patient will call to schedule and be worked in for PAP.   Gildardo Pounds, FNP-BC [facility] and [place], [place]   01/29/2018, 12:28 PM

## 2018-01-29 NOTE — Patient Instructions (Signed)
Knee Effusion Knee effusion means that you have extra fluid in your knee. This can cause pain. Your knee may be more difficult to bend and move. Follow these instructions at home:  Use crutches as told by your doctor.  Wear a knee brace as told by your doctor.  Apply ice to the swollen area: ? Put ice in a plastic bag. ? Place a towel between your skin and the bag. ? Leave the ice on for 20 minutes, 2-3 times per day.  Keep your knee raised (elevated) when you are sitting or lying down.  Take medicines only as told by your doctor.  Do any rehabilitation or strengthening exercises as told by your doctor.  Rest your knee as told by your doctor. You may start doing your normal activities again when your doctor says it is okay.  Keep all follow-up visits as told by your doctor. This is important. Contact a doctor if:  You continue to have pain in your knee. Get help right away if:  You have increased swelling or redness of your knee.  You have severe pain in your knee.  You have a fever. This information is not intended to replace advice given to you by your health care provider. Make sure you discuss any questions you have with your health care provider. Document Released: 04/05/2010 Document Revised: 08/09/2015 Document Reviewed: 10/17/2013 Elsevier Interactive Patient Education  2018 Elsevier Inc.  

## 2018-01-30 ENCOUNTER — Other Ambulatory Visit: Payer: Self-pay | Admitting: Obstetrics & Gynecology

## 2018-01-30 LAB — CBC
HEMATOCRIT: 41.7 % (ref 34.0–46.6)
HEMOGLOBIN: 13.3 g/dL (ref 11.1–15.9)
MCH: 24.4 pg — ABNORMAL LOW (ref 26.6–33.0)
MCHC: 31.9 g/dL (ref 31.5–35.7)
MCV: 76 fL — ABNORMAL LOW (ref 79–97)
Platelets: 308 10*3/uL (ref 150–450)
RBC: 5.46 x10E6/uL — AB (ref 3.77–5.28)
RDW: 18.3 % — ABNORMAL HIGH (ref 12.3–15.4)
WBC: 8 10*3/uL (ref 3.4–10.8)

## 2018-01-30 LAB — LIPID PANEL
CHOLESTEROL TOTAL: 175 mg/dL (ref 100–199)
Chol/HDL Ratio: 5.5 ratio — ABNORMAL HIGH (ref 0.0–4.4)
HDL: 32 mg/dL — AB (ref >39)
LDL Calculated: 121 mg/dL — ABNORMAL HIGH (ref 0–99)
TRIGLYCERIDES: 111 mg/dL (ref 0–149)
VLDL CHOLESTEROL CAL: 22 mg/dL (ref 5–40)

## 2018-01-30 LAB — CMP14+EGFR
A/G RATIO: 1.7 (ref 1.2–2.2)
ALT: 18 IU/L (ref 0–32)
AST: 20 IU/L (ref 0–40)
Albumin: 4.4 g/dL (ref 3.5–5.5)
Alkaline Phosphatase: 64 IU/L (ref 39–117)
BILIRUBIN TOTAL: 0.3 mg/dL (ref 0.0–1.2)
BUN/Creatinine Ratio: 9 (ref 9–23)
BUN: 7 mg/dL (ref 6–24)
CO2: 28 mmol/L (ref 20–29)
Calcium: 9.8 mg/dL (ref 8.7–10.2)
Chloride: 99 mmol/L (ref 96–106)
Creatinine, Ser: 0.77 mg/dL (ref 0.57–1.00)
GFR calc non Af Amer: 96 mL/min/{1.73_m2} (ref >59)
GFR, EST AFRICAN AMERICAN: 110 mL/min/{1.73_m2} (ref >59)
GLUCOSE: 96 mg/dL (ref 65–99)
Globulin, Total: 2.6 g/dL (ref 1.5–4.5)
POTASSIUM: 3.1 mmol/L — AB (ref 3.5–5.2)
Sodium: 145 mmol/L — ABNORMAL HIGH (ref 134–144)
Total Protein: 7 g/dL (ref 6.0–8.5)

## 2018-02-01 ENCOUNTER — Other Ambulatory Visit: Payer: Self-pay | Admitting: Nurse Practitioner

## 2018-02-01 MED ORDER — AMLODIPINE BESYLATE 5 MG PO TABS: 5.0000 mg | ORAL_TABLET | Freq: Every day | ORAL | 3 refills | Status: DC

## 2018-02-03 ENCOUNTER — Telehealth: Payer: Self-pay

## 2018-02-03 NOTE — Telephone Encounter (Signed)
CMA attempt to reach patient to inform on results.  No answer and left a VM for patient to call back.  

## 2018-02-03 NOTE — Telephone Encounter (Signed)
-----   Message from Claiborne RiggZelda W Fleming, NP sent at 02/01/2018  8:17 PM EST ----- Hgb levels are borderline. Please make sure you have applied for insurance or updated your insurance information so that you can be referred to gynecology for recommendations regarding your cycles. Potassium levels are low. I  Would like to change your blood pressure medication to help prevent your potassium levels dropping. I am sending a new script to the pharmacy. Please stop taking HCTZ and start amlodipine. You will need to have your blood pressure checked in 3 weeks. Please make an appointment.

## 2018-02-05 NOTE — Telephone Encounter (Signed)
A letter will be send out to reach patient  

## 2018-02-15 ENCOUNTER — Other Ambulatory Visit: Payer: Self-pay | Admitting: Obstetrics & Gynecology

## 2018-02-17 MED FILL — HYDROCHLOROTHIAZIDE 25 MG T: 25 | 30 days supply | Qty: 30 | Fill #0

## 2018-02-17 MED FILL — IBUPROFEN 800 MG TABLET: 800 | 15 days supply | Qty: 60 | Fill #0

## 2018-03-15 ENCOUNTER — Encounter: Payer: Self-pay | Admitting: Pharmacist

## 2018-03-15 MED FILL — AMLODIPINE BESYLATE 5 MG TA: 5 | 30 days supply | Qty: 30 | Fill #0

## 2018-03-15 NOTE — Progress Notes (Deleted)
   S:    PCP: [name]  Patient arrives ***.    Presents to the clinic for hypertension evaluation, counseling, and management. Patient was referred by [name] on 01/29/18.  BP at that visit 118/79. BMP resulted after that appointment revealed low potassium. Pt's HCTZ stopped and amlodipine started. She was told to follow-up with me for a BP check.   Patient {Actions; denies-reports:120008} adherence with medications.  Current BP Medications include:  Amlodipine 10 mg daily  Antihypertensives tried in the past include: HCTZ 25 mg (stopped d/t hypokalemia)  Dietary habits include: *** Exercise habits include:*** Family / Social history: HTN (mother), current some day smoker (0.1 PPD), drinks alcohol occasionally   ASCVD risk factors include: hyperlipidemia, HTN, current smoker  Home BP readings: ***  O:  L arm after 5 minutes rest: ***, HR ***  Last 3 Office BP readings: BP Readings from Last 3 Encounters:  01/29/18 118/79  12/23/17 (!) 180/110  04/13/17 (!) 135/98   BMET    Component Value Date/Time   NA 145 (H) 01/29/2018 1217   K 3.1 (L) 01/29/2018 1217   CL 99 01/29/2018 1217   CO2 28 01/29/2018 1217   GLUCOSE 96 01/29/2018 1217   GLUCOSE 98 03/11/2017 1342   BUN 7 01/29/2018 1217   CREATININE 0.77 01/29/2018 1217   CALCIUM 9.8 01/29/2018 1217   GFRNONAA 96 01/29/2018 1217   GFRAA 110 01/29/2018 1217    Renal function: CrCl cannot be calculated (Patient's most recent lab result is older than the maximum 21 days allowed.).  Clinical ASCVD: No  The 10-year ASCVD risk score Denman George(Goff DC Jr., et al., 2013) is: 6.5%   Values used to calculate the score:     Age: [age] years     Sex: Female     Is Non-Hispanic African American: Yes     Diabetic: No     Tobacco smoker: Yes     Systolic Blood Pressure: 118 mmHg     Is BP treated: Yes     HDL Cholesterol: 32 mg/dL     Total Cholesterol: 175 mg/dL  A/P: Hypertension longstanding currently *** on current medications. BP Goal  <130/80 mmHg. Patient {Is/is not:9024} adherent with current medications.  -{Meds adjust:18428} ***.  -F/u labs ordered - *** -ASCVD: primary prevention in patient. No DM. Last LDL 121. Risk score places pt in borderline risk category.  -HM: PNA, tetanus -Counseled on lifestyle modifications for blood pressure control including reduced dietary sodium, increased exercise, adequate sleep  Results reviewed and written information provided.   Total time in face-to-face counseling *** minutes.   F/U Clinic Visit in ***.  Patient seen with ***

## 2018-03-18 ENCOUNTER — Encounter: Payer: Self-pay | Admitting: Pharmacist

## 2018-03-25 ENCOUNTER — Ambulatory Visit: Payer: Self-pay | Attending: Nurse Practitioner | Admitting: Pharmacist

## 2018-03-25 ENCOUNTER — Encounter: Payer: Self-pay | Admitting: Pharmacist

## 2018-03-25 VITALS — BP 150/97 | HR 84

## 2018-03-25 DIAGNOSIS — F17210 Nicotine dependence, cigarettes, uncomplicated: Secondary | ICD-10-CM | POA: Insufficient documentation

## 2018-03-25 DIAGNOSIS — Z79899 Other long term (current) drug therapy: Secondary | ICD-10-CM | POA: Insufficient documentation

## 2018-03-25 DIAGNOSIS — I10 Essential (primary) hypertension: Secondary | ICD-10-CM | POA: Insufficient documentation

## 2018-03-25 MED ORDER — AMLODIPINE BESYLATE 10 MG PO TABS: 10.0000 mg | ORAL_TABLET | Freq: Every day | ORAL | 2 refills | Status: DC

## 2018-03-25 MED FILL — AMLODIPINE BESYLATE 10 MG T: 10 | 30 days supply | Qty: 30 | Fill #0

## 2018-03-25 NOTE — Patient Instructions (Signed)
Thank you for coming to see us today.   Blood pressure today is elevated.  Start taking amlodipine 10 mg daily. You can take two of the 5 mg tablets for now until your run out. Then go pick up the 10 mg tablet from the pharmacy.  Limiting salt and caffeine, as well as exercising as able for at least 30 minutes for 5 days out of the week, can also help you lower your blood pressure.  Take your blood pressure at home if you are able. Please write down these numbers and bring them to your visits.  If you have any questions about medications, please call me [phone].  Franky MachoLuke

## 2018-03-25 NOTE — Progress Notes (Signed)
   S: PCP: Bertram Denver     Patient arrives in good spirits. Presents to the clinic for hypertension management. Patient was referred by [name] on 02/01/18. Pt's potassium from that visit was 3.1 and her HCTZ had to be stopped. She was started on amlodipine. Of note, she reports picking the amlodipine up last week.  Patient reports adherence with medications. Denies chest pain, shortness of breath, HA, or blurred vision.  Current BP Medications include:  Amlodipine 5 mg   Dietary habits include: limits salt, drinks coffee in the mornings  Exercise habits include:nothing outside  Family / Social history: smokes 1 cigarette in the morning before work, alcohol occasionally   Home BP readings: checks at work - reports 140s/90s since stopping HCTZ  O:  L arm after 5 minutes rest: 150/97, 84  Last 3 Office BP readings: BP Readings from Last 3 Encounters:  03/25/18 (!) 150/97  01/29/18 118/79  12/23/17 (!) 180/110   BMET    Component Value Date/Time   NA 145 (H) 01/29/2018 1217   K 3.1 (L) 01/29/2018 1217   CL 99 01/29/2018 1217   CO2 28 01/29/2018 1217   GLUCOSE 96 01/29/2018 1217   GLUCOSE 98 03/11/2017 1342   BUN 7 01/29/2018 1217   CREATININE 0.77 01/29/2018 1217   CALCIUM 9.8 01/29/2018 1217   GFRNONAA 96 01/29/2018 1217   GFRAA 110 01/29/2018 1217    Renal function: CrCl cannot be calculated (Patient's most recent lab result is older than the maximum 21 days allowed.).  Clinical ASCVD: No  The 10-year ASCVD risk score Denman George DC Jr., et al., 2013) is: 21.2%   Values used to calculate the score:     Age: 43 years     Sex: Female     Is Non-Hispanic African American: Yes     Diabetic: No     Tobacco smoker: Yes     Systolic Blood Pressure: 150 mmHg     Is BP treated: Yes     HDL Cholesterol: 32 mg/dL     Total Cholesterol: 175 mg/dL   A/P: Hypertension longstanding currently uncontrolled on current medications. BP Goal <130/80 mmHg. Patient is adherent with  current medications. I will go ahead and increase her amlodipine given her degree of BP elevation. She will see me again in 1 month.  -Increased dose of amlodipine to 10 mg daily.  -Counseled on lifestyle modifications for blood pressure control including reduced dietary sodium, increased exercise, adequate sleep  Results reviewed and written information provided.   Total time in face-to-face counseling 15 minutes.   F/U Clinic Visit in 1 month.    Patient seen with: Arletha Pili, PharmD Candidate  [employer]  Class of 2022  Butch Penny, PharmD, CPP Clinical Pharmacist [facility] [phone]

## 2018-03-26 ENCOUNTER — Other Ambulatory Visit: Payer: Self-pay | Admitting: Obstetrics & Gynecology

## 2018-03-26 ENCOUNTER — Other Ambulatory Visit: Payer: Self-pay

## 2018-03-26 ENCOUNTER — Encounter: Payer: Self-pay | Admitting: Nurse Practitioner

## 2018-03-26 ENCOUNTER — Ambulatory Visit: Payer: Self-pay | Attending: Nurse Practitioner | Admitting: Nurse Practitioner

## 2018-03-26 ENCOUNTER — Encounter: Payer: Self-pay | Admitting: Pharmacist

## 2018-03-26 VITALS — BP 146/102 | HR 93 | Temp 98.3°F | Resp 16 | Ht 66.0 in | Wt 261.0 lb

## 2018-03-26 DIAGNOSIS — I10 Essential (primary) hypertension: Secondary | ICD-10-CM

## 2018-03-26 DIAGNOSIS — Z6841 Body Mass Index (BMI) 40.0 and over, adult: Secondary | ICD-10-CM

## 2018-03-26 DIAGNOSIS — Z124 Encounter for screening for malignant neoplasm of cervix: Secondary | ICD-10-CM

## 2018-03-26 MED ORDER — HYDROCHLOROTHIAZIDE 25 MG PO TABS: 25.0000 mg | ORAL_TABLET | Freq: Every day | ORAL | 3 refills | Status: DC

## 2018-03-26 MED FILL — HYDROCHLOROTHIAZIDE 25 MG T: 25 | 30 days supply | Qty: 30 | Fill #1

## 2018-03-26 NOTE — Progress Notes (Signed)
PAP only BP meds were increase at yesterday's visit. Has concerns.

## 2018-03-26 NOTE — Progress Notes (Signed)
Assessment & Plan:  Meagan Nelson was seen today for gynecologic exam.  Diagnoses and all orders for this visit:  Encounter for Papanicolaou smear for cervical cancer screening -     Cytology - PAP  Essential hypertension Continue all antihypertensives as prescribed.  Remember to bring in your blood pressure log with you for your follow up appointment.  DASH/Mediterranean Diets are healthier choices for HTN.  Has appt for BP recheck in 2-3 weeks   Morbid obesity with BMI of 40.0-44.9, adult (HCC) Discussed diet and exercise for person with BMI >42. Instructed: You must burn more calories than you eat. Losing 5 percent of your body weight should be considered a success. In the longer term, losing more than 15 percent of your body weight and staying at this weight is an extremely good result. However, keep in mind that even losing 5 percent of your body weight leads to important health benefits, so try not to get discouraged if you're not able to lose more than this. Will recheck weight in 3-6 months.    Patient has been counseled on age-appropriate routine health concerns for screening and prevention. These are reviewed and up-to-date. Referrals have been placed accordingly. Immunizations are up-to-date or declined.    Subjective:   Chief Complaint  Patient presents with  . Gynecologic Exam   HPI Meagan Nelson 43 y.o. female presents to office today for PAP. She has a history of AUB with menorrhagia and IDA. She is s/p hysteroscopy with lack of GYN follow up thereafter.  Currently taking megace BID. Last menstrual cycle the first of December.  Will need to be referred back to GYN. Patient has been advised to apply for financial assistance and schedule to see our financial counselor.    Essential Hypertension Just started amlodipine 10mg  today after it was increased yesterday by pharmacist here at the clinic. Will f/u with Franky MachoLuke in 2-3 weeks and if still high will likely need  spironolactone. She was previously on HCTZ however side effects were hypokalemia. Denies chest pain, shortness of breath, palpitations, lightheadedness, dizziness, headaches or BLE edema.  BP Readings from Last 3 Encounters:  03/26/18 (!) 143/103  03/25/18 (!) 150/97  01/29/18 118/79    Review of Systems  Constitutional: Negative.  Negative for chills, fever, malaise/fatigue and weight loss.  Respiratory: Negative.  Negative for cough, shortness of breath and wheezing.   Cardiovascular: Negative.  Negative for chest pain, orthopnea and leg swelling.  Gastrointestinal: Negative for abdominal pain.  Genitourinary: Negative for flank pain.       AUB  Skin: Negative.  Negative for rash.  Psychiatric/Behavioral: Negative for suicidal ideas.    Past Medical History:  Diagnosis Date  . Abscess    facial  . Anemia   . Headache   . History of blood transfusion   . Hypertension    PMH  . Insomnia     Past Surgical History:  Procedure Laterality Date  . CESAREAN SECTION     C/S x 1  . DILATATION & CURETTAGE/HYSTEROSCOPY WITH MYOSURE N/A 03/11/2017   Procedure: DILATATION & CURETTAGE/HYSTEROSCOPY WITH MYOSURE;  Surgeon: Allie Bossierove, Myra C, MD;  Location: [facility];  Service: Gynecology;  Laterality: N/A;  . DILATION AND CURETTAGE OF UTERUS    . TONSILLECTOMY      Family History  Problem Relation Age of Onset  . Hypertension Mother   . Anemia Sister   . Anemia Brother   . Anemia Sister   . Anemia Other   .  Hypertension Other     Social History Reviewed with no changes to be made today.   Outpatient Medications Prior to Visit  Medication Sig Dispense Refill  . amLODipine (NORVASC) 10 MG tablet Take 1 tablet (10 mg total) by mouth daily. 30 tablet 2  . IBU 600 MG tablet TAKE 1 TABLET BY MOUTH EVERY 6 HOURS IF NEEDED 30 tablet 0  . megestrol (MEGACE) 40 MG tablet TAKE 1 TABLET BY MOUTH TWICE A DAY 60 tablet 0  . megestrol (MEGACE) 40 MG tablet TAKE 1 TABLET BY MOUTH  TWICE A DAY 60 tablet 0   No facility-administered medications prior to visit.     Allergies  Allergen Reactions  . Naprosyn [Naproxen] Nausea And Vomiting       Objective:    BP (!) 143/103 (BP Location: Left Arm, Patient Position: Sitting, Cuff Size: Large)   Pulse 93   Temp 98.3 F (36.8 C) (Oral)   Resp 16   Ht 5\' 6"  (1.676 m)   Wt 261 lb (118.4 kg)   SpO2 100%   BMI 42.13 kg/m  Wt Readings from Last 3 Encounters:  03/26/18 261 lb (118.4 kg)  01/29/18 259 lb (117.5 kg)  04/13/17 251 lb 1.6 oz (113.9 kg)    Physical Exam Constitutional:      Appearance: She is well-developed.  HENT:     Head: Normocephalic.  Cardiovascular:     Rate and Rhythm: Normal rate and regular rhythm.     Heart sounds: Normal heart sounds.  Pulmonary:     Effort: Pulmonary effort is normal.     Breath sounds: Normal breath sounds.  Abdominal:     General: Bowel sounds are normal.     Palpations: Abdomen is soft.     Hernia: There is no hernia in the right inguinal area or left inguinal area.  Genitourinary:    Labia:        Right: No rash, tenderness, lesion or injury.        Left: No rash, tenderness, lesion or injury.      Vagina: Normal. No signs of injury and foreign body. No vaginal discharge, erythema, tenderness or bleeding.     Cervix: No cervical motion tenderness or friability.     Uterus: Not deviated and not enlarged.      Adnexa:        Right: No mass, tenderness or fullness.         Left: No mass, tenderness or fullness.       Rectum: Normal. No external hemorrhoid.  Lymphadenopathy:     Lower Body: No right inguinal adenopathy. No left inguinal adenopathy.  Skin:    General: Skin is warm and dry.  Neurological:     Mental Status: She is alert and oriented to person, place, and time.  Psychiatric:        Behavior: Behavior normal.        Thought Content: Thought content normal.        Judgment: Judgment normal.          Patient has been counseled  extensively about nutrition and exercise as well as the importance of adherence with medications and regular follow-up. The patient was given clear instructions to go to ER or return to medical center if symptoms don't improve, worsen or new problems develop. The patient verbalized understanding.   Follow-up: Return in about 3 weeks (around 04/16/2018) for 3 months with me for HTN .   Claiborne Rigg, FNP-BC  [facility] [place], [place] [phone]   03/26/2018, 9:14 AM

## 2018-03-31 ENCOUNTER — Other Ambulatory Visit: Payer: Self-pay | Admitting: Nurse Practitioner

## 2018-03-31 LAB — CYTOLOGY - PAP
BACTERIAL VAGINITIS: POSITIVE — AB
CANDIDA VAGINITIS: NEGATIVE
CHLAMYDIA, DNA PROBE: NEGATIVE
Diagnosis: NEGATIVE
HPV: NOT DETECTED
Neisseria Gonorrhea: NEGATIVE
Trichomonas: NEGATIVE

## 2018-03-31 MED ORDER — METRONIDAZOLE 500 MG PO TABS: 500.0000 mg | ORAL_TABLET | Freq: Two times a day (BID) | ORAL | 0 refills | Status: AC

## 2018-04-01 ENCOUNTER — Telehealth (INDEPENDENT_AMBULATORY_CARE_PROVIDER_SITE_OTHER): Payer: Self-pay

## 2018-04-01 MED FILL — metroNIDAZOLE 500 MG TABS: 500 | 7 days supply | Qty: 14 | Fill #0

## 2018-04-01 NOTE — Telephone Encounter (Signed)
Patient returned call and was informed that pap was negative for cervical cancer but positive for BV. Prescription sent to pharmacy. Maryjean Morn, CMA

## 2018-04-01 NOTE — Telephone Encounter (Signed)
-----   Message from Claiborne Rigg, NP sent at 03/31/2018 11:30 PM EST ----- PAP smear negative for cervical cancer but positive for bacterial vaginosis. Script sent to pharmacy

## 2018-04-01 NOTE — Telephone Encounter (Signed)
Left message asking patient to return call to [phone]. Maryjean Morn, CMA

## 2018-04-01 NOTE — Telephone Encounter (Signed)
-----   Message from [name] W Fleming, NP sent at 03/31/2018 11:30 PM EST ----- PAP smear negative for cervical cancer but positive for bacterial vaginosis. Script sent to pharmacy 

## 2018-04-20 ENCOUNTER — Ambulatory Visit: Payer: Self-pay | Admitting: Nurse Practitioner

## 2018-04-22 ENCOUNTER — Encounter: Payer: Self-pay | Admitting: Pharmacist

## 2018-04-26 ENCOUNTER — Other Ambulatory Visit: Payer: Self-pay | Admitting: Obstetrics & Gynecology

## 2018-05-28 MED FILL — HYDROCHLOROTHIAZIDE 25 MG T: 25 | 30 days supply | Qty: 30 | Fill #2

## 2018-06-25 ENCOUNTER — Other Ambulatory Visit: Payer: Self-pay | Admitting: Obstetrics & Gynecology

## 2018-06-26 ENCOUNTER — Other Ambulatory Visit: Payer: Self-pay

## 2018-06-26 ENCOUNTER — Emergency Department (HOSPITAL_COMMUNITY)
Admission: EM | Admit: 2018-06-26 | Discharge: 2018-06-26 | Disposition: A | Discharge status: Home / Self Care | Payer: Self-pay | Attending: Emergency Medicine | Admitting: Emergency Medicine

## 2018-06-26 ENCOUNTER — Encounter (HOSPITAL_COMMUNITY): Payer: Self-pay | Admitting: Emergency Medicine

## 2018-06-26 DIAGNOSIS — I10 Essential (primary) hypertension: Secondary | ICD-10-CM | POA: Insufficient documentation

## 2018-06-26 DIAGNOSIS — Y93F2 Activity, caregiving, lifting: Secondary | ICD-10-CM | POA: Insufficient documentation

## 2018-06-26 DIAGNOSIS — X500XXA Overexertion from strenuous movement or load, initial encounter: Secondary | ICD-10-CM | POA: Insufficient documentation

## 2018-06-26 DIAGNOSIS — S39012A Strain of muscle, fascia and tendon of lower back, initial encounter: Secondary | ICD-10-CM | POA: Insufficient documentation

## 2018-06-26 DIAGNOSIS — F17210 Nicotine dependence, cigarettes, uncomplicated: Secondary | ICD-10-CM | POA: Insufficient documentation

## 2018-06-26 DIAGNOSIS — Y990 Civilian activity done for income or pay: Secondary | ICD-10-CM | POA: Insufficient documentation

## 2018-06-26 DIAGNOSIS — Y929 Unspecified place or not applicable: Secondary | ICD-10-CM | POA: Insufficient documentation

## 2018-06-26 MED ORDER — CYCLOBENZAPRINE HCL 10 MG PO TABS: 10.0000 mg | ORAL_TABLET | Freq: Two times a day (BID) | ORAL | 0 refills | Status: DC | PRN

## 2018-06-26 MED ORDER — HYDROCODONE-ACETAMINOPHEN 5-325 MG PO TABS: 1.0000 | ORAL_TABLET | Freq: Four times a day (QID) | ORAL | 0 refills | Status: DC | PRN

## 2018-06-26 NOTE — ED Notes (Signed)
Patient verbalizes understanding of discharge instructions. Opportunity for questioning and answers were provided. Armband removed by staff, pt discharged from ED ambulatory.   

## 2018-06-26 NOTE — ED Provider Notes (Signed)
[name] Provider Note   CSN: [id number] Arrival date & time: 06/26/18  1515    History   Chief Complaint Chief Complaint  Patient presents with  . Back Pain    HPI Meagan RaiderKeshia Nelson is a 43 y.o. female.     The history is provided by the patient. No language interpreter was used.  Back Pain   Meagan RaiderKeshia Nelson is a 43 y.o. female who presents to the Emergency Department complaining of back pain. She presents to the emergency department complaining of back pain that began on Thursday. She states that she thinks the pain began when she was at work on Wednesday and had to lift a resident off the floor. She did not have pain at that time but when she awoke Thursday morning she felt a twinge in her right lower back. She describes it as a muscle spasm sensation but that radiates to her right buttock at times. Pain is worse with laying down and sitting on it and better with standing. She denies any fevers, abdominal pain, numbness, weakness, nausea, vomiting, dysuria, hematuria, urinary incontinence. She has a history of hypertension, no additional medical problems. She does not smoke, drink alcohol or use drugs. She has been treating her symptoms at home with ibuprofen and Lidoderm patches with no significant improvement in her pain. Past Medical History:  Diagnosis Date  . Abscess    facial  . Anemia   . Headache   . History of blood transfusion   . Hypertension    PMH  . Insomnia     Patient Active Problem List   Diagnosis Date Noted  . Morbid obesity with BMI of 40.0-44.9, adult (HCC) 03/26/2018  . Essential hypertension 02/17/2017  . Abnormal uterine bleeding 01/19/2017  . Cellulitis of face   . Symptomatic anemia 09/01/2014  . Microcytic hypochromic anemia 09/01/2014  . Dental infection 09/01/2014    Past Surgical History:  Procedure Laterality Date  . CESAREAN SECTION     C/S x 1  . DILATATION & CURETTAGE/HYSTEROSCOPY WITH MYOSURE  N/A 03/11/2017   Procedure: DILATATION & CURETTAGE/HYSTEROSCOPY WITH MYOSURE;  Surgeon: Allie Bossierove, Myra C, MD;  Location: [facility];  Service: Gynecology;  Laterality: N/A;  . DILATION AND CURETTAGE OF UTERUS    . TONSILLECTOMY       OB History    Gravida  7   Para  0   Term      Preterm      AB  1   Living  6     SAB  1   TAB      Ectopic      Multiple      Live Births  5            Home Medications    Prior to Admission medications   Medication Sig Start Date End Date Taking? Authorizing Provider  amLODipine (NORVASC) 10 MG tablet Take 1 tablet (10 mg total) by mouth daily. 03/25/18   Hoy RegisterNewlin, Enobong, MD  cyclobenzaprine (FLEXERIL) 10 MG tablet Take 1 tablet (10 mg total) by mouth 2 (two) times daily as needed for muscle spasms. 06/26/18   Tilden Fossaees, Estill Llerena, MD  HYDROcodone-acetaminophen (NORCO/VICODIN) 5-325 MG tablet Take 1 tablet by mouth every 6 (six) hours as needed. 06/26/18   Tilden Fossaees, Layla Gramm, MD  IBU 600 MG tablet TAKE 1 TABLET BY MOUTH EVERY 6 HOURS IF NEEDED 02/15/18   Allie Bossierove, Myra C, MD  megestrol (MEGACE) 40 MG tablet TAKE  1 TABLET BY MOUTH TWICE DAILY 04/26/18   Allie Bossier, MD    Family History Family History  Problem Relation Age of Onset  . Hypertension Mother   . Anemia Sister   . Anemia Brother   . Anemia Sister   . Anemia Other   . Hypertension Other     Social History Social History   Tobacco Use  . Smoking status: Current Some Day Smoker    Packs/day: 0.10    Types: Cigarettes  . Smokeless tobacco: Never Used  Substance Use Topics  . Alcohol use: Yes    Comment: ocassinally   . Drug use: No     Allergies   Naprosyn [naproxen]   Review of Systems Review of Systems  Musculoskeletal: Positive for back pain.  All other systems reviewed and are negative.    Physical Exam Updated Vital Signs BP (!) 178/100 (BP Location: Right Arm)   Pulse (!) 115   Temp 98.5 F (36.9 C) (Oral)   Resp 18   SpO2 98%    Physical Exam Vitals signs and nursing note reviewed.  Constitutional:      Appearance: She is well-developed.  HENT:     Head: Normocephalic and atraumatic.  Cardiovascular:     Rate and Rhythm: Normal rate and regular rhythm.  Pulmonary:     Effort: Pulmonary effort is normal. No respiratory distress.  Musculoskeletal:     Comments: 2+ DP pulses bilaterally. There is tenderness to palpation over the right lower back and SI joint region. There is no midline tenderness to palpation. No CVA tenderness to palpation.  Skin:    General: Skin is warm and dry.  Neurological:     Mental Status: She is alert and oriented to person, place, and time.     Comments: Five out of five strength in bilateral lower extremities with sensation to light touch intact in bilateral lower extremities  Psychiatric:        Behavior: Behavior normal.      ED Treatments / Results  Labs (all labs ordered are listed, but only abnormal results are displayed) Labs Reviewed - No data to display  EKG None  Radiology No results found.  Procedures Procedures (including critical care time)  Medications Ordered in ED Medications - No data to display   Initial Impression / Assessment and Plan / ED Course  I have reviewed the triage vital signs and the nursing notes.  Pertinent labs & imaging results that were available during my care of the patient were reviewed by me and considered in my medical decision making (see chart for details).        Patient with history of hypertension here for evaluation of acute onset low back pain after lifting injury the day prior. She is neurologically intact on examination. Presentation is not consistent with dissection, renal colic, epidural abscess, cauda equina. Discussed with patient home care for back pain. She drove to the emergency department and declines medications at this time. Will write prescriptions for symptom control. Discussed outpatient follow-up and  return precautions.    Final Clinical Impressions(s) / ED Diagnoses   Final diagnoses:  Strain of lumbar region, initial encounter    ED Discharge Orders         Ordered    cyclobenzaprine (FLEXERIL) 10 MG tablet  2 times daily PRN     06/26/18 1551    HYDROcodone-acetaminophen (NORCO/VICODIN) 5-325 MG tablet  Every 6 hours PRN     06/26/18 1551  Tilden Fossa, MD 06/26/18 [phone]

## 2018-06-26 NOTE — ED Triage Notes (Signed)
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  On Thursday she noticed slight right side lower back pain that started progressing. Pt states back pain is 10/10. Using lidocaine patches and heat packs with no relief

## 2018-07-02 ENCOUNTER — Ambulatory Visit: Payer: Self-pay | Attending: Primary Care | Admitting: Primary Care

## 2018-07-02 ENCOUNTER — Encounter: Payer: Self-pay | Admitting: Primary Care

## 2018-07-02 ENCOUNTER — Other Ambulatory Visit: Payer: Self-pay

## 2018-07-02 DIAGNOSIS — M545 Low back pain, unspecified: Secondary | ICD-10-CM

## 2018-07-02 MED ORDER — CYCLOBENZAPRINE HCL 10 MG PO TABS: 10.0000 mg | ORAL_TABLET | Freq: Three times a day (TID) | ORAL | 1 refills | Status: DC | PRN

## 2018-07-02 MED FILL — HYDROCHLOROTHIAZIDE 25 MG T: 25 | 30 days supply | Qty: 30 | Fill #3

## 2018-07-02 NOTE — Progress Notes (Signed)
Virtual Visit via Telephone Note  I connected with Meagan Nelson on 07/02/18 at 12:10 AM EDT by telephone and verified that I am speaking with the correct person using two identifiers.   I discussed the limitations, risks, security and privacy concerns of performing an evaluation and management service by telephone and the availability of in person appointments. I also discussed with the patient that there may be a patient responsible charge related to this service. The patient expressed understanding and agreed to proceed.   History of Present Illness: Meagan Nelson was at work and a patient had fallen on the floor she assisted her co worker with lifting the patient up. At that time she had no problems the next day she noticed the right side of her back was hurtin used heating pads and ibuprofen little relief. The following day she was stiff increase back pain. She went to the ED on 06/26/2018 and was dx with a strain of lumbar region . She was d/c on hydrocodone 5/325 but started to make her itch so she stopped and flexeril.  PMH of obesity and HTN.    Observations/Objective Review of Systems  Constitutional: Negative.   HENT: Negative.   Eyes: Negative.   Respiratory: Negative.   Cardiovascular: Negative.   Gastrointestinal: Negative.   Genitourinary: Negative.   Musculoskeletal: Positive for back pain.       Lifting a patient that had fallen no pain at that time but the next day she felt lower back pain right side than the falling day you were stiff increased pain andwent to ED for eval  Skin: Negative.   Neurological: Positive for weakness.  Endo/Heme/Allergies: Negative.   Psychiatric/Behavioral: The patient has insomnia.        Due to pain using a heating pad     Assessment and Plan: Meagan Nelson was seen today for hospitalization follow-up.  Diagnoses and all orders for this visit:  Acute right-sided low back pain without sciatica  This is a new problem. The current episode started 1  to 4 weeks ago. The problem occurs constantly. The problem has been gradually worsening since onset. The pain is present in the lumbar spine and sacro-iliac. The quality of the pain is described as aching, shooting and stabbing. The pain radiates to the right thigh. The pain is at a severity of 10/10. The pain is severe. The pain is the same all the time. The symptoms are aggravated by bending, lying down, sitting, standing, twisting and position. Stiffness is present all day. Associated symptoms include weakness. Risk factors include obesity and recent trauma. She has tried NSAIDs, muscle relaxant, bed rest and heat for the symptoms. The treatment provided mild relief.  -     Ambulatory referral to Physical Therapy  Other orders -     cyclobenzaprine (FLEXERIL) 10 MG tablet; Take 1 tablet (10 mg total) by mouth 3 (three) times daily as needed for muscle spasms.      Follow Up Instructions:    I discussed the assessment and treatment plan with the patient. The patient was provided an opportunity to ask questions and all were answered. The patient agreed with the plan and demonstrated an understanding of the instructions.   The patient was advised to call back or seek an in-person evaluation if the symptoms worsen or if the condition fails to improve as anticipated.  I provided 25 minutes of non-face-to-face time during this encounter.   Meagan Sessions, NP

## 2018-07-02 NOTE — Progress Notes (Signed)
Patient verified DOB Patient has not taken medication today. Patient needs refills. Patient has eaten today. Patient complains of pain in the right side of the back radiating down to the tail bone. Pain is scaled at a 10 currently. Pain started on 4/10 after picking a patient up off the floor. Patient requesting pain medication. Hydro caused itching and ibuprofen 600 gave no relief.

## 2018-07-03 MED FILL — IBUPROFEN 800 MG TABLET: 800 | 15 days supply | Qty: 60 | Fill #1

## 2018-07-05 ENCOUNTER — Telehealth: Payer: Self-pay | Admitting: Nurse Practitioner

## 2018-07-05 ENCOUNTER — Encounter: Payer: Self-pay | Admitting: *Deleted

## 2018-07-05 NOTE — Telephone Encounter (Signed)
Patient called to check on the status of her letter for work in regards to her back pain. Please follow up.

## 2018-07-05 NOTE — Telephone Encounter (Signed)
Will route to the provider.

## 2018-07-05 NOTE — Progress Notes (Signed)
Patient is aware of letter being placed at the front for pick up. Patient also received the number to physical therapy to schedule her appointment.

## 2018-07-07 ENCOUNTER — Other Ambulatory Visit: Payer: Self-pay

## 2018-07-07 ENCOUNTER — Ambulatory Visit: Payer: Worker's Compensation | Attending: Primary Care | Admitting: Physical Therapy

## 2018-07-07 DIAGNOSIS — M62830 Muscle spasm of back: Secondary | ICD-10-CM

## 2018-07-07 DIAGNOSIS — R2689 Other abnormalities of gait and mobility: Secondary | ICD-10-CM | POA: Insufficient documentation

## 2018-07-07 DIAGNOSIS — M5441 Lumbago with sciatica, right side: Secondary | ICD-10-CM | POA: Diagnosis not present

## 2018-07-07 NOTE — Patient Instructions (Addendum)
TENS UNIT  This is helpful for muscle pain and spasm.   Search and Purchase a TENS 7000 2nd edition at www.tenspros.com or www.amazon.com  (It should be less than $30)     TENS unit instructions:   Do not shower or bathe with the unit on  Turn the unit off before removing electrodes or batteries  If the electrodes lose stickiness add a drop of water to the electrodes after they are disconnected from the unit and place on plastic sheet. If you continued to have difficulty, call the TENS unit company to purchase more electrodes.  Do not apply lotion on the skin area prior to use. Make sure the skin is clean and dry as this will help prolong the life of the electrodes.  After use, always check skin for unusual red areas, rash or other skin difficulties. If there are any skin problems, does not apply electrodes to the same area.  Never remove the electrodes from the unit by pulling the wires.  Do not use the TENS unit or electrodes other than as directed.  Do not change electrode placement without consulting your therapist or physician.  Keep 2 fingers with between each electrode.  - Sit upright as possible  - Take deep breaths when the pain comes  - 4-5x a day 4-5x tighten your stomach as you breath  - Use the tennis ball as able (don't push too hard)

## 2018-07-07 NOTE — Addendum Note (Signed)
Addended by: Dessie Coma on: 07/07/2018 03:13 PM   Modules accepted: Orders

## 2018-07-07 NOTE — Therapy (Addendum)
[facility] [address] [place], [place], [address] Phone: [phone]   Fax:  [phone]  Physical Therapy Evaluation  Patient Details  Name: Meagan Nelson MRN: [id number] Date of Birth: 12/07/1975 Referring Provider (PT): Gwinda Passe FNP    Encounter Date: 07/07/2018  PT End of Session - 07/07/18 1456    Visit Number  1    Number of Visits  12    Date for PT Re-Evaluation  08/18/18    Authorization Type  WC     PT Start Time  1233    PT Stop Time  1335    PT Time Calculation (min)  62 min    Activity Tolerance  Patient tolerated treatment well    Behavior During Therapy  [facility] for tasks assessed/performed       Past Medical History:  Diagnosis Date  . Abscess    facial  . Anemia   . Headache   . History of blood transfusion   . Hypertension    PMH  . Insomnia     Past Surgical History:  Procedure Laterality Date  . CESAREAN SECTION     C/S x 1  . DILATATION & CURETTAGE/HYSTEROSCOPY WITH MYOSURE N/A 03/11/2017   Procedure: DILATATION & CURETTAGE/HYSTEROSCOPY WITH MYOSURE;  Surgeon: Allie Bossier, MD;  Location: [facility];  Service: Gynecology;  Laterality: N/A;  . DILATION AND CURETTAGE OF UTERUS    . TONSILLECTOMY      There were no vitals filed for this visit.   Subjective Assessment - 07/07/18 1240    Subjective  On 06/23/2018 the patient was bendingdown to help a patient off the groundwhen she felt a pull in her back. By the next day her back was hiurting so bad she had a hard time walking. The pain is on the right side and goes into the right buttock.     Limitations  Sitting;Lifting;Standing    How long can you sit comfortably?  < 5 minutes without shifting     How long can you stand comfortably?  < 5 min without chnaging position     How long can you walk comfortably?  community distances cause increased pain     Diagnostic tests  nothing of the lumbar spine in the computer      Patient Stated Goals  to have less pain/ To be able to sleep    Pain Type  Acute pain    Pain Onset  1 to 4 weeks ago    Pain Frequency  Constant    Multiple Pain Sites  Yes    Pain Score  7    Pain Location  Back    Pain Orientation  Right    Pain Descriptors / Indicators  Aching    Pain Type  Chronic pain    Pain Onset  More than a month ago    Pain Frequency  Constant    Aggravating Factors   Sitting, standing,     Pain Relieving Factors  heating pad     Effect of Pain on Daily Activities  diffuclty sitting, standing and walking         [facility] PT Assessment - 07/07/18 0001      Assessment   Medical Diagnosis  Low bback pain with right radiculopathy     Referring Provider (PT)  Gwinda Passe FNP     Onset Date/Surgical Date  06/23/18    Hand Dominance  Right    Next MD Visit  Nothin Scheduled     Prior Therapy  None       Precautions   Precautions  None      Restrictions   Weight Bearing Restrictions  No      Balance Screen   Has the patient fallen in the past 6 months  No    Has the patient had a decrease in activity level because of a fear of falling?   No    Is the patient reluctant to leave their home because of a fear of falling?   No      Home Environment   Additional Comments  5 steps going into the house       Prior Function   Level of Independence  Independent      Cognition   Overall Cognitive Status  Within Functional Limits for tasks assessed    Attention  Focused    Focused Attention  Appears intact    Memory  Appears intact    Awareness  Appears intact    Problem Solving  Appears intact      Observation/Other Assessments   Observations  Frequnetly shifts in her chair       Sensation   Additional Comments  Denies Parathesias       Coordination   Gross Motor Movements are Fluid and Coordinated  Yes    Fine Motor Movements are Fluid and Coordinated  Yes      Posture/Postural Control   Posture Comments  sits and stands with slightly  flexed trunk       ROM / Strength   AROM / PROM / Strength  AROM;PROM;Strength      AROM   Overall AROM Comments  unable to assess hips because she can not get into supine or semi-recumbent;    AROM Assessment Site  Lumbar    Lumbar Flexion  10 degrees with significant increase in radicular symptoms     Lumbar Extension  2 with significant increase in pain     Lumbar - Right Rotation  significant increase in pain     Lumbar - Left Rotation  mild increase in pain       Strength   Strength Assessment Site  Hip;Knee    Right/Left Hip  Right;Left    Right Hip Flexion  2+/5    Right Hip ABduction  3+/5    Right Hip ADduction  4/5    Left Hip Flexion  4/5    Left Hip ABduction  4/5    Left Hip ADduction  4/5      Palpation   Palpation comment  singiifcant tendenress to palkpation and spamsing in the right paraspiunals and into the right gluteals       Special Tests   Other special tests  unable to perfrom 2nd to positioning and significant pain       Bed Mobility   Bed Mobility  --   Pain with bed mobility      Transfers   Comments  slow transfer from sit to stand                 Objective measurements completed on examination: See above findings.      [facility] Adult PT Treatment/Exercise - 07/07/18 0001      Exercises   Exercises  Lumbar      Modalities   Modalities  Electrical Stimulation;Moist Heat      Moist Heat Therapy   Number Minutes Moist Heat  10 Minutes  Moist Heat Location  Lumbar Spine      Electrical Stimulation   Electrical Stimulation Location  to lumbar spine     Electrical Stimulation Action  to reduce spasming     Electrical Stimulation Parameters  to tolerance     Electrical Stimulation Goals  Pain      Manual Therapy   Manual Therapy  Soft tissue mobilization    Soft tissue mobilization  Perfromed sdelying lumbar rock; soft tissue mobilization to the lumbar paraspinals ,QL and gluteals, reviewed how to perfrom individually              PT Education - 07/07/18 1456    Education Details  reviewed symptom management and expected progression     Person(s) Educated  Patient    Methods  Explanation;Demonstration;Tactile cues;Verbal cues    Comprehension  Verbalized understanding;Returned demonstration;Verbal cues required;Tactile cues required       PT Short Term Goals - 07/07/18 1437      PT SHORT TERM GOAL #1   Title  Patient will increase lumbar flexion by 20 degrees without radicular pain     Time  3    Period  Weeks    Status  New    Target Date  08/04/18      PT SHORT TERM GOAL #2   Title  Patient will increase right gross leg strength to 4+/5     Time  3    Period  Weeks    Status  New    Target Date  08/04/18      PT SHORT TERM GOAL #3   Title  Patient will report 2/10 pain at worst without radicualr symptoms into her right leg     Time  3    Period  Weeks    Status  New    Target Date  07/28/18        PT Long Term Goals - 07/07/18 1440      PT LONG TERM GOAL #1   Title  Patient will bend down and pick item off the floor without increased radicular symptoms in order to return to work activity     Time  6    Period  Weeks    Status  New    Target Date  08/18/18      PT LONG TERM GOAL #2   Title  Patient will stand for 1 hour without reports of increased radicular pain in order to perfrom work activity     Time  6    Period  Weeks    Status  New    Target Date  08/18/18      PT LONG TERM GOAL #3   Title  Patient will sleep thorught the night without increased pain     Time  6    Period  Weeks    Status  New    Target Date  08/18/18             Plan - 07/07/18 1442    Clinical Impression Statement  Patient is a 43 year old female with significant radicualr symptoms into her right buttock following a job related back injury while working. She has no self reported history of lower back problems. She has significant limitations in right leg strength, lumbar motion and  positional tolerance. She is having difficulty sleeping at night. She had increased radicular symptoms driving to the clinic today. She would benefit from skilled therapy to centralize pain, increase strength, and improve  ability to perfrom work functions.     Personal Factors and Comorbidities  Fitness;Comorbidity 1    Comorbidities  obesity     Examination-Activity Limitations  Bend;Lift;Locomotion Level;Sit;Squat;Stairs;Stand;Bed Mobility    Examination-Participation Restrictions  Cleaning;Laundry;Personal Finances;Meal Prep;Driving    Stability/Clinical Decision Making  Evolving/Moderate complexity    Clinical Decision Making  Moderate    Rehab Potential  Good    PT Frequency  2x / week    PT Duration  6 weeks    PT Treatment/Interventions  ADLs/Self Care Home Management;Cryotherapy;Electrical Stimulation;Moist Heat;Traction;Ultrasound;Gait training;DME Instruction;Therapeutic exercise;Therapeutic activities;Neuromuscular re-education;Patient/family education;Manual techniques;Passive range of motion;Dry needling;Splinting;Taping    PT Next Visit Plan  continue with manual therapy to decrease radicular symptoms. Patient was not able to lye supine or semirecumbent. Attmempt LAD of oposite leg if able. Consder soft tissue mobility to lewer back; continue with e-stim; begin core stability when able; Eventual progrssion into Fleming positioning if indicated with high levels of pain decrease. continue with modalities PRN.     PT Home Exercise Plan  just advised patient to correct posture as abel, take deep breaths, reviewed where to get a TENS unit;     Consulted and Agree with Plan of Care  Patient       Patient will benefit from skilled therapeutic intervention in order to improve the following deficits and impairments:  Abnormal gait, Decreased activity tolerance, Decreased range of motion, Difficulty walking, Decreased endurance, Obesity, Improper body mechanics, Decreased strength  Visit  Diagnosis: Acute right-sided low back pain with right-sided sciatica  Muscle spasm of back  Other abnormalities of gait and mobility     Problem List Patient Active Problem List   Diagnosis Date Noted  . Morbid obesity with BMI of 40.0-44.9, adult (HCC) 03/26/2018  . Essential hypertension 02/17/2017  . Abnormal uterine bleeding 01/19/2017  . Cellulitis of face   . Symptomatic anemia 09/01/2014  . Microcytic hypochromic anemia 09/01/2014  . Dental infection 09/01/2014    Dessie Coma PT DPT  07/07/2018, 3:12 PM  [facility] [address] [place], [place], [address] Phone: [phone]   Fax:  [phone]  Name: Meagan Nelson MRN: [id number] Date of Birth: 28-Sep-1975

## 2018-07-08 ENCOUNTER — Ambulatory Visit: Payer: Worker's Compensation | Admitting: Physical Therapy

## 2018-07-13 ENCOUNTER — Ambulatory Visit: Payer: Worker's Compensation | Admitting: Physical Therapy

## 2018-07-13 ENCOUNTER — Other Ambulatory Visit: Payer: Self-pay

## 2018-07-13 DIAGNOSIS — M5441 Lumbago with sciatica, right side: Secondary | ICD-10-CM

## 2018-07-13 DIAGNOSIS — R2689 Other abnormalities of gait and mobility: Secondary | ICD-10-CM

## 2018-07-13 DIAGNOSIS — M62830 Muscle spasm of back: Secondary | ICD-10-CM

## 2018-07-14 ENCOUNTER — Encounter: Payer: Self-pay | Admitting: Physical Therapy

## 2018-07-14 NOTE — Therapy (Signed)
[facility] [address] [place], [place], [address] Phone: [phone]   Fax:  [phone]  Physical Therapy Treatment  Patient Details  Name: Meagan Nelson MRN: [id number] Date of Birth: Mar 10, 1976 Referring Provider (PT): Gwinda Passe FNP    Encounter Date: 07/13/2018  PT End of Session - 07/14/18 1042    Visit Number  2    Number of Visits  12    Date for PT Re-Evaluation  08/18/18    Authorization Type  WC     PT Start Time  1445    PT Stop Time  1542    PT Time Calculation (min)  57 min    Activity Tolerance  Patient tolerated treatment well    Behavior During Therapy  [facility] for tasks assessed/performed       Past Medical History:  Diagnosis Date  . Abscess    facial  . Anemia   . Headache   . History of blood transfusion   . Hypertension    PMH  . Insomnia     Past Surgical History:  Procedure Laterality Date  . CESAREAN SECTION     C/S x 1  . DILATATION & CURETTAGE/HYSTEROSCOPY WITH MYOSURE N/A 03/11/2017   Procedure: DILATATION & CURETTAGE/HYSTEROSCOPY WITH MYOSURE;  Surgeon: Allie Bossier, MD;  Location: [facility];  Service: Gynecology;  Laterality: N/A;  . DILATION AND CURETTAGE OF UTERUS    . TONSILLECTOMY      There were no vitals filed for this visit.  Subjective Assessment - 07/14/18 1028    Subjective  Patient reports her back has been much better. She has been working on the trigger points with her tennis ball. She is no longer havin radiuclar symptoms.     Limitations  Sitting;Lifting;Standing    How long can you sit comfortably?  < 5 minutes without shifting     How long can you stand comfortably?  < 5 min without chnaging position     How long can you walk comfortably?  community distances cause increased pain     Diagnostic tests  nothing of the lumbar spine in the computer     Patient Stated Goals  to have less pain/ To be able to sleep    Currently in Pain?  Yes    Pain  Score  5     Pain Location  Back    Pain Orientation  Right    Pain Descriptors / Indicators  Aching    Pain Type  Acute pain    Pain Onset  More than a month ago    Pain Frequency  Constant    Aggravating Factors   Standing, walking     Pain Relieving Factors  rest, heat, tennis ball     Effect of Pain on Daily Activities  difficulty bending to pickthings up                        [facility] Adult PT Treatment/Exercise - 07/14/18 0001      Lumbar Exercises: Stretches   Active Hamstring Stretch Limitations  seated 2x10     Other Lumbar Stretch Exercise  attmpted piriformis stretch with towel; Patient required max cuing for technique. Unclear wether patient had any benefit. Patient advised to only do if it helpoed       Lumbar Exercises: Seated   Other Seated Lumbar Exercises  ball squeeze with breathing mod a; Hip abduction red band x10 mod cuing  Lumbar Exercises: Supine   AB Set Limitations  mod cuing for breathing x10       Modalities   Modalities  Electrical Stimulation;Moist Heat      Moist Heat Therapy   Number Minutes Moist Heat  10 Minutes    Moist Heat Location  Lumbar Spine      Electrical Stimulation   Electrical Stimulation Location  to lumbar spine     Electrical Stimulation Action  to reduce spasm     Electrical Stimulation Parameters  to tolearance     Electrical Stimulation Goals  Pain      Manual Therapy   Soft tissue mobilization  sidelying rokcing mobilization; IATYM to lumbar spine in sidelying; attempted LAD in supine with no benefit;              PT Education - 07/14/18 1041    Education Details  reviewed new HEP and symptom management     Person(s) Educated  Patient    Methods  Explanation;Demonstration;Tactile cues;Verbal cues    Comprehension  Verbal cues required;Tactile cues required       PT Short Term Goals - 07/14/18 1044      PT SHORT TERM GOAL #1   Title  Patient will increase lumbar flexion by 20 degrees  without radicular pain     Time  3    Period  Weeks    Status  On-going      PT SHORT TERM GOAL #2   Title  Patient will increase right gross leg strength to 4+/5     Time  3    Period  Weeks    Status  On-going      PT SHORT TERM GOAL #3   Title  Patient will report 2/10 pain at worst without radicualr symptoms into her right leg     Time  3    Period  Weeks    Status  On-going        PT Long Term Goals - 07/07/18 1440      PT LONG TERM GOAL #1   Title  Patient will bend down and pick item off the floor without increased radicular symptoms in order to return to work activity     Time  6    Period  Weeks    Status  New    Target Date  08/18/18      PT LONG TERM GOAL #2   Title  Patient will stand for 1 hour without reports of increased radicular pain in order to perfrom work activity     Time  6    Period  Weeks    Status  New    Target Date  08/18/18      PT LONG TERM GOAL #3   Title  Patient will sleep thorught the night without increased pain     Time  6    Period  Weeks    Status  New    Target Date  08/18/18            Plan - 07/14/18 1043    Clinical Impression Statement  Patient has improved. She is still having difficulty lying supine but she was able yto get into the position. She was given further ther-ex to work on. She was adivsed if they increase her pain to stick with what she is doing.     Examination-Activity Limitations  Bend;Lift;Locomotion Level;Sit;Squat;Stairs;Stand;Bed Mobility    Examination-Participation Restrictions  Cleaning;Laundry;Personal Finances;Meal Prep;Driving  Stability/Clinical Decision Making  Evolving/Moderate complexity    Clinical Decision Making  Moderate    Rehab Potential  Good    PT Frequency  2x / week    PT Duration  6 weeks    PT Treatment/Interventions  ADLs/Self Care Home Management;Cryotherapy;Electrical Stimulation;Moist Heat;Traction;Ultrasound;Gait training;DME Instruction;Therapeutic  exercise;Therapeutic activities;Neuromuscular re-education;Patient/family education;Manual techniques;Passive range of motion;Dry needling;Splinting;Taping    PT Next Visit Plan  continue with manual therapy to decrease radicular symptoms. Patient was not able to lye supine or semirecumbent. Attmempt LAD of oposite leg if able. Consder soft tissue mobility to lewer back; continue with e-stim; begin core stability when able; Eventual progrssion into [place] positioning if indicated with high levels of pain decrease. continue with modalities PRN.     PT Home Exercise Plan  just advised patient to correct posture as abel, take deep breaths, reviewed where to get a TENS unit;     Consulted and Agree with Plan of Care  Patient       Patient will benefit from skilled therapeutic intervention in order to improve the following deficits and impairments:  Abnormal gait, Decreased activity tolerance, Decreased range of motion, Difficulty walking, Decreased endurance, Obesity, Improper body mechanics, Decreased strength  Visit Diagnosis: Acute right-sided low back pain with right-sided sciatica  Muscle spasm of back  Other abnormalities of gait and mobility     Problem List Patient Active Problem List   Diagnosis Date Noted  . Morbid obesity with BMI of 40.0-44.9, adult (HCC) 03/26/2018  . Essential hypertension 02/17/2017  . Abnormal uterine bleeding 01/19/2017  . Cellulitis of face   . Symptomatic anemia 09/01/2014  . Microcytic hypochromic anemia 09/01/2014  . Dental infection 09/01/2014    Dessie Comaavid J Syona Wroblewski PT DPT  07/14/2018, 10:45 AM  [facility] [address] [place], [place], [address] Phone: [phone]   Fax:  [phone]  Name: Lupita RaiderKeshia Colao MRN: [id number] Date of Birth: 02/25/1976

## 2018-07-15 ENCOUNTER — Ambulatory Visit: Payer: Worker's Compensation | Admitting: Physical Therapy

## 2018-07-15 ENCOUNTER — Encounter: Payer: Self-pay | Admitting: Physical Therapy

## 2018-07-15 ENCOUNTER — Other Ambulatory Visit: Payer: Self-pay

## 2018-07-15 DIAGNOSIS — R2689 Other abnormalities of gait and mobility: Secondary | ICD-10-CM

## 2018-07-15 DIAGNOSIS — M5441 Lumbago with sciatica, right side: Secondary | ICD-10-CM | POA: Diagnosis not present

## 2018-07-15 DIAGNOSIS — M62830 Muscle spasm of back: Secondary | ICD-10-CM

## 2018-07-15 NOTE — Therapy (Signed)
[facility] [address] [place], [place], [address] Phone: [phone]   Fax:  [phone]  Physical Therapy Treatment  Patient Details  Name: Meagan Nelson MRN: [id number] Date of Birth: 10-05-1975 Referring Provider (PT): Gwinda Passe FNP    Encounter Date: 07/15/2018  PT End of Session - 07/15/18 1612    Visit Number  3    Number of Visits  12    Date for PT Re-Evaluation  08/18/18    Authorization Type  WC     PT Start Time  1230    PT Stop Time  1327    PT Time Calculation (min)  57 min    Activity Tolerance  Patient tolerated treatment well    Behavior During Therapy  [facility] for tasks assessed/performed       Past Medical History:  Diagnosis Date  . Abscess    facial  . Anemia   . Headache   . History of blood transfusion   . Hypertension    PMH  . Insomnia     Past Surgical History:  Procedure Laterality Date  . CESAREAN SECTION     C/S x 1  . DILATATION & CURETTAGE/HYSTEROSCOPY WITH MYOSURE N/A 03/11/2017   Procedure: DILATATION & CURETTAGE/HYSTEROSCOPY WITH MYOSURE;  Surgeon: Allie Bossier, MD;  Location: [facility];  Service: Gynecology;  Laterality: N/A;  . DILATION AND CURETTAGE OF UTERUS    . TONSILLECTOMY      There were no vitals filed for this visit.  Subjective Assessment - 07/15/18 1410    Subjective  Patient reports her back has been feeling better. She reports she tried to ie down and do exercises on her Bo-flex which     Limitations  Sitting;Lifting;Standing    How long can you sit comfortably?  < 5 minutes without shifting     How long can you stand comfortably?  < 5 min without chnaging position     How long can you walk comfortably?  community distances cause increased pain     Diagnostic tests  nothing of the lumbar spine in the computer     Patient Stated Goals  to have less pain/ To be able to sleep    Currently in Pain?  Yes    Pain Score  3     Pain Location  Back     Pain Orientation  Right    Pain Descriptors / Indicators  Aching    Pain Type  Acute pain    Pain Onset  More than a month ago    Pain Frequency  Constant    Aggravating Factors   standing, walking     Pain Relieving Factors  rest, heat, tennis ball    Effect of Pain on Daily Activities  difficulty bending and picking tuff up                        [facility] Adult PT Treatment/Exercise - 07/15/18 0001      Lumbar Exercises: Standing   Row Limitations  2x10 yellow with cuing for posture and breathing     Shoulder Extension Limitations  2x10 yellow with cuing for breathing       Lumbar Exercises: Seated   Other Seated Lumbar Exercises  ball squeeze with breathing mod a; Hip abduction red band x10 mod cuing       Modalities   Modalities  Electrical Stimulation;Moist Heat      Moist Heat  Therapy   Moist Heat Location  Lumbar Spine      Electrical Stimulation   Electrical Stimulation Location  to lumbar spine     Electrical Stimulation Action  IFC     Electrical Stimulation Parameters  to tolerance     Electrical Stimulation Goals  Pain      Manual Therapy   Soft tissue mobilization  sidelying rocking mobilization; IATYM to lumbar spine in sidelying; Patient again unable to lie on the back for mobilization              PT Education - 07/15/18 1611    Education Details  reviewed HEP, syptom mangement; improtance of a progression of activity     Person(s) Educated  Patient    Methods  Explanation;Demonstration;Tactile cues;Verbal cues    Comprehension  Verbalized understanding;Returned demonstration;Verbal cues required;Tactile cues required       PT Short Term Goals - 07/14/18 1044      PT SHORT TERM GOAL #1   Title  Patient will increase lumbar flexion by 20 degrees without radicular pain     Time  3    Period  Weeks    Status  On-going      PT SHORT TERM GOAL #2   Title  Patient will increase right gross leg strength to 4+/5     Time  3     Period  Weeks    Status  On-going      PT SHORT TERM GOAL #3   Title  Patient will report 2/10 pain at worst without radicualr symptoms into her right leg     Time  3    Period  Weeks    Status  On-going        PT Long Term Goals - 07/07/18 1440      PT LONG TERM GOAL #1   Title  Patient will bend down and pick item off the floor without increased radicular symptoms in order to return to work activity     Time  6    Period  Weeks    Status  New    Target Date  08/18/18      PT LONG TERM GOAL #2   Title  Patient will stand for 1 hour without reports of increased radicular pain in order to perfrom work activity     Time  6    Period  Weeks    Status  New    Target Date  08/18/18      PT LONG TERM GOAL #3   Title  Patient will sleep thorught the night without increased pain     Time  6    Period  Weeks    Status  New    Target Date  08/18/18            Plan - 07/15/18 1519    Clinical Impression Statement  Patient is feeling much better but therapy educated her that she still has owrk to do. She still can not lie on her back. Therapy also attmpted mini-suqts for returen to work and she had a significant increase in pain. Therapy gave her 2 more exercises she can perfrom at home. She was advised not to do the bo-flex for now. She had less sapsming today but continues to have significant trigger points. Consdier trying prone progression next viisit if she can get on her stomach but she may not be able to co. Continue to peogress fucntional activity.  Examination-Activity Limitations  Bend;Lift;Locomotion Level;Sit;Squat;Stairs;Stand;Bed Mobility    Examination-Participation Restrictions  Cleaning;Laundry;Personal Finances;Meal Prep;Driving    Stability/Clinical Decision Making  Evolving/Moderate complexity    Clinical Decision Making  Moderate    Rehab Potential  Good    PT Frequency  2x / week    PT Duration  6 weeks    PT Treatment/Interventions  ADLs/Self Care Home  Management;Cryotherapy;Electrical Stimulation;Moist Heat;Traction;Ultrasound;Gait training;DME Instruction;Therapeutic exercise;Therapeutic activities;Neuromuscular re-education;Patient/family education;Manual techniques;Passive range of motion;Dry needling;Splinting;Taping    PT Next Visit Plan  continue with manual therapy to decrease radicular symptoms. Patient was not able to lye supine or semirecumbent. Attmempt LAD of oposite leg if able. Consder soft tissue mobility to lewer back; continue with e-stim; begin core stability when able; Eventual progrssion into [place] positioning if indicated with high levels of pain decrease. continue with modalities PRN.     PT Home Exercise Plan  just advised patient to correct posture as abel, take deep breaths, reviewed where to get a TENS unit;     Consulted and Agree with Plan of Care  Patient       Patient will benefit from skilled therapeutic intervention in order to improve the following deficits and impairments:  Abnormal gait, Decreased activity tolerance, Decreased range of motion, Difficulty walking, Decreased endurance, Obesity, Improper body mechanics, Decreased strength  Visit Diagnosis: Acute right-sided low back pain with right-sided sciatica  Muscle spasm of back  Other abnormalities of gait and mobility     Problem List Patient Active Problem List   Diagnosis Date Noted  . Morbid obesity with BMI of 40.0-44.9, adult (HCC) 03/26/2018  . Essential hypertension 02/17/2017  . Abnormal uterine bleeding 01/19/2017  . Cellulitis of face   . Symptomatic anemia 09/01/2014  . Microcytic hypochromic anemia 09/01/2014  . Dental infection 09/01/2014    Dessie Coma  PT DPT  07/15/2018, 4:17 PM  [facility] [address] [place], [place], [address] Phone: [phone]   Fax:  [phone]  Name: Meagan Nelson MRN: [id number] Date of Birth: 1975/08/02

## 2018-07-20 ENCOUNTER — Other Ambulatory Visit: Payer: Self-pay

## 2018-07-20 ENCOUNTER — Ambulatory Visit: Payer: Worker's Compensation | Attending: Primary Care | Admitting: Physical Therapy

## 2018-07-20 ENCOUNTER — Encounter: Payer: Self-pay | Admitting: Physical Therapy

## 2018-07-20 DIAGNOSIS — M5441 Lumbago with sciatica, right side: Secondary | ICD-10-CM | POA: Diagnosis not present

## 2018-07-20 DIAGNOSIS — R2689 Other abnormalities of gait and mobility: Secondary | ICD-10-CM | POA: Diagnosis present

## 2018-07-20 DIAGNOSIS — M62830 Muscle spasm of back: Secondary | ICD-10-CM | POA: Insufficient documentation

## 2018-07-20 NOTE — Patient Instructions (Signed)
TENS stands for Transcutaneous Electrical Nerve Stimulation. In other words, electrical impulses are allowed to pass through the skin in order to excite a nerve.   Purpose and Use of TENS:  TENS is a method used to manage acute and chronic pain without the use of drugs. It has been effective in managing pain associated with surgery, sprains, strains, trauma, rheumatoid arthritis, and neuralgias. It is a non-addictive, low risk, and non-invasive technique used to control pain. It is not, by any means, a curative form of treatment.   How TENS Works:  Most TENS units are a small pocket-sized unit powered by one 9 volt battery. Attached to the outside of the unit are two lead wires where two pins and/or snaps connect on each wire. All units come with a set of four reusable pads or electrodes. These are placed on the skin surrounding the area involved. By inserting the leads into  the pads, the electricity can pass from the unit making the circuit complete.  As the intensity is turned up slowly, the electrical current enters the body from the electrodes through the skin to the surrounding nerve fibers. This triggers the release of hormones from within the body. These hormones contain pain relievers. By increasing the circulation of these hormones, the person's pain may be lessened. It is also believed that the electrical stimulation itself helps to block the pain messages being sent to the brain, thus also decreasing the body's perception of pain.   Hazards:  TENS units are NOT to be used by patients with PACEMAKERS, DEFIBRILLATORS, DIABETIC PUMPS, PREGNANT WOMEN, and patients with SEIZURE DISORDERS.  TENS units are NOT to be used over the heart, throat, brain, or spinal cord.  One of the major side effects from the TENS unit may be skin irritation. Some people may develop a rash if they are sensitive to the materials used in the electrodes or the connecting wires.     Avoid overuse due the body getting  used to the stem making it not as effective over time.   TENS UNIT  This is helpful for muscle pain and spasm.   Search and Purchase a TENS 7000 2nd edition at www.tenspros.com or www.amazon.com  (It should be less than $30)     TENS unit instructions:   Do not shower or bathe with the unit on  Turn the unit off before removing electrodes or batteries  If the electrodes lose stickiness add a drop of water to the electrodes after they are disconnected from the unit and place on plastic sheet. If you continued to have difficulty, call the TENS unit company to purchase more electrodes.  Do not apply lotion on the skin area prior to use. Make sure the skin is clean and dry as this will help prolong the life of the electrodes.  After use, always check skin for unusual red areas, rash or other skin difficulties. If there are any skin problems, does not apply electrodes to the same area.  Never remove the electrodes from the unit by pulling the wires.  Do not use the TENS unit or electrodes other than as directed.  Do not change electrode placement without consulting your therapist or physician.  Keep 2 fingers with between each electrode.  

## 2018-07-20 NOTE — Therapy (Signed)
[facility] [address] [place], [place], [address] Phone: [phone]   Fax:  [phone]  Physical Therapy Treatment  Patient Details  Name: Meagan Nelson MRN: [id number] Date of Birth: 05-28-1975 Referring Provider (PT): Gwinda Passe FNP    Encounter Date: 07/20/2018  PT End of Session - 07/20/18 1153    Visit Number  4    Number of Visits  12    Date for PT Re-Evaluation  08/18/18    Authorization Type  WC     PT Start Time  1145    PT Stop Time  1242    PT Time Calculation (min)  57 min    Activity Tolerance  Patient tolerated treatment well    Behavior During Therapy  [facility] for tasks assessed/performed       Past Medical History:  Diagnosis Date  . Abscess    facial  . Anemia   . Headache   . History of blood transfusion   . Hypertension    PMH  . Insomnia     Past Surgical History:  Procedure Laterality Date  . CESAREAN SECTION     C/S x 1  . DILATATION & CURETTAGE/HYSTEROSCOPY WITH MYOSURE N/A 03/11/2017   Procedure: DILATATION & CURETTAGE/HYSTEROSCOPY WITH MYOSURE;  Surgeon: Allie Bossier, MD;  Location: [facility];  Service: Gynecology;  Laterality: N/A;  . DILATION AND CURETTAGE OF UTERUS    . TONSILLECTOMY      There were no vitals filed for this visit.  Subjective Assessment - 07/20/18 1150    Subjective  Patient hurt her knee lifting boxes over the weekend. Her back has been feeling better. She has not used any of her muslce relaxers. She feels like her kneeis heavey.     Limitations  Sitting;Lifting;Standing    How long can you sit comfortably?  < 5 minutes without shifting     How long can you stand comfortably?  < 5 min without chnaging position     How long can you walk comfortably?  community distances cause increased pain     Diagnostic tests  nothing of the lumbar spine in the computer     Patient Stated Goals  to have less pain/ To be able to sleep    Currently in Pain?   Yes    Pain Score  3     Pain Location  Back    Pain Orientation  Right    Pain Descriptors / Indicators  Aching    Pain Type  Acute pain    Pain Onset  More than a month ago    Pain Frequency  Constant    Aggravating Factors   standing and walking     Pain Relieving Factors  rest, heat, tennis ball     Effect of Pain on Daily Activities  bending to pick things up                        [facility] Adult PT Treatment/Exercise - 07/20/18 0001      Self-Care   Self-Care  Other Self-Care Comments;RICE    RICE  reviewed RICe for patients knee; Patients knee is likle effecting hoi she si walking and her back;    Other Self-Care Comments   Reviewed where to buy a tens unit       Lumbar Exercises: Stretches   Piriformis Stretch Limitations  3x15 sec hold in supine      Lumbar  Exercises: Aerobic   Nustep  2x10       Lumbar Exercises: Standing   Other Standing Lumbar Exercises  standing hip abduction x10 bilateral; Extension bilateral x10     Other Standing Lumbar Exercises  standing march       Moist Heat Therapy   Moist Heat Location  Lumbar Spine      Electrical Stimulation   Electrical Stimulation Location  to lumbar spine     Electrical Stimulation Action  IFC    Electrical Stimulation Parameters  to tolerance     Electrical Stimulation Goals  Pain      Manual Therapy   Manual therapy comments  supine LAD 5x30 sec hold; PA glides of the hip to increase flexion; Prone PA glides of L4 and L5    Soft tissue mobilization  sidelying rocking mobilization; IATYM to lumbar spine in sidelying; Patient again unable to lie on the back for mobilization              PT Education - 07/20/18 1152    Education Details  reviewed light LE activity to help knee and back     Person(s) Educated  Patient    Methods  Explanation;Demonstration;Tactile cues;Verbal cues    Comprehension  Returned demonstration;Verbal cues required;Tactile cues required;Verbalized understanding        PT Short Term Goals - 07/20/18 1401      PT SHORT TERM GOAL #1   Title  Patient will increase lumbar flexion by 20 degrees without radicular pain     Baseline  not assessed     Time  3    Period  Weeks    Status  On-going    Target Date  08/04/18      PT SHORT TERM GOAL #2   Title  Patient will increase right gross leg strength to 4+/5     Time  3    Period  Weeks    Status  On-going    Target Date  08/04/18      PT SHORT TERM GOAL #3   Title  Patient will report 2/10 pain at worst without radicualr symptoms into her right leg     Time  3    Period  Weeks    Status  On-going    Target Date  07/28/18        PT Long Term Goals - 07/07/18 1440      PT LONG TERM GOAL #1   Title  Patient will bend down and pick item off the floor without increased radicular symptoms in order to return to work activity     Time  6    Period  Weeks    Status  New    Target Date  08/18/18      PT LONG TERM GOAL #2   Title  Patient will stand for 1 hour without reports of increased radicular pain in order to perfrom work activity     Time  6    Period  Weeks    Status  New    Target Date  08/18/18      PT LONG TERM GOAL #3   Title  Patient will sleep thorught the night without increased pain     Time  6    Period  Weeks    Status  New    Target Date  08/18/18            Plan - 07/20/18 1358    Clinical Impression  Statement  Patient was able to lie supine today. She needed a pillow under her back but therapy was able to perfrom LAD and posterior hip mobilization. Therapy also reviewed standing exercises. She was advised to schedule next week but at this time does not have visits scheduled. She was also educated on where to find her own TENS unti. She is making steady progress but her back is still reactive to positioning. Therapy also educated her on RICE for  knee swelling.     Personal Factors and Comorbidities  Fitness;Comorbidity 1    Comorbidities  obesity      Examination-Activity Limitations  Bend;Lift;Locomotion Level;Sit;Squat;Stairs;Stand;Bed Mobility    Examination-Participation Restrictions  Cleaning;Laundry;Personal Finances;Meal Prep;Driving    Stability/Clinical Decision Making  Evolving/Moderate complexity    Rehab Potential  Good    PT Frequency  2x / week    PT Duration  6 weeks    PT Treatment/Interventions  ADLs/Self Care Home Management;Cryotherapy;Electrical Stimulation;Moist Heat;Traction;Ultrasound;Gait training;DME Instruction;Therapeutic exercise;Therapeutic activities;Neuromuscular re-education;Patient/family education;Manual techniques;Passive range of motion;Dry needling;Splinting;Taping    PT Next Visit Plan  continue with manual therapy. Continue with light core and hip strengthneing.     PT Home Exercise Plan  just advised patient to correct posture as abel, take deep breaths, reviewed where to get a TENS unit;     Consulted and Agree with Plan of Care  Patient       Patient will benefit from skilled therapeutic intervention in order to improve the following deficits and impairments:  Abnormal gait, Decreased activity tolerance, Decreased range of motion, Difficulty walking, Decreased endurance, Obesity, Improper body mechanics, Decreased strength  Visit Diagnosis: Acute right-sided low back pain with right-sided sciatica  Muscle spasm of back  Other abnormalities of gait and mobility     Problem List Patient Active Problem List   Diagnosis Date Noted  . Morbid obesity with BMI of 40.0-44.9, adult (HCC) 03/26/2018  . Essential hypertension 02/17/2017  . Abnormal uterine bleeding 01/19/2017  . Cellulitis of face   . Symptomatic anemia 09/01/2014  . Microcytic hypochromic anemia 09/01/2014  . Dental infection 09/01/2014    Dessie Coma PT DPT  07/20/2018, 4:42 PM  [facility] [address] [place], [place], [address] Phone: [phone]   Fax:   [phone]  Name: Meagan Nelson MRN: [id number] Date of Birth: 29-Jul-1975

## 2018-07-22 ENCOUNTER — Telehealth: Payer: Self-pay | Admitting: Nurse Practitioner

## 2018-07-22 NOTE — Telephone Encounter (Signed)
New Message  Pt is calling wanting to know if she can get a note to return to work on Monday. She says she has completed all of her physical therapy and is no longer on muscle relaxer's. Please f/u

## 2018-07-23 NOTE — Telephone Encounter (Signed)
At this time physical therapy has not stated she is able to return to work. Please advise if she has been released by physical therapy and if so they will need to call me or document in the system.

## 2018-07-23 NOTE — Telephone Encounter (Signed)
Pt is calling wanting to know if she can get a note to return to work on Monday. Please f/u

## 2018-07-23 NOTE — Telephone Encounter (Signed)
CMA spoke to patient to inform her physical therapy need to call or put in the system stated she can be released back to work. Pt. Understood.

## 2018-07-26 ENCOUNTER — Telehealth: Payer: Self-pay | Admitting: Physical Therapy

## 2018-07-26 NOTE — Telephone Encounter (Signed)
Patient called our office reporting that Theodoro Grist was supposed to release her so her MD would write her back to work. Theodoro Grist is off this week and in reviewing the note, he recommended patient return for another visit this week and no specific recommendations regarding return to work were noted. Patient was scheduled for [facility] 5/14. Patient agreed to move appointment up to tomorrow so that another can re-evaluate status and make recommendations to MD re: return to work. Patient is hoping to return to work on Monday 5/18.  Percell Boston, PT, DPT 07/26/18 2:19 PM Phone: [phone] Fax: [phone]

## 2018-07-27 ENCOUNTER — Other Ambulatory Visit: Payer: Self-pay

## 2018-07-27 ENCOUNTER — Encounter: Payer: Self-pay | Admitting: Physical Therapy

## 2018-07-27 ENCOUNTER — Ambulatory Visit: Payer: Worker's Compensation | Admitting: Physical Therapy

## 2018-07-27 DIAGNOSIS — M5441 Lumbago with sciatica, right side: Secondary | ICD-10-CM

## 2018-07-27 DIAGNOSIS — M62830 Muscle spasm of back: Secondary | ICD-10-CM

## 2018-07-27 DIAGNOSIS — R2689 Other abnormalities of gait and mobility: Secondary | ICD-10-CM

## 2018-07-27 NOTE — Therapy (Signed)
[facility] [address] [place], [place], [address] Phone: [phone]   Fax:  [phone]  Physical Therapy Treatment  Patient Details  Name: Meagan RaiderKeshia Nelson MRN: [id number] Date of Birth: 03/13/1976 Referring Provider (PT): Gwinda PasseMichelle Edwards FNP    Encounter Date: 07/27/2018  PT End of Session - 07/27/18 0804    Visit Number  5    Number of Visits  12    Date for PT Re-Evaluation  08/18/18    Authorization Type  WC     PT Start Time  0800    PT Stop Time  0842    PT Time Calculation (min)  42 min    Activity Tolerance  Patient tolerated treatment well    Behavior During Therapy  [facility] for tasks assessed/performed       Past Medical History:  Diagnosis Date  . Abscess    facial  . Anemia   . Headache   . History of blood transfusion   . Hypertension    PMH  . Insomnia     Past Surgical History:  Procedure Laterality Date  . CESAREAN SECTION     C/S x 1  . DILATATION & CURETTAGE/HYSTEROSCOPY WITH MYOSURE N/A 03/11/2017   Procedure: DILATATION & CURETTAGE/HYSTEROSCOPY WITH MYOSURE;  Surgeon: Allie Bossierove, Myra C, MD;  Location: [facility];  Service: Gynecology;  Laterality: N/A;  . DILATION AND CURETTAGE OF UTERUS    . TONSILLECTOMY      There were no vitals filed for this visit.  Subjective Assessment - 07/27/18 0803    Subjective  I am so ready to go back to work. Laying on my back is uncomfortable sometimes.     Patient Stated Goals  to have less pain/ To be able to sleep    Currently in Pain?  No/denies         [facility] - 07/27/18 0001      Prior Function   Vocation Requirements  supervisor, occasionally helps with pt care                   [facility] Treatment/Exercise - 07/27/18 0001      Lumbar Exercises: Stretches   Passive Hamstring Stretch  Right;Left;2 reps;30 seconds    Passive Hamstring Stretch Limitations  seated EOB    Gastroc Stretch  Right;Left;2 reps;30  seconds      Lumbar Exercises: Aerobic   Nustep  L5 5 min UE & LE      Lumbar Exercises: Seated   Other Seated Lumbar Exercises  seated pelvic tilt      Lumbar Exercises: Supine   Pelvic Tilt Limitations  posterior tilt, gentle transv abdominis engagement    Other Supine Lumbar Exercises  hooklying pelvic tilt with ball squeeze      Lumbar Exercises: Quadruped   Madcat/Old Horse  10 reps      Manual Therapy   Manual Therapy  Joint mobilization    Joint Mobilization  Lt SIJ PA mobilization    Soft tissue mobilization  bil QL, lumbar paraspinals               PT Short Term Goals - 07/20/18 1401      PT SHORT TERM GOAL #1   Title  Patient will increase lumbar flexion by 20 degrees without radicular pain     Baseline  not assessed     Time  3    Period  Weeks    Status  On-going    Target Date  08/04/18      PT SHORT TERM GOAL #2   Title  Patient will increase right gross leg strength to 4+/5     Time  3    Period  Weeks    Status  On-going    Target Date  08/04/18      PT SHORT TERM GOAL #3   Title  Patient will report 2/10 pain at worst without radicualr symptoms into her right leg     Time  3    Period  Weeks    Status  On-going    Target Date  07/28/18        PT Long Term Goals - 07/07/18 1440      PT LONG TERM GOAL #1   Title  Patient will bend down and pick item off the floor without increased radicular symptoms in order to return to work activity     Time  6    Period  Weeks    Status  New    Target Date  08/18/18      PT LONG TERM GOAL #2   Title  Patient will stand for 1 hour without reports of increased radicular pain in order to perfrom work activity     Time  6    Period  Weeks    Status  New    Target Date  08/18/18      PT LONG TERM GOAL #3   Title  Patient will sleep thorught the night without increased pain     Time  6    Period  Weeks    Status  New    Target Date  08/18/18            Plan - 07/27/18 1010    Clinical  Impression Statement  Very low tolerance for motion today. She kept saying "I am not going back to work any time soon, am I" and we discussed that she will be sore as the muscle decreases in spasm but that will not limit her returning to work. Encouraged her to ask about getting a standing desk at work so she can reduce her time spent seated. began adding stretching and mobilization with core contractions to exercise program and discussed progressions moving forward.     PT Treatment/Interventions  ADLs/Self Care Home Management;Cryotherapy;Electrical Stimulation;Moist Heat;Traction;Ultrasound;Gait training;DME Instruction;Therapeutic exercise;Therapeutic activities;Neuromuscular re-education;Patient/family education;Manual techniques;Passive range of motion;Dry needling;Splinting;Taping    PT Next Visit Plan  manual therapy as tolerated. review pelvic tilt/core contraction and progress as able    PT Home Exercise Plan  just advised patient to correct posture as abel, take deep breaths, reviewed where to get a TENS unit; gastroc stretch, seated HSS, cat/camel, pelvic tilt hooklying & seated    Consulted and Agree with Plan of Care  Patient       Patient will benefit from skilled therapeutic intervention in order to improve the following deficits and impairments:  Abnormal gait, Decreased activity tolerance, Decreased range of motion, Difficulty walking, Decreased endurance, Obesity, Improper body mechanics, Decreased strength  Visit Diagnosis: Acute right-sided low back pain with right-sided sciatica  Muscle spasm of back  Other abnormalities of gait and mobility     Problem List Patient Active Problem List   Diagnosis Date Noted  . Morbid obesity with BMI of 40.0-44.9, adult (HCC) 03/26/2018  . Essential hypertension 02/17/2017  . Abnormal uterine bleeding 01/19/2017  . Cellulitis of face   . Symptomatic anemia 09/01/2014  .  Microcytic hypochromic anemia 09/01/2014  . Dental infection  09/01/2014   Meagan Nelson C. Meagan Nelson PT, DPT 07/27/18 10:15 AM   [facility] [address] [place], [place], [address] Phone: [phone]   Fax:  [phone]  Name: Kaede Clendenen MRN: [id number] Date of Birth: 1976-01-07

## 2018-07-27 NOTE — Patient Instructions (Signed)
Access Code: [email or web address]  URL: [email or web address]  Date: 07/27/2018  Prepared by: Army Fossa   Exercises  Standing Gastroc Stretch on Step with Counter Support - 2 reps - 1 sets - 30s hold - 1x daily - 7x weekly  Seated Hamstring Stretch - 2 reps - 1 sets - 30s hold - 2x daily - 7x weekly  Cat Cow - 5 reps - 3 sets - 3s hold - 2x daily - 7x weekly  Supine Posterior Pelvic Tilt - 5 reps - 1 sets - 10s hold - 2x daily - 7x weekly  Seated Posterior Pelvic Tilt - 7x weekly

## 2018-07-29 ENCOUNTER — Ambulatory Visit: Payer: Worker's Compensation | Admitting: Physical Therapy

## 2018-08-03 ENCOUNTER — Other Ambulatory Visit: Payer: Self-pay

## 2018-08-03 ENCOUNTER — Encounter: Payer: Self-pay | Admitting: Physical Therapy

## 2018-08-03 ENCOUNTER — Ambulatory Visit: Payer: Self-pay | Attending: Primary Care | Admitting: Physical Therapy

## 2018-08-03 DIAGNOSIS — M62830 Muscle spasm of back: Secondary | ICD-10-CM | POA: Insufficient documentation

## 2018-08-03 DIAGNOSIS — M5441 Lumbago with sciatica, right side: Secondary | ICD-10-CM | POA: Insufficient documentation

## 2018-08-03 DIAGNOSIS — R2689 Other abnormalities of gait and mobility: Secondary | ICD-10-CM | POA: Insufficient documentation

## 2018-08-03 NOTE — Therapy (Signed)
[facility] [address] [place], [place], [address] Phone: [phone]   Fax:  [phone]  Physical Therapy Treatment  Patient Details  Name: Meagan RaiderKeshia Denomme MRN: [id number] Date of Birth: 04/13/1975 Referring Provider (PT): Gwinda PasseMichelle Edwards FNP    Encounter Date: 08/03/2018  PT End of Session - 08/03/18 0925    Visit Number  6    Number of Visits  12    Date for PT Re-Evaluation  08/18/18    PT Start Time  0845    PT Stop Time  0925    PT Time Calculation (min)  40 min    Activity Tolerance  Patient tolerated treatment well    Behavior During Therapy  [facility] for tasks assessed/performed       Past Medical History:  Diagnosis Date  . Abscess    facial  . Anemia   . Headache   . History of blood transfusion   . Hypertension    PMH  . Insomnia     Past Surgical History:  Procedure Laterality Date  . CESAREAN SECTION     C/S x 1  . DILATATION & CURETTAGE/HYSTEROSCOPY WITH MYOSURE N/A 03/11/2017   Procedure: DILATATION & CURETTAGE/HYSTEROSCOPY WITH MYOSURE;  Surgeon: Allie Bossierove, Myra C, MD;  Location: [facility];  Service: Gynecology;  Laterality: N/A;  . DILATION AND CURETTAGE OF UTERUS    . TONSILLECTOMY      There were no vitals filed for this visit.  Subjective Assessment - 08/03/18 0850    Subjective  "I am doing pretty good, doing the exericse at home. No pain today, havene't had any pain since seeing Shanda BumpsJessica last"     Patient Stated Goals  to have less pain/ To be able to sleep    Currently in Pain?  No/denies    Pain Score  0-No pain    Pain Orientation  Right    Pain Onset  More than a month ago    Aggravating Factors   N/A    Pain Relieving Factors  resting, heat, tennis ball         [facility] - 08/03/18 0001      Assessment   Medical Diagnosis  Low back pain with right radiculopathy     Referring Provider (PT)  Gwinda PasseMichelle Edwards FNP     Onset Date/Surgical Date  06/23/18                    [facility] Adult PT Treatment/Exercise - 08/03/18 0001      Therapeutic Activites    Therapeutic Activities  Lifting    Lifting  lifting 8# from floor <>waist, and standing stepping to place on waist height shelf 1 x 6      Lumbar Exercises: Stretches   Active Hamstring Stretch  2 reps;30 seconds   seated    Lower Trunk Rotation  --   2 x 10   Gastroc Stretch  2 reps;30 seconds      Lumbar Exercises: Aerobic   Elliptical  L1 x 5 min, elevation L1      Lumbar Exercises: Seated   Other Seated Lumbar Exercises  seated pelvic tilt 1 x 10 on dyna disc, alternating marchin on dynadisc      Lumbar Exercises: Supine   Bent Knee Raise  10 reps;2 seconds   x 2 sets   Dead Bug  --   after 2 reps pt noted increased Low back pain so halted   Straight  Leg Raise  10 reps;1 second   with sustained quad set   Straight Leg Raises Limitations  cues for posterior pelvic tilt      Manual Therapy   Joint Mobilization  long axis distraction grade IV             PT Education - 08/03/18 0928    Education Details  anatomy of the SIJ and effects of specific musculature    Person(s) Educated  Patient    Methods  Explanation;Verbal cues    Comprehension  Verbalized understanding;Verbal cues required       PT Short Term Goals - 07/20/18 1401      PT SHORT TERM GOAL #1   Title  Patient will increase lumbar flexion by 20 degrees without radicular pain     Baseline  not assessed     Time  3    Period  Weeks    Status  On-going    Target Date  08/04/18      PT SHORT TERM GOAL #2   Title  Patient will increase right gross leg strength to 4+/5     Time  3    Period  Weeks    Status  On-going    Target Date  08/04/18      PT SHORT TERM GOAL #3   Title  Patient will report 2/10 pain at worst without radicualr symptoms into her right leg     Time  3    Period  Weeks    Status  On-going    Target Date  07/28/18        PT Long Term Goals - 07/07/18 1440       PT LONG TERM GOAL #1   Title  Patient will bend down and pick item off the floor without increased radicular symptoms in order to return to work activity     Time  6    Period  Weeks    Status  New    Target Date  08/18/18      PT LONG TERM GOAL #2   Title  Patient will stand for 1 hour without reports of increased radicular pain in order to perfrom work activity     Time  6    Period  Weeks    Status  New    Target Date  08/18/18      PT LONG TERM GOAL #3   Title  Patient will sleep thorught the night without increased pain     Time  6    Period  Weeks    Status  New    Target Date  08/18/18            Plan - 08/03/18 0926    Clinical Impression Statement  no report of pain coming into the clinic today. she did not pain when attempting dead bug which exercise was regressed to alternating marching. she demonstrated improvement in trunk/leg control. practiced functional lifting which she reported no pain. plan to progress next session with functional lifting and care activation.     PT Next Visit Plan  manual therapy as tolerated. review pelvic tilt/core contraction and progress as able, functiona llifting, core using physioball, how was last session    PT Home Exercise Plan  just advised patient to correct posture as abel, take deep breaths, reviewed where to get a TENS unit; gastroc stretch, seated HSS, cat/camel, pelvic tilt hooklying & seated    Consulted and Agree with Plan of  Care  Patient       Patient will benefit from skilled therapeutic intervention in order to improve the following deficits and impairments:  Abnormal gait, Decreased activity tolerance, Decreased range of motion, Difficulty walking, Decreased endurance, Obesity, Improper body mechanics, Decreased strength  Visit Diagnosis: Acute right-sided low back pain with right-sided sciatica  Muscle spasm of back  Other abnormalities of gait and mobility     Problem List Patient Active Problem List    Diagnosis Date Noted  . Morbid obesity with BMI of 40.0-44.9, adult (HCC) 03/26/2018  . Essential hypertension 02/17/2017  . Abnormal uterine bleeding 01/19/2017  . Cellulitis of face   . Symptomatic anemia 09/01/2014  . Microcytic hypochromic anemia 09/01/2014  . Dental infection 09/01/2014    Lulu Riding PT, DPT, LAT, ATC  08/03/18  9:30 AM      [facility] [address] [place], [place], [address] Phone: [phone]   Fax:  [phone]  Name: Meagan Nelson MRN: [id number] Date of Birth: Mar 01, 1976

## 2018-08-05 ENCOUNTER — Encounter: Payer: Self-pay | Admitting: Physical Therapy

## 2018-08-05 ENCOUNTER — Ambulatory Visit: Payer: Worker's Compensation | Admitting: Physical Therapy

## 2018-08-05 ENCOUNTER — Other Ambulatory Visit: Payer: Self-pay

## 2018-08-05 DIAGNOSIS — R2689 Other abnormalities of gait and mobility: Secondary | ICD-10-CM

## 2018-08-05 DIAGNOSIS — M5441 Lumbago with sciatica, right side: Secondary | ICD-10-CM

## 2018-08-05 DIAGNOSIS — M62830 Muscle spasm of back: Secondary | ICD-10-CM

## 2018-08-05 NOTE — Therapy (Signed)
[facility] [address] [place], [place], [address] Phone: [phone]   Fax:  [phone]  Physical Therapy Treatment  Patient Details  Name: Meagan Nelson MRN: [id number] Date of Birth: July 08, 1975 Referring Provider (PT): Gwinda Passe FNP    Encounter Date: 08/05/2018  PT End of Session - 08/05/18 0846    Visit Number  7    Number of Visits  12    Date for PT Re-Evaluation  08/18/18    PT Start Time  0846    PT Stop Time  0928    PT Time Calculation (min)  42 min    Activity Tolerance  Patient tolerated treatment well    Behavior During Therapy  [facility] for tasks assessed/performed       Past Medical History:  Diagnosis Date  . Abscess    facial  . Anemia   . Headache   . History of blood transfusion   . Hypertension    PMH  . Insomnia     Past Surgical History:  Procedure Laterality Date  . CESAREAN SECTION     C/S x 1  . DILATATION & CURETTAGE/HYSTEROSCOPY WITH MYOSURE N/A 03/11/2017   Procedure: DILATATION & CURETTAGE/HYSTEROSCOPY WITH MYOSURE;  Surgeon: Allie Bossier, MD;  Location: [facility];  Service: Gynecology;  Laterality: N/A;  . DILATION AND CURETTAGE OF UTERUS    . TONSILLECTOMY      There were no vitals filed for this visit.      [facility] PT Assessment - 08/05/18 0001      Assessment   Medical Diagnosis  Low back pain with right radiculopathy     Referring Provider (PT)  Gwinda Passe FNP                    [facility] Adult PT Treatment/Exercise - 08/05/18 0001      Therapeutic Activites    Lifting  pushing sled 2 x 64ft      Exercises   Exercises  Knee/Hip      Lumbar Exercises: Stretches   Active Hamstring Stretch  2 reps;30 seconds    Quadruped Mid Back Stretch  30 seconds;3 reps   childs pose 1 x walking hands to the L     Lumbar Exercises: Aerobic   Elliptical  L1 x 5 min, elevation L1      Lumbar Exercises: Standing   Other Standing Lumbar Exercises   pressing bil UE into green physioball on table 2 x 10       Lumbar Exercises: Seated   Other Seated Lumbar Exercises  seated pelvic tilt 2 x 10 on physioball, alternating marchin on  green physioball x 3 x 10 (1 set with alternating Ue/LE movement)      Knee/Hip Exercises: Standing   Hip Abduction  2 sets;15 reps;Knee straight;Both;Right   with green theraband   Hip Extension  2 sets;15 reps;Knee straight;Stengthening;Both   with green theraband   Other Standing Knee Exercises  kettlebell dead lift from elevated 8inch step 1 x 10               PT Short Term Goals - 07/20/18 1401      PT SHORT TERM GOAL #1   Title  Patient will increase lumbar flexion by 20 degrees without radicular pain     Baseline  not assessed     Time  3    Period  Weeks    Status  On-going    Target  Date  08/04/18      PT SHORT TERM GOAL #2   Title  Patient will increase right gross leg strength to 4+/5     Time  3    Period  Weeks    Status  On-going    Target Date  08/04/18      PT SHORT TERM GOAL #3   Title  Patient will report 2/10 pain at worst without radicualr symptoms into her right leg     Time  3    Period  Weeks    Status  On-going    Target Date  07/28/18        PT Long Term Goals - 07/07/18 1440      PT LONG TERM GOAL #1   Title  Patient will bend down and pick item off the floor without increased radicular symptoms in order to return to work activity     Time  6    Period  Weeks    Status  New    Target Date  08/18/18      PT LONG TERM GOAL #2   Title  Patient will stand for 1 hour without reports of increased radicular pain in order to perfrom work activity     Time  6    Period  Weeks    Status  New    Target Date  08/18/18      PT LONG TERM GOAL #3   Title  Patient will sleep thorught the night without increased pain     Time  6    Period  Weeks    Status  New    Target Date  08/18/18            Plan - 08/05/18 0926    Clinical Impression Statement   pt noted muscle soreness but reported no pain today. continued working on core/ hip strengthening as well as posture and proper lifting mechanics. Monitored pt during dead lift due to hx or R knee pain, which she noted some soreness but reported improvement with standing hip strengthening.     PT Treatment/Interventions  ADLs/Self Care Home Management;Cryotherapy;Electrical Stimulation;Moist Heat;Traction;Ultrasound;Gait training;DME Instruction;Therapeutic exercise;Therapeutic activities;Neuromuscular re-education;Patient/family education;Manual techniques;Passive range of motion;Dry needling;Splinting;Taping    PT Next Visit Plan  manual therapy prn, functional llifting, core strenthening,  elliptical    PT Home Exercise Plan  just advised patient to correct posture as abel, take deep breaths, reviewed where to get a TENS unit; gastroc stretch, seated HSS, cat/camel, pelvic tilt hooklying & seated    Consulted and Agree with Plan of Care  Patient       Patient will benefit from skilled therapeutic intervention in order to improve the following deficits and impairments:  Abnormal gait, Decreased activity tolerance, Decreased range of motion, Difficulty walking, Decreased endurance, Obesity, Improper body mechanics, Decreased strength  Visit Diagnosis: Acute right-sided low back pain with right-sided sciatica  Muscle spasm of back  Other abnormalities of gait and mobility     Problem List Patient Active Problem List   Diagnosis Date Noted  . Morbid obesity with BMI of 40.0-44.9, adult ([facility]) 03/26/2018  . Essential hypertension 02/17/2017  . Abnormal uterine bleeding 01/19/2017  . Cellulitis of face   . Symptomatic anemia 09/01/2014  . Microcytic hypochromic anemia 09/01/2014  . Dental infection 09/01/2014   Lulu RidingKristoffer Carol Loftin PT, DPT, LAT, ATC  08/05/18  9:31 AM      [facility] [address] [place],  [place], [address] Phone:  [phone]   Fax:  [phone]  Name: Meagan Nelson MRN: [id number] Date of Birth: 06-30-1975

## 2018-08-10 ENCOUNTER — Encounter: Payer: Self-pay | Admitting: Physical Therapy

## 2018-08-10 ENCOUNTER — Other Ambulatory Visit: Payer: Self-pay

## 2018-08-10 ENCOUNTER — Ambulatory Visit: Payer: Self-pay | Admitting: Physical Therapy

## 2018-08-10 DIAGNOSIS — R2689 Other abnormalities of gait and mobility: Secondary | ICD-10-CM

## 2018-08-10 DIAGNOSIS — M5441 Lumbago with sciatica, right side: Secondary | ICD-10-CM

## 2018-08-10 DIAGNOSIS — M62830 Muscle spasm of back: Secondary | ICD-10-CM

## 2018-08-10 NOTE — Therapy (Addendum)
[place], [place], [address] Phone: [phone]   Fax:  [phone]  Physical Therapy Treatment / discharge Summary  Patient Details  Name: Jumanah Hynson MRN: [id number] Date of Birth: Nov 22, 1975 Referring Provider (PT): Juluis Mire FNP    Encounter Date: 08/10/2018  PT End of Session - 08/10/18 0853    Visit Number  8    Number of Visits  12    Date for PT Re-Evaluation  08/18/18    Authorization Type  WC     PT Start Time  0850    PT Stop Time  0928    PT Time Calculation (min)  38 min       Past Medical History:  Diagnosis Date  . Abscess    facial  . Anemia   . Headache   . History of blood transfusion   . Hypertension    PMH  . Insomnia     Past Surgical History:  Procedure Laterality Date  . CESAREAN SECTION     C/S x 1  . DILATATION & CURETTAGE/HYSTEROSCOPY WITH MYOSURE N/A 03/11/2017   Procedure: DILATATION & CURETTAGE/HYSTEROSCOPY WITH MYOSURE;  Surgeon: Emily Filbert, MD;  Location: [facility];  Service: Gynecology;  Laterality: N/A;  . DILATION AND CURETTAGE OF UTERUS    . TONSILLECTOMY      There were no vitals filed for this visit.  Subjective Assessment - 08/10/18 0850    Subjective  I am tired. No pain in 3 weeks.     Currently in Pain?  No/denies                       [facility] Adult PT Treatment/Exercise - 08/10/18 0001      Lumbar Exercises: Stretches   Active Hamstring Stretch  2 reps;30 seconds    Active Hamstring Stretch Limitations  seated    Lower Trunk Rotation  --   2 x 10   Quadruped Mid Back Stretch  30 seconds;3 reps   childs pose 1 x walking hands to the L   Gastroc Stretch  2 reps;30 seconds    Gastroc Stretch Limitations  slant board       Lumbar Exercises: Aerobic   Elliptical  L1 x 5 min, ramp 3 elevation 3      Lumbar Exercises: Standing   Other Standing Lumbar Exercises  pressing bil UE into green physioball on  table 2 x 10       Lumbar Exercises: Seated   Other Seated Lumbar Exercises  seated pelvic tilt 2 x 10 on physioball, alternating marchin on  green physioball x 3 x 10 (1 set with alternating Ue/LE movement)      Lumbar Exercises: Supine   Bent Knee Raise  10 reps;2 seconds   x 2 sets   Straight Leg Raise  10 reps;1 second   with sustained quad set   Straight Leg Raises Limitations  cues for posterior pelvic tilt      Knee/Hip Exercises: Standing   Hip Abduction  2 sets;15 reps;Knee straight;Both;Right   with green theraband   Hip Extension  2 sets;15 reps;Knee straight;Stengthening;Both   with green theraband   Other Standing Knee Exercises  kettlebell dead lift from elevated 8inch step 1 x 10 and squat 1 x 10    25#              PT Short Term Goals - 08/10/18 9449  PT SHORT TERM GOAL #1   Title  Patient will increase lumbar flexion by 20 degrees without radicular pain     Time  3    Period  Weeks    Status  Unable to assess      PT SHORT TERM GOAL #2   Title  Patient will increase right gross leg strength to 4+/5     Time  3    Period  Weeks    Status  Unable to assess      PT SHORT TERM GOAL #3   Title  Patient will report 2/10 pain at worst without radicualr symptoms into her right leg     Time  3    Period  Weeks    Status  Achieved        PT Long Term Goals - 08/10/18 0919      PT LONG TERM GOAL #1   Title  Patient will bend down and pick item off the floor without increased radicular symptoms in order to return to work activity     Time  6    Period  Weeks    Status  Achieved      PT LONG TERM GOAL #2   Title  Patient will stand for 1 hour without reports of increased radicular pain in order to perfrom work activity     Period  Weeks    Status  Achieved      PT LONG TERM GOAL #3   Title  Patient will sleep thorught the night without increased pain     Time  6    Period  Weeks    Status  Achieved            Plan - 08/10/18 0915     Clinical Impression Statement  Pt arrives reporting no pain i 3 weeks. She is on her feet all caring for her 96 year old grand daughter. She is able to pickup objects from the floor with proper mechanics without increased pain. She has returned to normal sleep without waking due to pain. She has met LTG# 1,2,3. Will assess remaining goals next visit and discharge to HEP. Focused on core and hip strength with focus on posture. Patient fatigues however completes all exercises. No pain at end of session.     PT Next Visit Plan  check remaining goals, dc to HEP next visit.     PT Home Exercise Plan  just advised patient to correct posture as abel, take deep breaths, reviewed where to get a TENS unit; gastroc stretch, seated HSS, cat/camel, pelvic tilt hooklying & seated    Consulted and Agree with Plan of Care  Patient       Patient will benefit from skilled therapeutic intervention in order to improve the following deficits and impairments:  Abnormal gait, Decreased activity tolerance, Decreased range of motion, Difficulty walking, Decreased endurance, Obesity, Improper body mechanics, Decreased strength  Visit Diagnosis: Acute right-sided low back pain with right-sided sciatica  Muscle spasm of back  Other abnormalities of gait and mobility     Problem List Patient Active Problem List   Diagnosis Date Noted  . Morbid obesity with BMI of 40.0-44.9, adult ([facility]) 03/26/2018  . Essential hypertension 02/17/2017  . Abnormal uterine bleeding 01/19/2017  . Cellulitis of face   . Symptomatic anemia 09/01/2014  . Microcytic hypochromic anemia 09/01/2014  . Dental infection 09/01/2014    Dorene Ar, PTA 08/10/2018, 9:29 AM  [facility]  [place], [place], [address] Phone: [phone]   Fax:  [phone]  Name: Emri Sample MRN: [id number] Date of Birth: November 21, 1975         PHYSICAL THERAPY DISCHARGE  SUMMARY  Visits from Start of Care: 8  Current functional level related to goals / functional outcomes: See goals   Remaining deficits: Pt has been doing very well over the course of the last 3 weeks with no report of pain or discomfort. Pt called and cancelled her last visit due to having a head cold, and opted to go ahead and discharge since she has been doing well.    Education / Equipment: HEP, theraband, posture, lifting mechanics,   Plan: Patient agrees to discharge.  Patient goals were met. Patient is being discharged due to meeting the stated rehab goals.  ?????           Kristoffer Leamon PT, DPT, LAT, ATC  08/12/18  8:25 AM

## 2018-08-12 ENCOUNTER — Ambulatory Visit: Payer: Self-pay | Admitting: Physical Therapy

## 2018-08-12 ENCOUNTER — Telehealth: Payer: Self-pay | Admitting: Nurse Practitioner

## 2018-08-12 ENCOUNTER — Telehealth: Payer: Self-pay | Admitting: Physical Therapy

## 2018-08-12 NOTE — Telephone Encounter (Signed)
Returning pt's call. She reports she is feeling sick and cancelled her appointment today. Discussed with pt since today was supposed to be her last visit and she had been doing quite well for the last 3 weeks we can go ahead and discharge her from PT which she stated she felt comfortable with that.

## 2018-08-12 NOTE — Telephone Encounter (Signed)
She can return to work plz write

## 2018-08-12 NOTE — Telephone Encounter (Signed)
Patient called stating that she was done with PT and she needed a letter for work.

## 2018-08-13 ENCOUNTER — Encounter: Payer: Self-pay | Admitting: *Deleted

## 2018-08-13 NOTE — Telephone Encounter (Signed)
Patient is aware of her letter being placed at the front desk for pick up.

## 2018-08-15 ENCOUNTER — Telehealth: Payer: Self-pay | Admitting: Primary Care

## 2018-08-16 ENCOUNTER — Telehealth: Payer: Self-pay | Admitting: Physical Therapy

## 2018-08-16 NOTE — Telephone Encounter (Signed)
LVM regarding follow up from last cancelled visit since pt wasn't feeling well. And to let her know that I sent a discharge summary to her MD.

## 2018-08-16 NOTE — Telephone Encounter (Signed)
Patients letter was drafted last Friday and placed at the front desk for pick up.

## 2018-08-27 MED FILL — HYDROCHLOROTHIAZIDE 25 MG T: 25 | 30 days supply | Qty: 30 | Fill #4

## 2018-08-30 ENCOUNTER — Telehealth: Payer: Self-pay | Admitting: Nurse Practitioner

## 2018-08-30 NOTE — Telephone Encounter (Signed)
Meagan Nelson from [employer] and patient employer called stating she needs a copy of the letter that was written for the patient to return back to work. The company states she never received the letter. Please f/u   Fax: [phone]

## 2018-08-30 NOTE — Telephone Encounter (Signed)
CMA faxed the letter.

## 2018-09-02 ENCOUNTER — Other Ambulatory Visit: Payer: Self-pay | Admitting: Obstetrics & Gynecology

## 2018-09-27 ENCOUNTER — Telehealth: Payer: Self-pay | Admitting: Nurse Practitioner

## 2018-09-27 MED FILL — HYDROCHLOROTHIAZIDE 25 MG T: 25 | 30 days supply | Qty: 30 | Fill #5

## 2018-09-27 NOTE — Telephone Encounter (Signed)
Pt would like a doctors note to be excused from her job due to the fact that her daughter has respiratory problems, and was also born under weight. there were 7 cases of corona at her Job..please follow up

## 2018-09-28 NOTE — Telephone Encounter (Signed)
If this is related to her daughter. Her daughter's primary care should be able to give her this letter as I do not have a way to verify her daughter's condition. Thanks.

## 2018-09-30 NOTE — Telephone Encounter (Signed)
CMA spoke to patient. Patient stated her daughter's pediatric would not write her a note to be out of work. CMA called the [facility] and was inform from one of the nurse that patient was asking for work note for herself to be out of work because she had a pending covid19 testing.   CMA called patient and inform her that she needs to call back her daughter's pediatric to see if they can write her note due to her daughter's health condition to be out of work.  Patient understood.

## 2018-11-09 MED FILL — HYDROCHLOROTHIAZIDE 25 MG T: 25 | 30 days supply | Qty: 30 | Fill #6

## 2018-11-18 ENCOUNTER — Other Ambulatory Visit: Payer: Self-pay | Admitting: Obstetrics & Gynecology

## 2018-11-24 ENCOUNTER — Telehealth: Payer: Self-pay | Admitting: General Practice

## 2018-11-24 NOTE — Telephone Encounter (Signed)
Patient called and left message on nurse voicemail line stating she needs a refill on her Megace. Called patient and informed her refill has already been in. Discussed she needs a follow up appt with [place] since it's been a while. Patient verbalized understanding & states she's just waiting for her medicaid to come through then she'll call us back to schedule. Patient had no questions.

## 2018-11-25 ENCOUNTER — Other Ambulatory Visit: Payer: Self-pay | Admitting: Obstetrics & Gynecology

## 2018-11-25 ENCOUNTER — Telehealth: Payer: Self-pay | Admitting: General Practice

## 2018-11-25 NOTE — Telephone Encounter (Signed)
Patient called and left message on nurse voicemail line stating her megace Rx isn't at the pharmacy and it was supposed to have been sent in yesterday. [facility], who never received Rx. Rx phoned in.  Called patient, no answer- left message stating we are trying to reach you to return your phone call. Your prescription was just called into the pharmacy again & will be available today for pickup. You may call us back if you have other questions/concerns.

## 2018-11-28 ENCOUNTER — Other Ambulatory Visit: Payer: Self-pay

## 2018-11-28 ENCOUNTER — Encounter (HOSPITAL_COMMUNITY): Payer: Self-pay

## 2018-11-28 ENCOUNTER — Emergency Department (HOSPITAL_COMMUNITY)
Admission: EM | Admit: 2018-11-28 | Discharge: 2018-11-28 | Disposition: A | Discharge status: Home / Self Care | Payer: Self-pay | Attending: Emergency Medicine | Admitting: Emergency Medicine

## 2018-11-28 DIAGNOSIS — W01190A Fall on same level from slipping, tripping and stumbling with subsequent striking against furniture, initial encounter: Secondary | ICD-10-CM | POA: Insufficient documentation

## 2018-11-28 DIAGNOSIS — S01312A Laceration without foreign body of left ear, initial encounter: Secondary | ICD-10-CM

## 2018-11-28 DIAGNOSIS — Y929 Unspecified place or not applicable: Secondary | ICD-10-CM | POA: Insufficient documentation

## 2018-11-28 DIAGNOSIS — Y9301 Activity, walking, marching and hiking: Secondary | ICD-10-CM | POA: Insufficient documentation

## 2018-11-28 DIAGNOSIS — F17210 Nicotine dependence, cigarettes, uncomplicated: Secondary | ICD-10-CM | POA: Insufficient documentation

## 2018-11-28 DIAGNOSIS — Z23 Encounter for immunization: Secondary | ICD-10-CM | POA: Insufficient documentation

## 2018-11-28 DIAGNOSIS — Z79899 Other long term (current) drug therapy: Secondary | ICD-10-CM | POA: Insufficient documentation

## 2018-11-28 DIAGNOSIS — Y999 Unspecified external cause status: Secondary | ICD-10-CM | POA: Insufficient documentation

## 2018-11-28 DIAGNOSIS — I10 Essential (primary) hypertension: Secondary | ICD-10-CM | POA: Insufficient documentation

## 2018-11-28 MED ORDER — IBUPROFEN 400 MG PO TABS
600.0000 mg | ORAL_TABLET | Freq: Once | ORAL | Status: AC
  Administered 2018-11-28: 600 mg via ORAL
  Filled 2018-11-28: qty 1

## 2018-11-28 MED ORDER — TETANUS-DIPHTH-ACELL PERTUSSIS 5-2.5-18.5 LF-MCG/0.5 IM SUSP
0.5000 mL | Freq: Once | INTRAMUSCULAR | Status: AC
  Administered 2018-11-28: 0.5 mL via INTRAMUSCULAR
  Filled 2018-11-28: qty 0.5

## 2018-11-28 NOTE — ED Provider Notes (Signed)
[facility] EMERGENCY DEPARTMENT Provider Note   CSN: [id number] Arrival date & time: 11/28/18  1245     History   Chief Complaint Chief Complaint  Patient presents with  . Ear Laceration    HPI Meagan Nelson is a 43 y.o. female with past medical history of hypertension, presenting to the emergency department with complaint of sudden onset of laceration to left ear that occurred this morning at 4 AM.  She states she got up to use the restroom and tripped over a flip-flop on the floor.  She states she fell and hit her left ear on her nightstand.  She did not pass out.  She realized she had a laceration though she needed to wait for her children to wake up before she come to the ED.  She is noted to be tachycardic on arrival though states she is very anxious regarding possibility of sutures.  No shortness of breath or chest pain.  No history of immunocompromise. Last tetanus immunization was at least 6 years ago.       HPI  Past Medical History:  Diagnosis Date  . Abscess    facial  . Anemia   . Headache   . History of blood transfusion   . Hypertension    PMH  . Insomnia     Patient Active Problem List   Diagnosis Date Noted  . Morbid obesity with BMI of 40.0-44.9, adult ([facility]) 03/26/2018  . Essential hypertension 02/17/2017  . Abnormal uterine bleeding 01/19/2017  . Cellulitis of face   . Symptomatic anemia 09/01/2014  . Microcytic hypochromic anemia 09/01/2014  . Dental infection 09/01/2014    Past Surgical History:  Procedure Laterality Date  . CESAREAN SECTION     C/S x 1  . DILATATION & CURETTAGE/HYSTEROSCOPY WITH MYOSURE N/A 03/11/2017   Procedure: DILATATION & CURETTAGE/HYSTEROSCOPY WITH MYOSURE;  Surgeon: Emily Filbert, MD;  Location: [facility];  Service: Gynecology;  Laterality: N/A;  . DILATION AND CURETTAGE OF UTERUS    . TONSILLECTOMY       OB History    Gravida  7   Para  0   Term      Preterm      AB  1   Living  6     SAB  1   TAB      Ectopic      Multiple      Live Births  5            Home Medications    Prior to Admission medications   Medication Sig Start Date End Date Taking? Authorizing Provider  amLODipine (NORVASC) 10 MG tablet Take 1 tablet (10 mg total) by mouth daily. 03/25/18   Charlott Rakes, MD  cyclobenzaprine (FLEXERIL) 10 MG tablet Take 1 tablet (10 mg total) by mouth 3 (three) times daily as needed for muscle spasms. 07/02/18   Kerin Perna, NP  IBU 600 MG tablet TAKE 1 TABLET BY MOUTH EVERY 6 HOURS IF NEEDED 02/15/18   Clovia Cuff C, MD  megestrol (MEGACE) 40 MG tablet TAKE 1 TABLET BY MOUTH TWICE DAILY 11/24/18   Emily Filbert, MD    Family History Family History  Problem Relation Age of Onset  . Hypertension Mother   . Anemia Sister   . Anemia Brother   . Anemia Sister   . Anemia Other   . Hypertension Other     Social History Social History   Tobacco Use  .  Smoking status: Current Some Day Smoker    Packs/day: 0.10    Types: Cigarettes  . Smokeless tobacco: Never Used  Substance Use Topics  . Alcohol use: Yes    Comment: ocassinally   . Drug use: No     Allergies   Naprosyn [naproxen]   Review of Systems Review of Systems  Respiratory: Negative for shortness of breath.   Cardiovascular: Negative for chest pain.  Skin: Positive for wound.  Neurological: Negative for syncope and headaches.     Physical Exam Updated Vital Signs BP (!) 159/109 (BP Location: Right Arm)   Pulse (!) 120   Temp 98.8 F (37.1 C) (Oral)   Resp 16   SpO2 97%   Physical Exam Vitals signs and nursing note reviewed.  Constitutional:      Appearance: She is well-developed.     Comments: Pt appears somewhat anxious, though is pleasant and conversant. No acute distress  HENT:     Head: Normocephalic and atraumatic.     Right Ear: Tympanic membrane normal.     Left Ear: Tympanic membrane normal.     Ears:     Comments: There are 2 lacerations  to left ear lobe and auricle. See image below. Laceration to lower portion of the auricle does not appear deeper than subcutaneous layer, no obvious cartilage damage.  Eyes:     Conjunctiva/sclera: Conjunctivae normal.  Cardiovascular:     Rate and Rhythm: Regular rhythm. Tachycardia present.  Pulmonary:     Effort: Pulmonary effort is normal.  Neurological:     Mental Status: She is alert.  Psychiatric:        Mood and Affect: Mood normal.        Behavior: Behavior normal.        ED Treatments / Results  Labs (all labs ordered are listed, but only abnormal results are displayed) Labs Reviewed - No data to display  EKG None  Radiology No results found.  Procedures .Marland Kitchen.Laceration Repair  Date/Time: 11/28/2018 9:10 AM Performed by: Anab Vivar, [place] N, PA-C Authorized by: Dareen Gutzwiller, [place] N, PA-C   Consent:    Consent obtained:  Verbal   Consent given by:  Patient   Risks discussed:  Infection and poor cosmetic result   Alternatives discussed:  No treatment Anesthesia (see MAR for exact dosages):    Anesthesia method:  None Laceration details:    Location:  Ear   Ear location:  L ear   Length (cm):  1.5 Repair type:    Repair type:  Simple Exploration:    Hemostasis achieved with:  Direct pressure   Wound exploration: entire depth of wound probed and visualized     Wound extent: no foreign bodies/material noted     Contaminated: no   Treatment:    Area cleansed with:  Saline   Amount of cleaning:  Standard   Visualized foreign bodies/material removed: no   Skin repair:    Repair method:  Tissue adhesive Approximation:    Approximation:  Close Post-procedure details:    Dressing:  Open (no dressing)   Patient tolerance of procedure:  Tolerated well, no immediate complications .Marland Kitchen.Laceration Repair  Date/Time: 11/28/2018 9:11 AM Performed by: Sarabi Sockwell, [place] N, PA-C Authorized by: Kandas Oliveto, [place] N, PA-C   Consent:    Consent obtained:  Verbal   Consent  given by:  Patient   Risks discussed:  Poor cosmetic result, infection and pain   Alternatives discussed:  No treatment Anesthesia (see MAR for exact dosages):  Anesthesia method:  None Laceration details:    Location:  Ear   Ear location:  L ear   Length (cm):  2.5 Repair type:    Repair type:  Simple Exploration:    Hemostasis achieved with:  Direct pressure   Wound exploration: entire depth of wound probed and visualized     Wound extent: no foreign bodies/material noted     Wound extent comment:  No cartilage damage   Contaminated: no   Treatment:    Area cleansed with:  Saline   Amount of cleaning:  Standard   Visualized foreign bodies/material removed: no   Skin repair:    Repair method:  Tissue adhesive Approximation:    Approximation:  Close Post-procedure details:    Dressing:  Open (no dressing)   Patient tolerance of procedure:  Tolerated well, no immediate complications   (including critical care time)  Medications Ordered in ED Medications  Tdap (BOOSTRIX) injection 0.5 mL (0.5 mLs Intramuscular Given 11/28/18 0857)  ibuprofen (ADVIL) tablet 600 mg (600 mg Oral Given 11/28/18 0855)     Initial Impression / Assessment and Plan / ED Course  I have reviewed the triage vital signs and the nursing notes.  Pertinent labs & imaging results that were available during my care of the patient were reviewed by me and considered in my medical decision making (see chart for details).        Patient with lacerations to left ear after mechanical fall this morning.  No syncope.  There does not appear to be any damage to the cartilage on exam.  TM appears normal.  She is quite anxious on arrival.  Denies chest pain or shortness of breath.  She did not take her BP medications this morning. Unfortunately, a repeat VS was not obtained prior to discharge, though low suspicion for concerning cause of tachycardia given patient's nervousness. Wounds were irrigated and closed with  Dermabond with good wound approximation.  Tetanus is updated.  She was instructed of wound care and return precautions.  Patient is agreeable to plan and safe for discharge.  Discussed results, findings, treatment and follow up. Patient advised of return precautions. Patient verbalized understanding and agreed with plan.  Final Clinical Impressions(s) / ED Diagnoses   Final diagnoses:  Laceration of left ear, initial encounter    ED Discharge Orders    None       Charletta Voight, [place] N, PA-C 11/28/18 8309    Melene Plan, DO 11/28/18 1414

## 2018-11-28 NOTE — Discharge Instructions (Signed)
Please read instructions below.  Keep your wound clean.  Do not apply any ointments as this can breakdown the glue.  The glue will fall off on its own.  You can take over-the-counter medications as needed for pain. Apply ice for 15 minutes at a time as needed for swelling. Follow up with your primary care for wound recheck as needed.  Return to the ER for fever, large redness or swelling, or new or worsening symptoms.

## 2018-11-28 NOTE — ED Notes (Signed)
Pt verbalized understanding of discharge instructions and denies any further questions at this time.   

## 2018-11-28 NOTE — ED Triage Notes (Signed)
Patient with ear laceration after falling and hitting same on dresser at 0400. No bleeding, no pain

## 2019-01-14 MED FILL — HYDROCHLOROTHIAZIDE 25 MG T: 25 | 30 days supply | Qty: 30 | Fill #8

## 2019-01-24 ENCOUNTER — Other Ambulatory Visit: Payer: Self-pay | Admitting: Obstetrics & Gynecology

## 2019-02-16 MED FILL — HYDROCHLOROTHIAZIDE 25 MG T: 25 | 90 days supply | Qty: 90 | Fill #0

## 2019-05-25 ENCOUNTER — Other Ambulatory Visit: Payer: Self-pay | Admitting: Nurse Practitioner

## 2019-05-25 DIAGNOSIS — I10 Essential (primary) hypertension: Secondary | ICD-10-CM

## 2019-05-26 ENCOUNTER — Other Ambulatory Visit: Payer: Self-pay | Admitting: Nurse Practitioner

## 2019-05-26 DIAGNOSIS — I10 Essential (primary) hypertension: Secondary | ICD-10-CM

## 2019-05-30 ENCOUNTER — Encounter: Payer: Self-pay | Admitting: Nurse Practitioner

## 2019-05-30 ENCOUNTER — Ambulatory Visit: Payer: Self-pay | Attending: Nurse Practitioner | Admitting: Nurse Practitioner

## 2019-05-30 ENCOUNTER — Other Ambulatory Visit: Payer: Self-pay

## 2019-05-30 VITALS — BP 170/107 | HR 97 | Temp 96.3°F | Ht 66.0 in | Wt 289.0 lb

## 2019-05-30 DIAGNOSIS — I10 Essential (primary) hypertension: Secondary | ICD-10-CM | POA: Insufficient documentation

## 2019-05-30 DIAGNOSIS — Z6841 Body Mass Index (BMI) 40.0 and over, adult: Secondary | ICD-10-CM | POA: Insufficient documentation

## 2019-05-30 DIAGNOSIS — M545 Low back pain, unspecified: Secondary | ICD-10-CM

## 2019-05-30 DIAGNOSIS — G4700 Insomnia, unspecified: Secondary | ICD-10-CM | POA: Insufficient documentation

## 2019-05-30 DIAGNOSIS — Z760 Encounter for issue of repeat prescription: Secondary | ICD-10-CM | POA: Insufficient documentation

## 2019-05-30 DIAGNOSIS — G8929 Other chronic pain: Secondary | ICD-10-CM | POA: Insufficient documentation

## 2019-05-30 DIAGNOSIS — F5101 Primary insomnia: Secondary | ICD-10-CM | POA: Insufficient documentation

## 2019-05-30 DIAGNOSIS — Z131 Encounter for screening for diabetes mellitus: Secondary | ICD-10-CM

## 2019-05-30 DIAGNOSIS — D509 Iron deficiency anemia, unspecified: Secondary | ICD-10-CM | POA: Insufficient documentation

## 2019-05-30 DIAGNOSIS — Z79899 Other long term (current) drug therapy: Secondary | ICD-10-CM | POA: Insufficient documentation

## 2019-05-30 DIAGNOSIS — F17200 Nicotine dependence, unspecified, uncomplicated: Secondary | ICD-10-CM

## 2019-05-30 DIAGNOSIS — Z13228 Encounter for screening for other metabolic disorders: Secondary | ICD-10-CM

## 2019-05-30 DIAGNOSIS — F17210 Nicotine dependence, cigarettes, uncomplicated: Secondary | ICD-10-CM | POA: Insufficient documentation

## 2019-05-30 DIAGNOSIS — Z8249 Family history of ischemic heart disease and other diseases of the circulatory system: Secondary | ICD-10-CM | POA: Insufficient documentation

## 2019-05-30 MED ORDER — AMLODIPINE BESYLATE 10 MG PO TABS: 10.0000 mg | ORAL_TABLET | Freq: Every day | ORAL | 2 refills | Status: DC

## 2019-05-30 MED ORDER — TRAZODONE HCL 50 MG PO TABS: 50.0000 mg | ORAL_TABLET | Freq: Every evening | ORAL | 1 refills | Status: DC | PRN

## 2019-05-30 MED ORDER — IBUPROFEN 600 MG PO TABS: 600.0000 mg | ORAL_TABLET | Freq: Three times a day (TID) | ORAL | 1 refills | Status: DC

## 2019-05-30 MED ORDER — CYCLOBENZAPRINE HCL 10 MG PO TABS: 10.0000 mg | ORAL_TABLET | Freq: Three times a day (TID) | ORAL | 1 refills | Status: DC | PRN

## 2019-05-30 MED FILL — CYCLOBENZAPRINE 10 MG TAB: 10 | 20 days supply | Qty: 60 | Fill #0

## 2019-05-30 MED FILL — IBUPROFEN 600 MG TABLET: 600 | 20 days supply | Qty: 60 | Fill #0

## 2019-05-30 MED FILL — AMLODIPINE BESYLATE 10 MG T: 10 | 30 days supply | Qty: 30 | Fill #0

## 2019-05-30 MED FILL — traZODone HCL 50 MG TABS: 50 | 30 days supply | Qty: 30 | Fill #0

## 2019-05-30 NOTE — Progress Notes (Signed)
Assessment & Plan:  [name] was seen today for medication refill.  Diagnoses and all orders for this visit:  Essential hypertension -     CMP14+EGFR -     amLODipine (NORVASC) 10 MG tablet; Take 1 tablet (10 mg total) by mouth daily. Continue all antihypertensives as prescribed.  Remember to bring in your blood pressure log with you for your follow up appointment.  DASH/Mediterranean Diets are healthier choices for HTN.    Tobacco dependence [name] was counseled on the dangers of tobacco use, and was advised to quit. Reviewed strategies to maximize success, including removing cigarettes and smoking materials from environment, stress management and support of family/friends as well as pharmacological alternatives including: Wellbutrin, Chantix, Nicotine patch, Nicotine gum or lozenges. Smoking cessation support: smoking cessation hotline: 1-800-QUIT-NOW.  Smoking cessation classes are also available through [facility] and [facility]. Call 6801572299 or visit our website at [email or web address]   A total of 2 minutes was spent on counseling for smoking cessation and [name] is not ready to quit.   Chronic right-sided low back pain without sciatica Symptoms are well controlled with as needed use of NSAIDs and muscle relaxant. -     cyclobenzaprine (FLEXERIL) 10 MG tablet; Take 1 tablet (10 mg total) by mouth 3 (three) times daily as needed for muscle spasms. -     ibuprofen (IBU) 600 MG tablet; Take 1 tablet (600 mg total) by mouth 3 (three) times daily. Work on losing weight to help reduce back pain. May alternate with heat and ice application for pain relief. May also alternate with acetaminophen  as prescribed for back pain. Other alternatives include massage, acupuncture and water aerobics.  You must stay active and avoid a sedentary lifestyle.    Primary insomnia -     traZODone (DESYREL) 50 MG tablet; Take 1 tablet (50 mg total) by mouth at bedtime as needed for  sleep.  Encounter for screening for diabetes mellitus -     Hemoglobin A1c  Screening for metabolic disorder -     RJJ88+CZYS  Microcytic hypochromic anemia -     CBC  Morbid obesity with BMI of 45.0-49.9, adult ([place]) -     Lipid panel    Patient has been counseled on age-appropriate routine health concerns for screening and prevention. These are reviewed and up-to-date. Referrals have been placed accordingly. Immunizations are up-to-date or declined.    Subjective:   Chief Complaint  Patient presents with  . Medication Refill    Pt. is here for medication refills.    HPI Meagan Nelson 44 y.o. female presents to office today for follow up.  Essential Hypertension Unfortunately she has continued to take hydrochlorothiazide 25 mg daily after being instructed to discontinue hydrochlorothiazide due to hypokalemia and start amlodipine 10 mg daily. Denies chest pain, shortness of breath, palpitations, lightheadedness, dizziness, headaches or BLE edema.  She was instructed today to on the hydrochlorothiazide and we will start the amlodipine and she will return in 2 weeks for blood pressure check.  BP Readings from Last 3 Encounters:  05/30/19 (!) 170/107  11/28/18 (!) 159/109  06/26/18 (!) 178/100    INSOMNIA Endorses difficulty falling asleep and staying asleep.  Denies any caffeine consumption.  She does not nap during the day and reports melatonin has been ineffective.   Review of Systems  Constitutional: Negative for fever, malaise/fatigue and weight loss.  HENT: Negative.  Negative for nosebleeds.   Eyes: Negative.  Negative for blurred vision, double vision and  photophobia.  Respiratory: Negative.  Negative for cough and shortness of breath.   Cardiovascular: Negative.  Negative for chest pain, palpitations and leg swelling.  Gastrointestinal: Negative.  Negative for heartburn, nausea and vomiting.  Musculoskeletal: Positive for back pain and myalgias.  Neurological:  Negative.  Negative for dizziness, focal weakness, seizures and headaches.  Psychiatric/Behavioral: Negative for suicidal ideas. The patient has insomnia.     Past Medical History:  Diagnosis Date  . Abscess    facial  . Anemia   . Headache   . History of blood transfusion   . Hypertension    PMH  . Insomnia     Past Surgical History:  Procedure Laterality Date  . CESAREAN SECTION     C/S x 1  . DILATATION & CURETTAGE/HYSTEROSCOPY WITH MYOSURE N/A 03/11/2017   Procedure: DILATATION & CURETTAGE/HYSTEROSCOPY WITH MYOSURE;  Surgeon: Emily Filbert, MD;  Location: [facility];  Service: Gynecology;  Laterality: N/A;  . DILATION AND CURETTAGE OF UTERUS    . TONSILLECTOMY      Family History  Problem Relation Age of Onset  . Hypertension Mother   . Anemia Sister   . Anemia Brother   . Anemia Sister   . Anemia Other   . Hypertension Other     Social History Reviewed with no changes to be made today.   Outpatient Medications Prior to Visit  Medication Sig Dispense Refill  . megestrol (MEGACE) 40 MG tablet TAKE 1 TABLET BY MOUTH TWICE DAILY 60 tablet 0  . amLODipine (NORVASC) 10 MG tablet Take 1 tablet (10 mg total) by mouth daily. (Patient not taking: Reported on 05/30/2019) 30 tablet 2  . cyclobenzaprine (FLEXERIL) 10 MG tablet Take 1 tablet (10 mg total) by mouth 3 (three) times daily as needed for muscle spasms. (Patient not taking: Reported on 05/30/2019) 60 tablet 1  . IBU 600 MG tablet TAKE 1 TABLET BY MOUTH EVERY 6 HOURS IF NEEDED (Patient not taking: Reported on 05/30/2019) 30 tablet 0   No facility-administered medications prior to visit.    Allergies  Allergen Reactions  . Naprosyn [Naproxen] Nausea And Vomiting       Objective:    BP (!) 170/107 (BP Location: Left Arm, Patient Position: Sitting, Cuff Size: Large)   Pulse 97   Temp (!) 96.3 F (35.7 C) (Temporal)   Ht _0  (1.676 m)   Wt 289 lb (131.1 kg)   SpO2 97%   BMI 46.65 kg/m  Wt  Readings from Last 3 Encounters:  05/30/19 289 lb (131.1 kg)  03/26/18 261 lb (118.4 kg)  01/29/18 259 lb (117.5 kg)    Physical Exam Vitals and nursing note reviewed.  Constitutional:      Appearance: She is well-developed.  HENT:     Head: Normocephalic and atraumatic.  Cardiovascular:     Rate and Rhythm: Normal rate and regular rhythm.     Heart sounds: Normal heart sounds. No murmur. No friction rub. No gallop.   Pulmonary:     Effort: Pulmonary effort is normal. No tachypnea or respiratory distress.     Breath sounds: Normal breath sounds. No decreased breath sounds, wheezing, rhonchi or rales.  Chest:     Chest wall: No tenderness.  Abdominal:     General: Bowel sounds are normal.     Palpations: Abdomen is soft.  Musculoskeletal:        General: Normal range of motion.     Cervical back: Normal range of motion.  Skin:    General: Skin is warm and dry.  Neurological:     Mental Status: She is alert and oriented to person, place, and time.     Coordination: Coordination normal.  Psychiatric:        Behavior: Behavior normal. Behavior is cooperative.        Thought Content: Thought content normal.        Judgment: Judgment normal.          Patient has been counseled extensively about nutrition and exercise as well as the importance of adherence with medications and regular follow-up. The patient was given clear instructions to go to ER or return to medical center if symptoms don't improve, worsen or new problems develop. The patient verbalized understanding.   Follow-up: Return in about 2 weeks (around 06/13/2019) for BP recheck.   Gildardo Pounds, FNP-BC [facility] [place], [place]   05/30/2019, 10:35 AM

## 2019-05-31 ENCOUNTER — Telehealth: Payer: Self-pay | Admitting: Family Medicine

## 2019-05-31 LAB — CBC
Hematocrit: 46.5 % (ref 34.0–46.6)
Hemoglobin: 16.3 g/dL — ABNORMAL HIGH (ref 11.1–15.9)
MCH: 32.1 pg (ref 26.6–33.0)
MCHC: 35.1 g/dL (ref 31.5–35.7)
MCV: 92 fL (ref 79–97)
Platelets: 205 10*3/uL (ref 150–450)
RBC: 5.07 x10E6/uL (ref 3.77–5.28)
RDW: 13.6 % (ref 11.7–15.4)
WBC: 11.5 10*3/uL — ABNORMAL HIGH (ref 3.4–10.8)

## 2019-05-31 LAB — CMP14+EGFR
ALT: 23 IU/L (ref 0–32)
AST: 33 IU/L (ref 0–40)
Albumin/Globulin Ratio: 1.5 (ref 1.2–2.2)
Albumin: 3.8 g/dL (ref 3.8–4.8)
Alkaline Phosphatase: 74 IU/L (ref 39–117)
BUN/Creatinine Ratio: 13 (ref 9–23)
BUN: 9 mg/dL (ref 6–24)
Bilirubin Total: 0.5 mg/dL (ref 0.0–1.2)
CO2: 29 mmol/L (ref 20–29)
Calcium: 9.5 mg/dL (ref 8.7–10.2)
Chloride: 100 mmol/L (ref 96–106)
Creatinine, Ser: 0.72 mg/dL (ref 0.57–1.00)
GFR calc Af Amer: 118 mL/min/{1.73_m2} (ref >59)
GFR calc non Af Amer: 102 mL/min/{1.73_m2} (ref >59)
Globulin, Total: 2.6 g/dL (ref 1.5–4.5)
Glucose: 107 mg/dL — ABNORMAL HIGH (ref 65–99)
Potassium: 3.8 mmol/L (ref 3.5–5.2)
Sodium: 144 mmol/L (ref 134–144)
Total Protein: 6.4 g/dL (ref 6.0–8.5)

## 2019-05-31 LAB — HEMOGLOBIN A1C
Est. average glucose Bld gHb Est-mCnc: 114 mg/dL
Hgb A1c MFr Bld: 5.6 % (ref 4.8–5.6)

## 2019-05-31 LAB — LIPID PANEL
Chol/HDL Ratio: 5.8 ratio — ABNORMAL HIGH (ref 0.0–4.4)
Cholesterol, Total: 185 mg/dL (ref 100–199)
HDL: 32 mg/dL — ABNORMAL LOW (ref >39)
LDL Chol Calc (NIH): 118 mg/dL — ABNORMAL HIGH (ref 0–99)
Triglycerides: 200 mg/dL — ABNORMAL HIGH (ref 0–149)
VLDL Cholesterol Cal: 35 mg/dL (ref 5–40)

## 2019-05-31 NOTE — Telephone Encounter (Signed)
Patient called to say she was bleeding, and passing clots. She was offered an appointment today, but she can not come in. She would like a nurse to call her back.

## 2019-05-31 NOTE — Telephone Encounter (Signed)
I called Glendia back and she states she has gone thru 10 packs of pads and has been changing her pad every 30 minutes and it is full- and this  Has been going on for 10 days. I informed her since she cannot come into the office today ( offered afternoon appointment she states she cannot make) that she will need to go to the hospital for evaluation because that is too much bleeding. She voices understanding. Breeana Sawtelle,RN

## 2019-06-10 ENCOUNTER — Other Ambulatory Visit: Payer: Self-pay

## 2019-06-10 ENCOUNTER — Ambulatory Visit (INDEPENDENT_AMBULATORY_CARE_PROVIDER_SITE_OTHER): Payer: Medicaid Other | Admitting: Family Medicine

## 2019-06-10 ENCOUNTER — Encounter: Payer: Self-pay | Admitting: Family Medicine

## 2019-06-10 ENCOUNTER — Other Ambulatory Visit: Payer: Self-pay | Admitting: Family Medicine

## 2019-06-10 VITALS — BP 155/110 | HR 111 | Wt 284.0 lb

## 2019-06-10 DIAGNOSIS — Z01419 Encounter for gynecological examination (general) (routine) without abnormal findings: Secondary | ICD-10-CM

## 2019-06-10 DIAGNOSIS — N939 Abnormal uterine and vaginal bleeding, unspecified: Secondary | ICD-10-CM

## 2019-06-10 MED ORDER — MEGESTROL ACETATE 40 MG PO TABS: 20.0000 mg | ORAL_TABLET | Freq: Every day | ORAL | 3 refills | Status: DC

## 2019-06-10 MED FILL — MEGESTROL 40 MG TABLET: 40 | 30 days supply | Qty: 15 | Fill #0

## 2019-06-10 NOTE — Progress Notes (Signed)
GYNECOLOGY ANNUAL PREVENTATIVE CARE ENCOUNTER NOTE  Subjective:   Meagan Nelson is a 44 y.o. G11P0016 female here for a routine annual gynecologic exam.  Current complaints: menorrhagia. Has been using megace 40mg  every other day or occasionally. Has some breakthrough bleeding occasionally. Denies abnormal vaginal bleeding, discharge, pelvic pain, problems with intercourse or other gynecologic concerns.    Gynecologic History No LMP recorded. Last Pap: 1 year ago. Results were: normal Last mammogram: due  Obstetric History OB History  Gravida Para Term Preterm AB Living  7 0     1 6  SAB TAB Ectopic Multiple Live Births  1       5    # Outcome Date GA Lbr Len/2nd Weight Sex Delivery Anes PTL Lv  7 SAB           6 Gravida      Vag-Spont   LIV  5 Gravida      Vag-Spont   FD  4 Gravida      Vag-Spont   LIV  3 Gravida      Vag-Spont   LIV  2 Gravida      Vag-Spont   LIV  1 Gravida      Vag-Spont   LIV    Past Medical History:  Diagnosis Date  . Abscess    facial  . Anemia   . Headache   . History of blood transfusion   . Hypertension    PMH  . Insomnia     Past Surgical History:  Procedure Laterality Date  . CESAREAN SECTION     C/S x 1  . DILATATION & CURETTAGE/HYSTEROSCOPY WITH MYOSURE N/A 03/11/2017   Procedure: DILATATION & CURETTAGE/HYSTEROSCOPY WITH MYOSURE;  Surgeon: Emily Filbert, MD;  Location: [place] [facility];  Service: Gynecology;  Laterality: N/A;  . DILATION AND CURETTAGE OF UTERUS    . TONSILLECTOMY      Current Outpatient Medications on File Prior to Visit  Medication Sig Dispense Refill  . amLODipine (NORVASC) 10 MG tablet Take 1 tablet (10 mg total) by mouth daily. 30 tablet 2  . cyclobenzaprine (FLEXERIL) 10 MG tablet Take 1 tablet (10 mg total) by mouth 3 (three) times daily as needed for muscle spasms. 60 tablet 1  . ibuprofen (IBU) 600 MG tablet Take 1 tablet (600 mg total) by mouth 3 (three) times daily. 60 tablet 1  . traZODone  (DESYREL) 50 MG tablet Take 1 tablet (50 mg total) by mouth at bedtime as needed for sleep. 90 tablet 1   No current facility-administered medications on file prior to visit.    Allergies  Allergen Reactions  . Naprosyn [Naproxen] Nausea And Vomiting    Social History   Socioeconomic History  . Marital status: Single    Spouse name: Not on file  . Number of children: Not on file  . Years of education: Not on file  . Highest education level: Not on file  Occupational History  . Not on file  Tobacco Use  . Smoking status: Current Some Day Smoker    Packs/day: 0.10    Types: Cigarettes  . Smokeless tobacco: Never Used  Substance and Sexual Activity  . Alcohol use: Yes    Comment: ocassinally   . Drug use: No  . Sexual activity: Yes    Birth control/protection: None  Other Topics Concern  . Not on file  Social History Narrative  . Not on file   Social Determinants of Health  Financial Resource Strain:   . Difficulty of Paying Living Expenses:   Food Insecurity:   . Worried About Programme researcher, broadcasting/film/video in the Last Year:   . Barista in the Last Year:   Transportation Needs:   . Freight forwarder (Medical):   Marland Kitchen Lack of Transportation (Non-Medical):   Physical Activity:   . Days of Exercise per Week:   . Minutes of Exercise per Session:   Stress:   . Feeling of Stress :   Social Connections:   . Frequency of Communication with Friends and Family:   . Frequency of Social Gatherings with Friends and Family:   . Attends Religious Services:   . Active Member of Clubs or Organizations:   . Attends Banker Meetings:   Marland Kitchen Marital Status:   Intimate Partner Violence:   . Fear of Current or Ex-Partner:   . Emotionally Abused:   Marland Kitchen Physically Abused:   . Sexually Abused:     Family History  Problem Relation Age of Onset  . Hypertension Mother   . Anemia Sister   . Anemia Brother   . Anemia Sister   . Anemia Other   . Hypertension Other      The following portions of the patient's history were reviewed and updated as appropriate: allergies, current medications, past family history, past medical history, past social history, past surgical history and problem list.  Review of Systems Pertinent items are noted in HPI.   Objective:  BP (!) 155/110   Pulse (!) 111   Wt 284 lb (128.8 kg)   BMI 45.84 kg/m  Wt Readings from Last 3 Encounters:  06/10/19 284 lb (128.8 kg)  05/30/19 289 lb (131.1 kg)  03/26/18 261 lb (118.4 kg)     Chaperone present during exam  CONSTITUTIONAL: Well-developed, well-nourished female in no acute distress.  HENT:  Normocephalic, atraumatic, External right and left ear normal. Oropharynx is clear and moist EYES: Conjunctivae and EOM are normal. Pupils are equal, round, and reactive to light. No scleral icterus.  NECK: Normal range of motion, supple, no masses.  Normal thyroid.   CARDIOVASCULAR: Normal heart rate noted, regular rhythm RESPIRATORY: Clear to auscultation bilaterally. Effort and breath sounds normal, no problems with respiration noted. BREASTS: Symmetric in size. No masses, skin changes, nipple drainage, or lymphadenopathy. ABDOMEN: Soft, normal bowel sounds, no distention noted.  No tenderness, rebound or guarding.  PELVIC: deferred MUSCULOSKELETAL: Normal range of motion. No tenderness.  No cyanosis, clubbing, or edema.  2+ distal pulses. SKIN: Skin is warm and dry. No rash noted. Not diaphoretic. No erythema. No pallor. NEUROLOGIC: Alert and oriented to person, place, and time. Normal reflexes, muscle tone coordination. No cranial nerve deficit noted. PSYCHIATRIC: Normal mood and affect. Normal behavior. Normal judgment and thought content.  Assessment:  Annual gynecologic examination with pap smear   Plan:  1. Well Woman Exam [facility]CP info given for Mammogram and PAP  2. Abnormal uterine bleeding Continue megace - 20mg  daily   Routine preventative health maintenance measures  emphasized. Please refer to After Visit Summary for other counseling recommendations.    , DO [facility]

## 2019-06-10 NOTE — Progress Notes (Signed)
Was taking megace to help with cycles Ran out and needs more.  Says BP Meds are not working

## 2019-06-13 ENCOUNTER — Other Ambulatory Visit: Payer: Self-pay

## 2019-06-13 ENCOUNTER — Ambulatory Visit: Payer: Self-pay | Attending: Nurse Practitioner | Admitting: Pharmacist

## 2019-06-13 VITALS — BP 134/91 | HR 90

## 2019-06-13 DIAGNOSIS — Y929 Unspecified place or not applicable: Secondary | ICD-10-CM | POA: Insufficient documentation

## 2019-06-13 DIAGNOSIS — D509 Iron deficiency anemia, unspecified: Secondary | ICD-10-CM | POA: Insufficient documentation

## 2019-06-13 DIAGNOSIS — T461X5A Adverse effect of calcium-channel blockers, initial encounter: Secondary | ICD-10-CM | POA: Insufficient documentation

## 2019-06-13 DIAGNOSIS — Z1231 Encounter for screening mammogram for malignant neoplasm of breast: Secondary | ICD-10-CM

## 2019-06-13 DIAGNOSIS — Z7901 Long term (current) use of anticoagulants: Secondary | ICD-10-CM | POA: Insufficient documentation

## 2019-06-13 DIAGNOSIS — Z0131 Encounter for examination of blood pressure with abnormal findings: Secondary | ICD-10-CM

## 2019-06-13 DIAGNOSIS — M7989 Other specified soft tissue disorders: Secondary | ICD-10-CM | POA: Insufficient documentation

## 2019-06-13 DIAGNOSIS — I10 Essential (primary) hypertension: Secondary | ICD-10-CM | POA: Insufficient documentation

## 2019-06-13 MED ORDER — LOSARTAN POTASSIUM 50 MG PO TABS: 50.0000 mg | ORAL_TABLET | Freq: Every day | ORAL | 2 refills | Status: DC

## 2019-06-13 MED FILL — LOSARTAN POTASSIUM 50 MG TA: 50 | 30 days supply | Qty: 30 | Fill #0

## 2019-06-13 NOTE — Progress Notes (Signed)
   S:    Patient arrives in good spirits. Presents to the clinic for hypertension evaluation, counseling, and management.  Patient was referred and last seen by Primary Care Provider on 05/30/2019 and HCTZ was discontinued due to hypokalemia and amlodipine 10 mg was initiated.    Patient reports adherence with medications and did take 1 tablet of Amlodipine 10 mg this morning. Patient reports taking 2 tablets of Amlodipine 10 mg daily for 2 days because her blood pressure was high. Patient reports home BP readings of 155/107 and 162/105. Patient endorses swelling immediately after starting amlodipine (taking 1 tablet at the time) and reports headaches. Patient denies dizziness, visual changes, and chest pain.   Current BP Medications include: Amlodipine 10 mg daily   Antihypertensives tried in the past include: HCTZ (hypokalemia)  Dietary habits include: currently juicing with spinach, bananas, oranges, carrots and drinking water Exercise habits include: walking 30 mins everyday with children Family / Social history:  -Fhx: Hypertension (mother) -Tobacco use: current some day smoker (0.1 packs/day), denies smokeless tobacco -Alcohol use: occassionally  O:  Home BP readings: 155/107, 162/105  Vitals:   06/13/19 1002  BP: (!) 134/91  Pulse: 90   Last 3 Office BP readings: BP Readings from Last 3 Encounters:  06/13/19 (!) 134/91  06/10/19 (!) 155/110  05/30/19 (!) 170/107   BMET    Component Value Date/Time   NA 144 05/30/2019 1030   K 3.8 05/30/2019 1030   CL 100 05/30/2019 1030   CO2 29 05/30/2019 1030   GLUCOSE 107 (H) 05/30/2019 1030   GLUCOSE 98 03/11/2017 1342   BUN 9 05/30/2019 1030   CREATININE 0.72 05/30/2019 1030   CALCIUM 9.5 05/30/2019 1030   GFRNONAA 102 05/30/2019 1030   GFRAA 118 05/30/2019 1030    Renal function: Estimated Creatinine Clearance: 123.4 mL/min (by C-G formula based on SCr of 0.72 mg/dL).  Clinical ASCVD: No  The 10-year ASCVD risk score  Denman George DC Jr., et al., 2013) is: 13.6%   Values used to calculate the score:     Age: 44 years     Sex: Female     Is Non-Hispanic African American: Yes     Diabetic: No     Tobacco smoker: Yes     Systolic Blood Pressure: 134 mmHg     Is BP treated: Yes     HDL Cholesterol: 32 mg/dL     Total Cholesterol: 185 mg/dL   A/P: Hypertension longstanding currently uncontrolled on current medications. BP Goal = < 130/80 mmHg. BP remains elevated at home and however clinic BP is near goal Patient is adherent to medications however not taking as prescribed. Patient was taking 2 tablets of amlodipine 10 mg for 2 days. Patient also endorses lower extremity swelling immediately after starting amlodipine (taking 1 tablet at the time). Will discontinue Amlodipine due to LE swelling and will initiate Losartan.  -Discontinue Amlodipine  -Start Losartan 50 mg daily -F/u labs ordered today - CBC -F/u BMP at next follow-up visit -Counseled on lifestyle modifications for blood pressure control including reduced dietary sodium, increased exercise, adequate sleep  Results reviewed and written information provided.    Total time in face-to-face counseling 25 minutes.   F/U Clinic Visit in 3 weeks.    Patient seen with:  Fabio Neighbors, PharmD PGY1 Ambulatory Care Resident [facility]  Butch Penny, PharmD, CPP Clinical Pharmacist [facility] [phone]

## 2019-06-13 NOTE — Patient Instructions (Signed)
Thank you for coming to see [place] today.   Blood pressure today is improved.   Stop amlodipine.   Start losartan 50 mg. You'll take 1 tablet daily.   Limiting salt and caffeine, as well as exercising as able for at least 30 minutes for 5 days out of the week, can also help you lower your blood pressure.  Take your blood pressure at home if you are able. Please write down these numbers and bring them to your visits.  If you have any questions about medications, please call me [phone].  Franky Macho

## 2019-06-30 ENCOUNTER — Ambulatory Visit: Payer: Medicaid Other

## 2019-07-04 ENCOUNTER — Ambulatory Visit: Payer: Medicaid Other | Admitting: Pharmacist

## 2019-07-06 MED FILL — MEGESTROL 40 MG TABLET: 40 | 30 days supply | Qty: 15 | Fill #1

## 2019-07-14 MED FILL — LOSARTAN POTASSIUM 50 MG TA: 50 | 30 days supply | Qty: 30 | Fill #1

## 2019-07-18 MED FILL — CYCLOBENZAPRINE 10 MG TAB: 10 | 20 days supply | Qty: 60 | Fill #1

## 2019-07-18 MED FILL — traZODone HCL 50 MG TABS: 50 | 30 days supply | Qty: 30 | Fill #1

## 2019-08-16 MED FILL — MEGESTROL 40 MG TABLET: 40 | 30 days supply | Qty: 15 | Fill #2

## 2019-08-16 MED FILL — IBUPROFEN 600 MG TABLET: 600 | 20 days supply | Qty: 60 | Fill #1

## 2019-08-16 MED FILL — LOSARTAN POTASSIUM 50 MG TA: 50 | 30 days supply | Qty: 30 | Fill #2

## 2019-09-26 ENCOUNTER — Other Ambulatory Visit: Payer: Self-pay | Admitting: Nurse Practitioner

## 2019-09-26 DIAGNOSIS — M545 Low back pain, unspecified: Secondary | ICD-10-CM

## 2019-09-26 DIAGNOSIS — G8929 Other chronic pain: Secondary | ICD-10-CM

## 2019-09-26 MED FILL — traZODone HCL 50 MG TABS: 50 | 30 days supply | Qty: 30 | Fill #2

## 2019-09-26 MED FILL — MEGESTROL 40 MG TABLET: 40 | 30 days supply | Qty: 15 | Fill #3

## 2019-09-26 MED FILL — LOSARTAN POTASSIUM 50 MG TA: 50 | 30 days supply | Qty: 30 | Fill #0

## 2019-09-26 NOTE — Telephone Encounter (Signed)
Requested medication (s) are due for refill today: no  Requested medication (s) are on the active medication list: no  Last refill:  08/16/2019  Future visit scheduled: no  Notes to clinic:  medication not on current medication list Please advise   Requested Prescriptions  Pending Prescriptions Disp Refills   ibuprofen (ADVIL) 600 MG tablet [Pharmacy Med Name: IBUPROFEN 600 MG TABLET 600 Tablet] 60 tablet 1    Sig: TAKE 1 TABLET (600 MG TOTAL) BY MOUTH 3 (THREE) TIMES DAILY.      Analgesics:  NSAIDS Failed - 09/26/2019  2:13 PM      Failed - HGB in normal range and within 360 days    Hemoglobin  Date Value Ref Range Status  05/30/2019 16.3 (H) 11.1 - 15.9 g/dL Final          Passed - Cr in normal range and within 360 days    Creatinine, Ser  Date Value Ref Range Status  05/30/2019 0.72 0.57 - 1.00 mg/dL Final   Creatinine, Urine  Date Value Ref Range Status  03/12/2009 52.9 mg/dL Final          Passed - Patient is not pregnant      Passed - Valid encounter within last 12 months    Recent Outpatient Visits           3 months ago Microcytic hypochromic anemia   [facility] [place], Cornelius Moras, RPH-CPP   3 months ago Essential hypertension   [facility] [place], [place] W, NP   1 year ago Acute right-sided low back pain without sciatica   [facility] [place], Kinnie Scales, NP   1 year ago Encounter for Papanicolaou smear for cervical cancer screening   [facility] [place], Shea Stakes, NP   1 year ago Acute pain of right knee   [facility] [place], Shea Stakes, NP               Signed Prescriptions Disp Refills   losartan (COZAAR) 50 MG tablet 30 tablet 2    Sig: TAKE 1 TABLET (50 MG TOTAL) BY MOUTH DAILY.      Cardiovascular:  Angiotensin Receptor Blockers Failed - 09/26/2019  2:13 PM      Failed - Last BP in normal  range    BP Readings from Last 1 Encounters:  06/13/19 (!) 134/91          Passed - Cr in normal range and within 180 days    Creatinine, Ser  Date Value Ref Range Status  05/30/2019 0.72 0.57 - 1.00 mg/dL Final   Creatinine, Urine  Date Value Ref Range Status  03/12/2009 52.9 mg/dL Final          Passed - K in normal range and within 180 days    Potassium  Date Value Ref Range Status  05/30/2019 3.8 3.5 - 5.2 mmol/L Final          Passed - Patient is not pregnant      Passed - Valid encounter within last 6 months    Recent Outpatient Visits           3 months ago Microcytic hypochromic anemia   [facility] [place], Cornelius Moras, RPH-CPP   3 months ago Essential hypertension   [facility] [place], Shea Stakes, NP   1 year  ago Acute right-sided low back pain without sciatica   [facility] [place], Kinnie Scales, NP   1 year ago Encounter for Papanicolaou smear for cervical cancer screening   [facility] [place] Rigg, NP   1 year ago Acute pain of right knee   [facility] Claiborne Rigg, NP

## 2019-09-27 MED FILL — IBUPROFEN 600 MG TABLET: 600 | 20 days supply | Qty: 60 | Fill #0

## 2019-10-25 MED FILL — MEGESTROL 40 MG TABLET: 40 | 30 days supply | Qty: 15 | Fill #4

## 2019-10-26 ENCOUNTER — Other Ambulatory Visit: Payer: Self-pay | Admitting: Nurse Practitioner

## 2019-10-26 DIAGNOSIS — G8929 Other chronic pain: Secondary | ICD-10-CM

## 2019-10-26 NOTE — Telephone Encounter (Signed)
Requested medication (s) are due for refill today: yes  Requested medication (s) are on the active medication list:yes  Last refill: 05/30/19  #60  1 refill  Future visit scheduled no  Notes to clinic: not delegated  Requested Prescriptions  Pending Prescriptions Disp Refills   cyclobenzaprine (FLEXERIL) 10 MG tablet [Pharmacy Med Name: CYCLOBENZAPRINE 10 MG TAB 10 Tablet] 60 tablet 1    Sig: Take 1 tablet (10 mg total) by mouth 3 (three) times daily as needed for muscle spasms.      Not Delegated - Analgesics:  Muscle Relaxants Failed - 10/26/2019 10:32 AM      Failed - This refill cannot be delegated      Passed - Valid encounter within last 6 months    Recent Outpatient Visits           4 months ago Microcytic hypochromic anemia   [facility] [place], Cornelius Moras, RPH-CPP   4 months ago Essential hypertension   [facility] [place], Shea Stakes, NP   1 year ago Acute right-sided low back pain without sciatica   [facility] [place] Sessions, NP   1 year ago Encounter for Papanicolaou smear for cervical cancer screening   [facility] [place], Shea Stakes, NP   1 year ago Acute pain of right knee   [facility] [place], Shea Stakes, NP

## 2019-10-31 MED FILL — LOSARTAN POTASSIUM 50 MG TA: 50 | 30 days supply | Qty: 30 | Fill #1

## 2019-11-22 MED FILL — MEGESTROL 40 MG TABLET: 40 | 30 days supply | Qty: 15 | Fill #5

## 2019-12-01 MED FILL — LOSARTAN POTASSIUM 50 MG TA: 50 | 30 days supply | Qty: 30 | Fill #2

## 2020-01-03 ENCOUNTER — Other Ambulatory Visit: Payer: Self-pay | Admitting: Nurse Practitioner

## 2020-01-03 MED FILL — MEGESTROL 40 MG TABLET: 40 | 30 days supply | Qty: 15 | Fill #6

## 2020-01-03 MED FILL — LOSARTAN POTASSIUM 50 MG TA: 50 | 30 days supply | Qty: 30 | Fill #0

## 2020-01-31 ENCOUNTER — Other Ambulatory Visit: Payer: Self-pay | Admitting: Nurse Practitioner

## 2020-01-31 MED FILL — traZODone HCL 50 MG TABS: 50 | 30 days supply | Qty: 30 | Fill #3

## 2020-01-31 MED FILL — MEGESTROL 40 MG TABLET: 40 | 30 days supply | Qty: 15 | Fill #7

## 2020-01-31 NOTE — Telephone Encounter (Signed)
Courtesy refill. Contacted patient and earliest appt scheduled for 03/27/2020.

## 2020-02-01 MED FILL — LOSARTAN POTASSIUM 50 MG TA: 50 | 30 days supply | Qty: 30 | Fill #0

## 2020-02-24 MED FILL — MEGESTROL 40 MG TABLET: 40 | 30 days supply | Qty: 15 | Fill #8

## 2020-02-28 ENCOUNTER — Other Ambulatory Visit: Payer: Self-pay | Admitting: Nurse Practitioner

## 2020-02-28 MED FILL — LOSARTAN POTASSIUM 50 MG TA: 50 | 30 days supply | Qty: 30 | Fill #0

## 2020-03-08 ENCOUNTER — Ambulatory Visit
Admission: EM | Admit: 2020-03-08 | Discharge: 2020-03-08 | Disposition: A | Discharge status: Home / Self Care | Payer: Medicaid Other | Attending: Emergency Medicine | Admitting: Emergency Medicine

## 2020-03-08 ENCOUNTER — Other Ambulatory Visit: Payer: Self-pay

## 2020-03-08 DIAGNOSIS — J069 Acute upper respiratory infection, unspecified: Secondary | ICD-10-CM

## 2020-03-08 MED ORDER — CETIRIZINE HCL 10 MG PO TABS: 10.0000 mg | ORAL_TABLET | Freq: Every day | ORAL | 0 refills | Status: AC

## 2020-03-08 MED ORDER — BENZONATATE 100 MG PO CAPS: 100.0000 mg | ORAL_CAPSULE | Freq: Three times a day (TID) | ORAL | 0 refills | Status: DC

## 2020-03-08 MED ORDER — PREDNISONE 20 MG PO TABS: 20.0000 mg | ORAL_TABLET | Freq: Every day | ORAL | 0 refills | Status: DC

## 2020-03-08 MED ORDER — FLUTICASONE PROPIONATE 50 MCG/ACT NA SUSP: 1.0000 | Freq: Every day | NASAL | 0 refills | Status: DC

## 2020-03-08 NOTE — Discharge Instructions (Addendum)

## 2020-03-08 NOTE — ED Triage Notes (Signed)
Patient states she has had a cough, congestion, headache, and intermittent fever since Sunday. Pt states some symptoms have improved slightly. Pt gets covid tested at work on tuesdays and fridays. Pt had a negative covid test last Friday. Pt is aox4 and ambulatory.

## 2020-03-08 NOTE — ED Provider Notes (Signed)
[facility]    CSN: [id number] Arrival date & time: 03/08/20  6295      History   Chief Complaint Chief Complaint  Patient presents with  . Headache    Since sunday  . Nasal Congestion    Since sunday  . Cough    Since sunday    HPI Meagan Nelson is a 44 y.o. female  Presenting for URI symptoms since Sunday.  Endorsing dry cough, nasal congestion, frontal headache, subjective fever since Sunday.  Gets routinely Covid tested at work on Tuesdays and Fridays, though has not been tested since she developed symptoms.  Past Medical History:  Diagnosis Date  . Abscess    facial  . Anemia   . Headache   . History of blood transfusion   . Hypertension    PMH  . Insomnia     Patient Active Problem List   Diagnosis Date Noted  . Morbid obesity with BMI of 40.0-44.9, adult (HCC) 03/26/2018  . Essential hypertension 02/17/2017  . Abnormal uterine bleeding 01/19/2017  . Cellulitis of face   . Symptomatic anemia 09/01/2014  . Microcytic hypochromic anemia 09/01/2014  . Dental infection 09/01/2014    Past Surgical History:  Procedure Laterality Date  . CESAREAN SECTION     C/S x 1  . DILATATION & CURETTAGE/HYSTEROSCOPY WITH MYOSURE N/A 03/11/2017   Procedure: DILATATION & CURETTAGE/HYSTEROSCOPY WITH MYOSURE;  Surgeon: Allie Bossier, MD;  Location: [facility];  Service: Gynecology;  Laterality: N/A;  . DILATION AND CURETTAGE OF UTERUS    . TONSILLECTOMY      OB History    Gravida  7   Para  0   Term      Preterm      AB  1   Living  6     SAB  1   IAB      Ectopic      Multiple      Live Births  5            Home Medications    Prior to Admission medications   Medication Sig Start Date End Date Taking? Authorizing Provider  losartan (COZAAR) 50 MG tablet TAKE 1 TABLET (50 MG TOTAL) BY MOUTH DAILY. 02/28/20  Yes Claiborne Rigg, NP  megestrol (MEGACE) 40 MG tablet Take 0.5 tablets (20 mg total) by mouth daily.  06/10/19  Yes Levie Heritage, DO  amLODipine (NORVASC) 10 MG tablet Take 1 tablet (10 mg total) by mouth daily. 05/30/19   Claiborne Rigg, NP  benzonatate (TESSALON) 100 MG capsule Take 1 capsule (100 mg total) by mouth every 8 (eight) hours. 03/08/20   Hall-Potvin, [place], PA-C  cetirizine (ZYRTEC ALLERGY) 10 MG tablet Take 1 tablet (10 mg total) by mouth daily. 03/08/20   Hall-Potvin, [place], PA-C  cyclobenzaprine (FLEXERIL) 10 MG tablet TAKE 1 TABLET (10 MG TOTAL) BY MOUTH 3 (THREE) TIMES DAILY AS NEEDED FOR MUSCLE SPASMS. 10/27/19   Hoy Register, MD  fluticasone (FLONASE) 50 MCG/ACT nasal spray Place 1 spray into both nostrils daily. 03/08/20   Hall-Potvin, [place], PA-C  predniSONE (DELTASONE) 20 MG tablet Take 1 tablet (20 mg total) by mouth daily. 03/08/20   Hall-Potvin, [place], PA-C  traZODone (DESYREL) 50 MG tablet Take 1 tablet (50 mg total) by mouth at bedtime as needed for sleep. 05/30/19 08/28/19  Claiborne Rigg, NP    Family History Family History  Problem Relation Age of Onset  . Hypertension Mother   .  Anemia Sister   . Anemia Brother   . Anemia Sister   . Anemia Other   . Hypertension Other     Social History Social History   Tobacco Use  . Smoking status: Current Some Day Smoker    Packs/day: 0.10    Types: Cigarettes  . Smokeless tobacco: Never Used  Vaping Use  . Vaping Use: Never used  Substance Use Topics  . Alcohol use: Yes    Comment: ocassinally   . Drug use: No     Allergies   Naprosyn [naproxen] and Amlodipine   Review of Systems Review of Systems  Constitutional: Positive for fatigue and fever.  HENT: Positive for congestion. Negative for dental problem, ear pain, facial swelling, hearing loss, sinus pain, sore throat, trouble swallowing and voice change.   Eyes: Negative for photophobia, pain and visual disturbance.  Respiratory: Positive for cough. Negative for shortness of breath.   Cardiovascular: Negative for chest pain and  palpitations.  Gastrointestinal: Negative for diarrhea and vomiting.  Musculoskeletal: Negative for arthralgias and myalgias.  Neurological: Negative for dizziness and headaches.     Physical Exam Triage Vital Signs ED Triage Vitals [03/08/20 0836]  Enc Vitals Group     BP      Pulse Rate (!) 114     Resp 20     Temp 98.2 F (36.8 C)     Temp Source Oral     SpO2 96 %     Weight      Height      Head Circumference      Peak Flow      Pain Score      Pain Loc      Pain Edu?      Excl. in GC?    No data found.  Updated Vital Signs Pulse (!) 114   Temp 98.2 F (36.8 C) (Oral)   Resp 20   SpO2 96%   Visual Acuity Right Eye Distance:   Left Eye Distance:   Bilateral Distance:    Right Eye Near:   Left Eye Near:    Bilateral Near:     Physical Exam Constitutional:      General: She is not in acute distress.    Appearance: She is not ill-appearing or diaphoretic.  HENT:     Head: Normocephalic and atraumatic.     Right Ear: Tympanic membrane and ear canal normal.     Left Ear: Tympanic membrane and ear canal normal.     Mouth/Throat:     Mouth: Mucous membranes are moist.     Pharynx: Oropharynx is clear. No oropharyngeal exudate or posterior oropharyngeal erythema.  Eyes:     General: No scleral icterus.    Conjunctiva/sclera: Conjunctivae normal.     Pupils: Pupils are equal, round, and reactive to light.  Neck:     Comments: Trachea midline, negative JVD Cardiovascular:     Rate and Rhythm: Normal rate and regular rhythm.     Heart sounds: No murmur heard. No gallop.   Pulmonary:     Effort: Pulmonary effort is normal. No respiratory distress.     Breath sounds: No wheezing, rhonchi or rales.  Musculoskeletal:     Cervical back: Neck supple. No tenderness.  Lymphadenopathy:     Cervical: No cervical adenopathy.  Skin:    Capillary Refill: Capillary refill takes less than 2 seconds.     Coloration: Skin is not jaundiced or pale.     Findings: No  rash.  Neurological:     General: No focal deficit present.     Mental Status: She is alert and oriented to person, place, and time.      UC Treatments / Results  Labs (all labs ordered are listed, but only abnormal results are displayed) Labs Reviewed  NOVEL CORONAVIRUS, NAA    EKG   Radiology No results found.  Procedures Procedures (including critical care time)  Medications Ordered in UC Medications - No data to display  Initial Impression / Assessment and Plan / UC Course  I have reviewed the triage vital signs and the nursing notes.  Pertinent labs & imaging results that were available during my care of the patient were reviewed by me and considered in my medical decision making (see chart for details).     Patient afebrile, nontoxic, with SpO2 96%.  Covid PCR pending.  Patient to quarantine until results are back.  We will treat supportively as outlined below.  Return precautions discussed, patient verbalized understanding and is agreeable to plan. Final Clinical Impressions(s) / UC Diagnoses   Final diagnoses:  URI with cough and congestion     Discharge Instructions     Tessalon for cough. Start flonase, atrovent nasal spray for nasal congestion/drainage. You can use over the counter nasal saline rinse such as neti pot for nasal congestion. Keep hydrated, your urine should be clear to pale yellow in color. Tylenol/motrin for fever and pain. Monitor for any worsening of symptoms, chest pain, shortness of breath, wheezing, swelling of the throat, go to the emergency department for further evaluation needed.     ED Prescriptions    Medication Sig Dispense Auth. Provider   predniSONE (DELTASONE) 20 MG tablet Take 1 tablet (20 mg total) by mouth daily. 5 tablet Hall-Potvin, [place], PA-C   benzonatate (TESSALON) 100 MG capsule Take 1 capsule (100 mg total) by mouth every 8 (eight) hours. 21 capsule Hall-Potvin, [place], PA-C   cetirizine (ZYRTEC ALLERGY) 10  MG tablet Take 1 tablet (10 mg total) by mouth daily. 30 tablet Hall-Potvin, [place], PA-C   fluticasone (FLONASE) 50 MCG/ACT nasal spray Place 1 spray into both nostrils daily. 16 g Hall-Potvin, [place], PA-C     PDMP not reviewed this encounter.   Odette Fraction [place], [place] 03/08/20 [phone]

## 2020-03-10 LAB — SARS-COV-2, NAA 2 DAY TAT

## 2020-03-10 LAB — NOVEL CORONAVIRUS, NAA: SARS-CoV-2, NAA: NOT DETECTED

## 2020-03-22 ENCOUNTER — Other Ambulatory Visit: Payer: Self-pay | Admitting: Nurse Practitioner

## 2020-03-22 MED FILL — MEGESTROL 40 MG TABLET: 40 | 30 days supply | Qty: 15 | Fill #9

## 2020-03-23 ENCOUNTER — Other Ambulatory Visit: Payer: Self-pay | Admitting: Nurse Practitioner

## 2020-03-23 MED FILL — LOSARTAN POTASSIUM 50 MG TA: 50 | 30 days supply | Qty: 30 | Fill #0

## 2020-03-27 ENCOUNTER — Other Ambulatory Visit: Payer: Self-pay

## 2020-03-27 ENCOUNTER — Ambulatory Visit: Payer: Self-pay | Attending: Nurse Practitioner | Admitting: Nurse Practitioner

## 2020-03-27 ENCOUNTER — Other Ambulatory Visit: Payer: Self-pay | Admitting: Nurse Practitioner

## 2020-03-27 ENCOUNTER — Encounter: Payer: Self-pay | Admitting: Nurse Practitioner

## 2020-03-27 VITALS — BP 183/139 | HR 94 | Temp 98.2°F | Ht 66.0 in | Wt 269.0 lb

## 2020-03-27 DIAGNOSIS — Z6841 Body Mass Index (BMI) 40.0 and over, adult: Secondary | ICD-10-CM

## 2020-03-27 DIAGNOSIS — G8929 Other chronic pain: Secondary | ICD-10-CM

## 2020-03-27 DIAGNOSIS — M25561 Pain in right knee: Secondary | ICD-10-CM

## 2020-03-27 DIAGNOSIS — I10 Essential (primary) hypertension: Secondary | ICD-10-CM

## 2020-03-27 DIAGNOSIS — N939 Abnormal uterine and vaginal bleeding, unspecified: Secondary | ICD-10-CM

## 2020-03-27 MED ORDER — MELOXICAM 15 MG PO TABS: 15.0000 mg | ORAL_TABLET | Freq: Every day | ORAL | 1 refills | Status: DC

## 2020-03-27 MED ORDER — LOSARTAN POTASSIUM 100 MG PO TABS: 100.0000 mg | ORAL_TABLET | Freq: Every day | ORAL | 1 refills | Status: DC

## 2020-03-27 MED ORDER — CLONIDINE HCL 0.1 MG PO TABS
0.2000 mg | ORAL_TABLET | Freq: Once | ORAL | Status: AC
  Administered 2020-03-27: 0.2 mg via ORAL

## 2020-03-27 MED FILL — MELOXICAM 15 MG TABLET: 15 | 30 days supply | Qty: 30 | Fill #0

## 2020-03-27 MED FILL — LOSARTAN POTASSIUM 100 MG T: 100 | 30 days supply | Qty: 30 | Fill #0

## 2020-03-27 NOTE — Progress Notes (Signed)
Assessment & Plan:  Meagan Nelson was seen today for hypertension.  Diagnoses and all orders for this visit:  Essential hypertension -     cloNIDine (CATAPRES) tablet 0.2 mg -     losartan (COZAAR) 100 MG tablet; Take 1 tablet (100 mg total) by mouth daily. -     CMP14+EGFR Continue all antihypertensives as prescribed.  Remember to bring in your blood pressure log with you for your follow up appointment.  DASH/Mediterranean Diets are healthier choices for HTN.    Chronic pain of right knee -     DG Knee Complete 4 Views Right; Future -     Uric Acid -     meloxicam (MOBIC) 15 MG tablet; Take 1 tablet (15 mg total) by mouth daily with breakfast. Make sure you take with food Work on losing weight to help reduce joint pain. May alternate with heat and ice application for pain relief. May also alternate with acetaminophen as prescribed pain relief. Other alternatives include massage, acupuncture and water aerobics.  You must stay active and avoid a sedentary lifestyle.  Abnormal uterine bleeding -     Ambulatory referral to Gynecology -     CBC  Morbid obesity with BMI of 45.0-49.9, adult (HCC) -     Lipid panel Discussed diet and exercise for person with BMI >45. Instructed: You must burn more calories than you eat. Losing 5 percent of your body weight should be considered a success. In the longer term, losing more than 15 percent of your body weight and staying at this weight is an extremely good result. However, keep in mind that even losing 5 percent of your body weight leads to important health benefits, so try not to get discouraged if you're not able to lose more than this. Will recheck weight in 3-6 months.    Patient has been counseled on age-appropriate routine health concerns for screening and prevention. These are reviewed and up-to-date. Referrals have been placed accordingly. Immunizations are up-to-date or declined.    Subjective:   Chief Complaint  Patient presents with   . Hypertension    Pt. Is here for blood pressure follow up.    HPI Meagan Nelson 45 y.o. female presents to office today for follow-up.  She has a history of hypertension and abnormal uterine bleeding with anemia.   ESSENTIAL HYPERTENSION She was previously taking amlodipine 10 mg however she stopped taking it last year after noticing bilateral lower extremity edema and experiencing headaches.  She was previously on hydrochlorothiazide however that caused hypokalemia.  She was started on losartan 50 mg June 13, 2019 by the pharmacist and instructed to follow-up for repeat blood pressure check 3 weeks later however she was a no-show for that appointment.  Today blood pressures are elevated and she required clonidine here in the office.  We will increase losartan to 100 mg and have her follow-up in a few weeks for blood pressure check.  If still elevated and heart rate continues in the 90s we likely could start her on carvedilol 3.125 mg twice daily. Denies chest pain, shortness of breath, palpitations, lightheadedness, dizziness, headaches or BLE edema.  She does endorse increased stress and is unhappy with her weight.  I did instruct her that she has lost 15 pounds since her last office visit. BP Readings from Last 3 Encounters:  03/27/20 (!) 183/139  06/13/19 (!) 134/91  06/10/19 (!) 155/110    KNEE PAIN Right knee pain and swelling. Had joint aspiration 12-23-2017. She  was instructed to follow up with ortho however she was lost to follow up. Today she reports her current symptoms are the same as when she had the right knee effusion   AUB Wants to come off megace due to weight. She will need to follow up with GYN regarding hysterectomy that she is interested in having         Review of Systems  Constitutional: Negative for fever, malaise/fatigue and weight loss.  HENT: Negative.  Negative for nosebleeds.   Eyes: Negative.  Negative for blurred vision, double vision and photophobia.   Respiratory: Negative.  Negative for cough and shortness of breath.   Cardiovascular: Negative.  Negative for chest pain, palpitations and leg swelling.  Gastrointestinal: Negative.  Negative for heartburn, nausea and vomiting.  Musculoskeletal: Positive for joint pain. Negative for myalgias.  Neurological: Negative.  Negative for dizziness, focal weakness, seizures and headaches.  Psychiatric/Behavioral: Negative.  Negative for suicidal ideas.    Past Medical History:  Diagnosis Date  . Abscess    facial  . Anemia   . Headache   . History of blood transfusion   . Hypertension    PMH  . Insomnia     Past Surgical History:  Procedure Laterality Date  . CESAREAN SECTION     C/S x 1  . DILATATION & CURETTAGE/HYSTEROSCOPY WITH MYOSURE N/A 03/11/2017   Procedure: DILATATION & CURETTAGE/HYSTEROSCOPY WITH MYOSURE;  Surgeon: Emily Filbert, MD;  Location: [place];  Service: Gynecology;  Laterality: N/A;  . DILATION AND CURETTAGE OF UTERUS    . TONSILLECTOMY      Family History  Problem Relation Age of Onset  . Hypertension Mother   . Anemia Sister   . Anemia Brother   . Anemia Sister   . Anemia Other   . Hypertension Other     Social History Reviewed with no changes to be made today.   Outpatient Medications Prior to Visit  Medication Sig Dispense Refill  . benzonatate (TESSALON) 100 MG capsule Take 1 capsule (100 mg total) by mouth every 8 (eight) hours. 21 capsule 0  . cetirizine (ZYRTEC ALLERGY) 10 MG tablet Take 1 tablet (10 mg total) by mouth daily. 30 tablet 0  . cyclobenzaprine (FLEXERIL) 10 MG tablet TAKE 1 TABLET (10 MG TOTAL) BY MOUTH 3 (THREE) TIMES DAILY AS NEEDED FOR MUSCLE SPASMS. 60 tablet 1  . megestrol (MEGACE) 40 MG tablet Take 0.5 tablets (20 mg total) by mouth daily. 45 tablet 3  . losartan (COZAAR) 50 MG tablet TAKE 1 TABLET (50 MG TOTAL) BY MOUTH DAILY. 30 tablet 0  . predniSONE (DELTASONE) 20 MG tablet Take 1 tablet (20 mg total) by  mouth daily. 5 tablet 0  . traZODone (DESYREL) 50 MG tablet Take 1 tablet (50 mg total) by mouth at bedtime as needed for sleep. 90 tablet 1  . amLODipine (NORVASC) 10 MG tablet Take 1 tablet (10 mg total) by mouth daily. (Patient not taking: Reported on 03/27/2020) 30 tablet 2  . fluticasone (FLONASE) 50 MCG/ACT nasal spray Place 1 spray into both nostrils daily. (Patient not taking: Reported on 03/27/2020) 16 g 0   No facility-administered medications prior to visit.    Allergies  Allergen Reactions  . Naprosyn [Naproxen] Nausea And Vomiting  . Amlodipine Swelling       Objective:    BP (!) 183/139 (BP Location: Left Arm, Patient Position: Sitting, Cuff Size: Large)   Pulse 94   Temp 98.2 F (36.8 C) (  Oral)   Ht $R'5\' 6"'tF$  (1.676 m)   Wt 269 lb (122 kg)   SpO2 98%   BMI 43.42 kg/m  Wt Readings from Last 3 Encounters:  03/27/20 269 lb (122 kg)  06/10/19 284 lb (128.8 kg)  05/30/19 289 lb (131.1 kg)    Physical Exam Vitals and nursing note reviewed.  Constitutional:      Appearance: She is well-developed and well-nourished.  HENT:     Head: Normocephalic and atraumatic.  Eyes:     Extraocular Movements: EOM normal.  Cardiovascular:     Rate and Rhythm: Normal rate and regular rhythm.     Pulses: Intact distal pulses.     Heart sounds: Normal heart sounds. No murmur heard. No friction rub. No gallop.   Pulmonary:     Effort: Pulmonary effort is normal. No tachypnea or respiratory distress.     Breath sounds: Normal breath sounds. No decreased breath sounds, wheezing, rhonchi or rales.  Chest:     Chest wall: No tenderness.  Abdominal:     General: Bowel sounds are normal.     Palpations: Abdomen is soft.  Musculoskeletal:        General: Swelling present. No edema.     Cervical back: Normal range of motion.     Right knee: Swelling present. No effusion. Decreased range of motion. Tenderness present.  Skin:    General: Skin is warm and dry.  Neurological:     Mental  Status: She is alert and oriented to person, place, and time.     Coordination: Coordination normal.  Psychiatric:        Mood and Affect: Mood and affect normal.        Behavior: Behavior normal. Behavior is cooperative.        Thought Content: Thought content normal.        Judgment: Judgment normal.          Patient has been counseled extensively about nutrition and exercise as well as the importance of adherence with medications and regular follow-up. The patient was given clear instructions to go to ER or return to medical center if symptoms don't improve, worsen or new problems develop. The patient verbalized understanding.   Follow-up: Return in about 2 weeks (around 04/10/2020) for BP CHECK WITH LUKE in 2 weeks. Gildardo Pounds, FNP-BC [facility] [place], [place]   03/27/2020, 2:04 PM

## 2020-03-28 LAB — CBC
Hematocrit: 47.1 % — ABNORMAL HIGH (ref 34.0–46.6)
Hemoglobin: 15.9 g/dL (ref 11.1–15.9)
MCH: 30.5 pg (ref 26.6–33.0)
MCHC: 33.8 g/dL (ref 31.5–35.7)
MCV: 90 fL (ref 79–97)
Platelets: 238 10*3/uL (ref 150–450)
RBC: 5.22 x10E6/uL (ref 3.77–5.28)
RDW: 14.6 % (ref 11.7–15.4)
WBC: 12.2 10*3/uL — ABNORMAL HIGH (ref 3.4–10.8)

## 2020-03-28 LAB — CMP14+EGFR
ALT: 20 IU/L (ref 0–32)
AST: 20 IU/L (ref 0–40)
Albumin/Globulin Ratio: 1.6 (ref 1.2–2.2)
Albumin: 4.3 g/dL (ref 3.8–4.8)
Alkaline Phosphatase: 72 IU/L (ref 44–121)
BUN/Creatinine Ratio: 15 (ref 9–23)
BUN: 9 mg/dL (ref 6–24)
Bilirubin Total: 0.5 mg/dL (ref 0.0–1.2)
CO2: 29 mmol/L (ref 20–29)
Calcium: 9.9 mg/dL (ref 8.7–10.2)
Chloride: 98 mmol/L (ref 96–106)
Creatinine, Ser: 0.59 mg/dL (ref 0.57–1.00)
GFR calc Af Amer: 129 mL/min/{1.73_m2} (ref >59)
GFR calc non Af Amer: 112 mL/min/{1.73_m2} (ref >59)
Globulin, Total: 2.7 g/dL (ref 1.5–4.5)
Glucose: 94 mg/dL (ref 65–99)
Potassium: 3.9 mmol/L (ref 3.5–5.2)
Sodium: 141 mmol/L (ref 134–144)
Total Protein: 7 g/dL (ref 6.0–8.5)

## 2020-03-28 LAB — LIPID PANEL
Chol/HDL Ratio: 5.7 ratio — ABNORMAL HIGH (ref 0.0–4.4)
Cholesterol, Total: 222 mg/dL — ABNORMAL HIGH (ref 100–199)
HDL: 39 mg/dL — ABNORMAL LOW (ref >39)
LDL Chol Calc (NIH): 157 mg/dL — ABNORMAL HIGH (ref 0–99)
Triglycerides: 145 mg/dL (ref 0–149)
VLDL Cholesterol Cal: 26 mg/dL (ref 5–40)

## 2020-03-28 LAB — URIC ACID: Uric Acid: 7.4 mg/dL — ABNORMAL HIGH (ref 2.6–6.2)

## 2020-04-05 ENCOUNTER — Other Ambulatory Visit: Payer: Self-pay | Admitting: Nurse Practitioner

## 2020-04-05 MED ORDER — ATORVASTATIN CALCIUM 20 MG PO TABS: 20.0000 mg | ORAL_TABLET | Freq: Every day | ORAL | 3 refills | Status: DC

## 2020-04-05 MED FILL — ATORVASTATIN CALCIUM 20 MG: 20 | 30 days supply | Qty: 30 | Fill #0

## 2020-04-05 NOTE — Progress Notes (Signed)
The 10-year ASCVD risk score Denman George DC Montez Hageman., et al., 2013) is: 42.2%   Values used to calculate the score:     Age: 45 years     Sex: Female     Is Non-Hispanic African American: Yes     Diabetic: No     Tobacco smoker: Yes     Systolic Blood Pressure: 183 mmHg     Is BP treated: Yes     HDL Cholesterol: 39 mg/dL     Total Cholesterol: 222 mg/dL

## 2020-04-10 ENCOUNTER — Other Ambulatory Visit: Payer: Self-pay | Admitting: Family Medicine

## 2020-04-10 ENCOUNTER — Encounter: Payer: Self-pay | Admitting: Pharmacist

## 2020-04-10 ENCOUNTER — Other Ambulatory Visit: Payer: Self-pay

## 2020-04-10 ENCOUNTER — Ambulatory Visit: Payer: Medicaid Other | Attending: Nurse Practitioner | Admitting: Pharmacist

## 2020-04-10 VITALS — BP 162/115 | HR 96

## 2020-04-10 DIAGNOSIS — I10 Essential (primary) hypertension: Secondary | ICD-10-CM

## 2020-04-10 MED ORDER — CARVEDILOL 3.125 MG PO TABS: 3.1250 mg | ORAL_TABLET | Freq: Two times a day (BID) | ORAL | 2 refills | Status: DC

## 2020-04-10 MED FILL — CARVEDILOL 3.125 MG TABLET: 3.125 | 30 days supply | Qty: 60 | Fill #0

## 2020-04-10 NOTE — Progress Notes (Signed)
   S:    Patient arrives well and in good spirits.    Presents to the clinic for hypertension evaluation, counseling, and management. Patient was referred and last seen by Primary Care Provider, Bertram Denver, on 03/27/2020. At this visit, losartan was increased to 100 mg daily.   Medication adherence appropriate.  Current BP Medications include:  Losartan 100 mg daily  Antihypertensives tried in the past include: amlodipine (lower extremity swelling), hctz (hypokalemia)  Dietary habits include: denies caffeine intake, denies coffee, denies soda  Exercise habits include: none reported    O:  Vitals:   04/10/20 1452  BP: (!) 162/115  Pulse: 96    Home BP readings: subjectively reported a systolic reading in the 130s, noted that a couple times she got an "error"  Last 3 Office BP readings: BP Readings from Last 3 Encounters:  04/10/20 (!) 162/115  03/27/20 (!) 183/139  06/13/19 (!) 134/91    BMET    Component Value Date/Time   NA 141 03/27/2020 1155   K 3.9 03/27/2020 1155   CL 98 03/27/2020 1155   CO2 29 03/27/2020 1155   GLUCOSE 94 03/27/2020 1155   GLUCOSE 98 03/11/2017 1342   BUN 9 03/27/2020 1155   CREATININE 0.59 03/27/2020 1155   CALCIUM 9.9 03/27/2020 1155   GFRNONAA 112 03/27/2020 1155   GFRAA 129 03/27/2020 1155    Renal function: CrCl cannot be calculated (Unknown ideal weight.).  Clinical ASCVD: No  The 10-year ASCVD risk score Denman George DC Jr., et al., 2013) is: 25.9%   Values used to calculate the score:     Age: 45 years     Sex: Female     Is Non-Hispanic African American: Yes     Diabetic: No     Tobacco smoker: Yes     Systolic Blood Pressure: 162 mmHg     Is BP treated: Yes     HDL Cholesterol: 39 mg/dL     Total Cholesterol: 222 mg/dL    A/P: Hypertension longstanding currently uncontrolled on current medications. BP Goal = < 130/80 mmHg. Medication adherence appropriate. Hypertension historically controlled with hctz, however, consistent  hypokalemia occurred while on HCTZ. Initial BP significantly elevated at 162/115 mmHg, with repeat resting BP of 156/101 mmHg. Considered beta-blocker vs spironolactone; decided to initiate low dose carvedilol due to borderline tachycardia. Will follow up in one month to determine titration requirements.  -Continued losartan 100 mg once daily. -Started carvedilol 3.125 mg BID.   -Counseled on lifestyle modifications for blood pressure control including reduced dietary sodium, increased exercise, adequate sleep.  Results reviewed and written information provided.   Total time in face-to-face counseling 15 minutes.   F/U Clinic Visit in 1 month.  Patient seen with Jamyla Ard.  Theodis Sato, PharmD PGY-1 [facility] Resident  04/10/2020 2:58 PM

## 2020-04-23 MED FILL — MEGESTROL 40 MG TABLET: 40 | 30 days supply | Qty: 15 | Fill #10

## 2020-04-30 ENCOUNTER — Other Ambulatory Visit: Payer: Self-pay | Admitting: Nurse Practitioner

## 2020-04-30 DIAGNOSIS — Z1211 Encounter for screening for malignant neoplasm of colon: Secondary | ICD-10-CM

## 2020-04-30 DIAGNOSIS — D509 Iron deficiency anemia, unspecified: Secondary | ICD-10-CM

## 2020-04-30 DIAGNOSIS — E790 Hyperuricemia without signs of inflammatory arthritis and tophaceous disease: Secondary | ICD-10-CM

## 2020-04-30 DIAGNOSIS — Z1159 Encounter for screening for other viral diseases: Secondary | ICD-10-CM

## 2020-05-01 ENCOUNTER — Other Ambulatory Visit: Payer: Medicaid Other

## 2020-05-03 MED FILL — ATORVASTATIN CALCIUM 20 MG: 20 | 30 days supply | Qty: 30 | Fill #1

## 2020-05-10 ENCOUNTER — Other Ambulatory Visit: Payer: Self-pay

## 2020-05-10 ENCOUNTER — Other Ambulatory Visit: Payer: Self-pay | Admitting: Family Medicine

## 2020-05-10 ENCOUNTER — Ambulatory Visit: Payer: Self-pay | Attending: Nurse Practitioner | Admitting: Pharmacist

## 2020-05-10 ENCOUNTER — Encounter: Payer: Self-pay | Admitting: Pharmacist

## 2020-05-10 VITALS — BP 173/136 | HR 87

## 2020-05-10 DIAGNOSIS — I10 Essential (primary) hypertension: Secondary | ICD-10-CM

## 2020-05-10 MED ORDER — CARVEDILOL 6.25 MG PO TABS
6.2500 mg | ORAL_TABLET | Freq: Two times a day (BID) | ORAL | 2 refills | Status: DC
Start: 2020-05-10 — End: 2020-05-31

## 2020-05-10 MED ORDER — AMLODIPINE BESYLATE 5 MG PO TABS: 5.0000 mg | ORAL_TABLET | Freq: Every day | ORAL | 3 refills | Status: DC

## 2020-05-10 MED FILL — CARVEDILOL 6.25 MG TABLET: 6.25 | 30 days supply | Qty: 60 | Fill #0

## 2020-05-10 MED FILL — LOSARTAN POTASSIUM 100 MG T: 100 | 30 days supply | Qty: 30 | Fill #1

## 2020-05-10 MED FILL — AMLODIPINE BESYLATE 5 MG TA: 5 | 30 days supply | Qty: 30 | Fill #0

## 2020-05-10 NOTE — Progress Notes (Signed)
   S:    PCP: Meagan Nelson  Patient arrives well and in good spirits.  Presents to the clinic for hypertension evaluation, counseling, and management. Patient was referred and last seen by PCP on 03/27/2020. We saw her on 04/10/2020 and added her carvedilol.  She denies HA, blurred vision, dyspnea, or chest pain. Denies racing or pounding heart beat. Her PCP is not in clinic today.   Medication adherence: fair. Pt has been taking her medications but admits to taking carvedilol once daily instead of BID as prescribed. She has taken both losartan and carvedilol today.   Current BP Medications include:  Carvedilol 3.125 mg BID, Losartan 100 mg daily  Antihypertensives tried in the past include: amlodipine (lower extremity swelling with the 10 mg dose), hctz (hypokalemia)  Dietary habits include: drank 1 soda today at work, reports compliance with salt restriction Exercise habits include: none reported   O:  Vitals:   05/10/20 1350  BP: (!) 173/136  Pulse: 87   Home BP readings: has not checked   Last 3 Office BP readings: BP Readings from Last 3 Encounters:  05/10/20 (!) 173/136  04/10/20 (!) 162/115  03/27/20 (!) 183/139   BMET    Component Value Date/Time   NA 141 03/27/2020 1155   K 3.9 03/27/2020 1155   CL 98 03/27/2020 1155   CO2 29 03/27/2020 1155   GLUCOSE 94 03/27/2020 1155   GLUCOSE 98 03/11/2017 1342   BUN 9 03/27/2020 1155   CREATININE 0.59 03/27/2020 1155   CALCIUM 9.9 03/27/2020 1155   GFRNONAA 112 03/27/2020 1155   GFRAA 129 03/27/2020 1155   Renal function: CrCl cannot be calculated (Patient's most recent lab result is older than the maximum 21 days allowed.).  Clinical ASCVD: No  The 10-year ASCVD risk score Denman George DC Jr., et al., 2013) is: 34.2%   Values used to calculate the score:     Age: 45 years     Sex: Female     Is Non-Hispanic African American: Yes     Diabetic: No     Tobacco smoker: Yes     Systolic Blood Pressure: 173 mmHg     Is BP treated:  Yes     HDL Cholesterol: 39 mg/dL     Total Cholesterol: 222 mg/dL  A/P: Hypertension longstanding currently uncontrolled on current medications. BP Goal = < 130/80 mmHg. Medication adherence appropriate. Hypertension historically controlled with hctz, however, consistent hypokalemia occurred while on HCTZ. In addition, her UA levels are elevated. She experienced edema with the higher dose of amlodipine. Given today's BP, I will restart the amlodipine but at a lower dose. I confirmed that she did not experience edema with amlodipine at the 5mg  dose.  -Start amlodipine 5 mg daily.  -Increased carvedilol to 6.25 mg BID. -Continued losartan 100 mg daily.   -Counseled on lifestyle modifications for blood pressure control including reduced dietary sodium, increased exercise, adequate sleep.  Results reviewed and written information provided. Total time in face-to-face counseling 15 minutes.   F/U Clinic Visit in 2-3 weeks.   , PharmD, Butch Penny, CPP Clinical Pharmacist [facility] & [facility] [phone]

## 2020-05-17 ENCOUNTER — Encounter: Payer: Medicaid Other | Admitting: Obstetrics and Gynecology

## 2020-05-18 MED FILL — MEGESTROL 40 MG TABLET: 40 | 30 days supply | Qty: 15 | Fill #11

## 2020-05-31 ENCOUNTER — Encounter (INDEPENDENT_AMBULATORY_CARE_PROVIDER_SITE_OTHER): Payer: Self-pay

## 2020-05-31 ENCOUNTER — Other Ambulatory Visit: Payer: Self-pay

## 2020-05-31 ENCOUNTER — Ambulatory Visit: Payer: Self-pay | Attending: Nurse Practitioner | Admitting: Pharmacist

## 2020-05-31 ENCOUNTER — Encounter: Payer: Self-pay | Admitting: Pharmacist

## 2020-05-31 ENCOUNTER — Other Ambulatory Visit: Payer: Self-pay | Admitting: Family Medicine

## 2020-05-31 VITALS — BP 174/114 | HR 81

## 2020-05-31 DIAGNOSIS — I10 Essential (primary) hypertension: Secondary | ICD-10-CM

## 2020-05-31 MED ORDER — CARVEDILOL 12.5 MG PO TABS: 12.5000 mg | ORAL_TABLET | Freq: Two times a day (BID) | ORAL | 2 refills | Status: DC

## 2020-05-31 NOTE — Progress Notes (Signed)
   S:    PCP: [name]  Patient arrives well and in good spirits.  Presents to the clinic for hypertension evaluation, counseling, and management. Patient was referred and last seen by PCP on 03/27/2020. We saw her on 05/10/20 and added amlodipine. Additionally, we increased her carvedilol dose.   She denies HA, blurred vision, dyspnea, or chest pain. Denies racing or pounding heart beat. Her PCP is not in clinic today.   Medication adherence: reported. She has not missed any doses. Of note, she is taking carvedilol BID now instead of daily.  She denies LE edema with amlodipine 5 mg daily.   Current BP Medications include:  Amlodipine 5 mg daily, carvedilol 6.25 mg BID, Losartan 100 mg daily  Dietary habits include: drank 1 soda today at work, reports compliance with salt restriction Exercise habits include: none reported   O:  Vitals:   05/31/20 1426  BP: (!) 174/114  Pulse: 81    Home BP readings:  - Checked once at work. Gives reading of 155/99.   Last 3 Office BP readings: BP Readings from Last 3 Encounters:  05/31/20 (!) 174/114  05/10/20 (!) 173/136  04/10/20 (!) 162/115   BMET    Component Value Date/Time   NA 141 03/27/2020 1155   K 3.9 03/27/2020 1155   CL 98 03/27/2020 1155   CO2 29 03/27/2020 1155   GLUCOSE 94 03/27/2020 1155   GLUCOSE 98 03/11/2017 1342   BUN 9 03/27/2020 1155   CREATININE 0.59 03/27/2020 1155   CALCIUM 9.9 03/27/2020 1155   GFRNONAA 112 03/27/2020 1155   GFRAA 129 03/27/2020 1155   Renal function: CrCl cannot be calculated (Patient's most recent lab result is older than the maximum 21 days allowed.).  Clinical ASCVD: No  The 10-year ASCVD risk score Denman George DC Jr., et al., 2013) is: 35%   Values used to calculate the score:     Age: 45 years     Sex: Female     Is Non-Hispanic African American: Yes     Diabetic: No     Tobacco smoker: Yes     Systolic Blood Pressure: 174 mmHg     Is BP treated: Yes     HDL Cholesterol: 39 mg/dL      Total Cholesterol: 222 mg/dL  A/P: Hypertension longstanding currently uncontrolled on current medications. Very little improvement today compared to last visit. BP Goal = < 130/80 mmHg. Medication adherence appropriate. -Increase carvedilol to 12.5 mg BID -Increase amlodipine to 10 mg daily. Pt to take two 5 mg tablets daily for several days. She will call us Monday and let us know if she develops LE edema with the 10 mg dose. If she does we will decrease back to 5 mg daily. If not, we will continue with the 10 mg daily dose.  -Counseled on lifestyle modifications for blood pressure control including reduced dietary sodium, increased exercise, adequate sleep.  Results reviewed and written information provided. Total time in face-to-face counseling 15 minutes.   F/U Clinic Visit in 2 weeks.   Butch Penny, PharmD, [place], CPP Clinical Pharmacist [facility] [phone]

## 2020-06-04 ENCOUNTER — Other Ambulatory Visit: Payer: Self-pay | Admitting: Nurse Practitioner

## 2020-06-04 ENCOUNTER — Other Ambulatory Visit: Payer: Self-pay | Admitting: Pharmacist

## 2020-06-04 DIAGNOSIS — F5101 Primary insomnia: Secondary | ICD-10-CM

## 2020-06-04 MED ORDER — AMLODIPINE BESYLATE 10 MG PO TABS: 10.0000 mg | ORAL_TABLET | Freq: Every day | ORAL | 2 refills | Status: DC

## 2020-06-04 MED FILL — ATORVASTATIN CALCIUM 20 MG: 20 | 30 days supply | Qty: 30 | Fill #2

## 2020-06-04 NOTE — Progress Notes (Signed)
Patient called to let me know that her lower legs are not swelling since we increased her to the 10 mg dose of amlodipine on 05/31/2020. Will send in updated rx for 10 mg daily.

## 2020-06-04 NOTE — Telephone Encounter (Signed)
   Notes to clinic:  Script has expired  Review for continue use and new script    Requested Prescriptions  Pending Prescriptions Disp Refills   traZODone (DESYREL) 50 MG tablet [Pharmacy Med Name: traZODone HCL 50 MG TABS 50 Tablet] 30 tablet 1    Sig: Take 1 tablet (50 mg total) by mouth at bedtime as needed for sleep.      Psychiatry: Antidepressants - Serotonin Modulator Passed - 06/04/2020  9:04 AM      Passed - Valid encounter within last 6 months    Recent Outpatient Visits           4 days ago Essential hypertension   [facility] Lois Huxley, Cornelius Moras, RPH-CPP   3 weeks ago Essential hypertension   [facility] Lois Huxley, Cornelius Moras, RPH-CPP   1 month ago Essential hypertension   [facility] Drucilla Chalet, RPH-CPP   2 months ago Essential hypertension   [facility] Claiborne Rigg, NP   11 months ago Microcytic hypochromic anemia   [facility] Drucilla Chalet, RPH-CPP       Future Appointments             In 1 week Lois Huxley, Cornelius Moras, RPH-CPP [facility]   In 3 weeks Claiborne Rigg, NP [facility]

## 2020-06-05 ENCOUNTER — Other Ambulatory Visit: Payer: Self-pay | Admitting: Family Medicine

## 2020-06-05 ENCOUNTER — Other Ambulatory Visit: Payer: Self-pay | Admitting: Nurse Practitioner

## 2020-06-05 DIAGNOSIS — I10 Essential (primary) hypertension: Secondary | ICD-10-CM

## 2020-06-05 MED FILL — traZODone HCL 50 MG TABS: 50 | 30 days supply | Qty: 30 | Fill #0

## 2020-06-05 MED FILL — AMLODIPINE BESYLATE 10 MG T: 10 | 30 days supply | Qty: 30 | Fill #0

## 2020-06-05 MED FILL — LOSARTAN POTASSIUM 100 MG T: 100 | 30 days supply | Qty: 30 | Fill #0

## 2020-06-12 MED FILL — LOSARTAN POTASSIUM 100 MG T: 100 | 30 days supply | Qty: 30 | Fill #0

## 2020-06-14 ENCOUNTER — Encounter: Payer: Medicaid Other | Admitting: Obstetrics and Gynecology

## 2020-06-14 ENCOUNTER — Encounter: Payer: Self-pay | Admitting: Obstetrics and Gynecology

## 2020-06-14 ENCOUNTER — Ambulatory Visit: Payer: Medicaid Other | Admitting: Pharmacist

## 2020-06-14 NOTE — Progress Notes (Signed)
Patient did not keep her GYN referral appointment for 06/14/2020.  Cornelia Copa MD Attending Center for [employer] [facility])

## 2020-06-15 ENCOUNTER — Other Ambulatory Visit: Payer: Self-pay | Admitting: Family Medicine

## 2020-06-16 ENCOUNTER — Other Ambulatory Visit: Payer: Self-pay

## 2020-06-19 ENCOUNTER — Other Ambulatory Visit: Payer: Self-pay | Admitting: Family Medicine

## 2020-06-19 ENCOUNTER — Other Ambulatory Visit: Payer: Self-pay

## 2020-06-20 ENCOUNTER — Other Ambulatory Visit: Payer: Self-pay

## 2020-06-21 ENCOUNTER — Other Ambulatory Visit: Payer: Self-pay

## 2020-06-21 MED ORDER — MEGESTROL ACETATE 40 MG PO TABS
ORAL_TABLET | ORAL | 3 refills | Status: DC
  Filled 2020-06-21: qty 15, 30d supply, fill #0
  Filled 2020-08-02: qty 15, 30d supply, fill #1
  Filled 2020-09-04: qty 15, 30d supply, fill #2
  Filled 2020-10-08: qty 15, 30d supply, fill #3
  Filled 2020-11-08: qty 15, 30d supply, fill #4
  Filled 2020-12-03: qty 15, 30d supply, fill #5
  Filled 2021-01-14: qty 15, 30d supply, fill #6
  Filled 2021-02-21: qty 15, 30d supply, fill #7
  Filled 2021-04-01: qty 15, 30d supply, fill #0
  Filled 2021-04-01: qty 15, 30d supply, fill #8
  Filled 2021-05-01: qty 15, 30d supply, fill #1
  Filled 2021-05-30: qty 15, 30d supply, fill #2

## 2020-06-22 ENCOUNTER — Other Ambulatory Visit: Payer: Self-pay

## 2020-06-26 ENCOUNTER — Ambulatory Visit: Payer: Medicaid Other | Admitting: Nurse Practitioner

## 2020-06-28 MED FILL — Amlodipine Besylate Tab 10 MG (Base Equivalent): ORAL | 30 days supply | Qty: 30 | Fill #0 | Status: AC

## 2020-07-02 ENCOUNTER — Other Ambulatory Visit: Payer: Self-pay

## 2020-07-05 ENCOUNTER — Other Ambulatory Visit: Payer: Self-pay

## 2020-07-06 ENCOUNTER — Other Ambulatory Visit: Payer: Self-pay

## 2020-07-16 ENCOUNTER — Other Ambulatory Visit: Payer: Self-pay

## 2020-07-16 MED FILL — Atorvastatin Calcium Tab 20 MG (Base Equivalent): ORAL | 30 days supply | Qty: 30 | Fill #0 | Status: AC

## 2020-07-16 MED FILL — Losartan Potassium Tab 100 MG: ORAL | 30 days supply | Qty: 30 | Fill #0 | Status: AC

## 2020-07-16 MED FILL — Carvedilol Tab 12.5 MG: ORAL | 30 days supply | Qty: 60 | Fill #0 | Status: AC

## 2020-07-17 ENCOUNTER — Other Ambulatory Visit: Payer: Self-pay

## 2020-08-02 ENCOUNTER — Other Ambulatory Visit: Payer: Self-pay

## 2020-08-02 MED FILL — Amlodipine Besylate Tab 10 MG (Base Equivalent): ORAL | 30 days supply | Qty: 30 | Fill #1 | Status: AC

## 2020-08-15 ENCOUNTER — Other Ambulatory Visit: Payer: Self-pay | Admitting: Nurse Practitioner

## 2020-08-15 DIAGNOSIS — I10 Essential (primary) hypertension: Secondary | ICD-10-CM

## 2020-08-15 MED ORDER — LOSARTAN POTASSIUM 100 MG PO TABS
ORAL_TABLET | Freq: Every day | ORAL | 1 refills | Status: DC
  Filled 2020-08-15: qty 30, 30d supply, fill #0
  Filled 2020-09-20: qty 30, 30d supply, fill #1

## 2020-08-15 MED FILL — Atorvastatin Calcium Tab 20 MG (Base Equivalent): ORAL | 30 days supply | Qty: 30 | Fill #1 | Status: AC

## 2020-08-15 NOTE — Telephone Encounter (Signed)
Requested medication (s) are due for refill today: yes  Requested medication (s) are on the active medication list: yes  Last refill:  06/05/20-06/05/21 #30 1 refill  Future visit scheduled: no  Notes to clinic:  see epic automation     Requested Prescriptions  Pending Prescriptions Disp Refills   losartan (COZAAR) 100 MG tablet 30 tablet 1    Sig: TAKE 1 TABLET (100 MG TOTAL) BY MOUTH DAILY.      Cardiovascular:  Angiotensin Receptor Blockers Failed - 08/15/2020  6:16 PM      Failed - Last BP in normal range    BP Readings from Last 1 Encounters:  05/31/20 (!) 174/114          Passed - Cr in normal range and within 180 days    Creatinine, Ser  Date Value Ref Range Status  03/27/2020 0.59 0.57 - 1.00 mg/dL Final   Creatinine, Urine  Date Value Ref Range Status  03/12/2009 52.9 mg/dL Final          Passed - K in normal range and within 180 days    Potassium  Date Value Ref Range Status  03/27/2020 3.9 3.5 - 5.2 mmol/L Final          Passed - Patient is not pregnant      Passed - Valid encounter within last 6 months    Recent Outpatient Visits           2 months ago Essential hypertension   [facility] Lois Huxley, Cornelius Moras, RPH-CPP   3 months ago Essential hypertension   [facility] Lois Huxley, Cornelius Moras, RPH-CPP   4 months ago Essential hypertension   [facility] Lois Huxley, Cornelius Moras, RPH-CPP   4 months ago Essential hypertension   [facility] [place], Shea Stakes, NP   1 year ago Microcytic hypochromic anemia   [facility] [place], Cornelius Moras, RPH-CPP

## 2020-08-16 ENCOUNTER — Other Ambulatory Visit: Payer: Self-pay

## 2020-08-20 ENCOUNTER — Other Ambulatory Visit: Payer: Self-pay

## 2020-08-20 MED FILL — Carvedilol Tab 12.5 MG: ORAL | 30 days supply | Qty: 60 | Fill #1 | Status: AC

## 2020-09-04 ENCOUNTER — Other Ambulatory Visit: Payer: Self-pay

## 2020-09-04 ENCOUNTER — Other Ambulatory Visit: Payer: Self-pay | Admitting: Family Medicine

## 2020-09-04 MED ORDER — AMLODIPINE BESYLATE 10 MG PO TABS
ORAL_TABLET | Freq: Every day | ORAL | 0 refills | Status: DC
  Filled 2020-09-04: qty 30, 30d supply, fill #0

## 2020-09-04 NOTE — Telephone Encounter (Signed)
Requested medications are due for refill today yes  Requested medications are on the active medication list yes  Last refill 08/02/20  Last visit 03/2020  Future visit scheduled no  Notes to clinic Has already had a curtesy refill and there is no upcoming appointment scheduled.

## 2020-09-05 ENCOUNTER — Other Ambulatory Visit: Payer: Self-pay

## 2020-09-20 ENCOUNTER — Other Ambulatory Visit: Payer: Self-pay

## 2020-09-20 MED FILL — Atorvastatin Calcium Tab 20 MG (Base Equivalent): ORAL | 30 days supply | Qty: 30 | Fill #2 | Status: AC

## 2020-09-21 ENCOUNTER — Other Ambulatory Visit: Payer: Self-pay | Admitting: Family Medicine

## 2020-09-21 ENCOUNTER — Other Ambulatory Visit: Payer: Self-pay

## 2020-09-21 NOTE — Telephone Encounter (Signed)
Requesteesd medications are due for refill today yes  Requested medications are on the active medication list yes  Last refill 6/6  Last visit 05/2020  Future visit scheduled no  Notes to clinic Was to return in two weeks, no upcoming visit scheduled, please assess.

## 2020-09-24 ENCOUNTER — Other Ambulatory Visit: Payer: Self-pay

## 2020-09-24 ENCOUNTER — Ambulatory Visit: Payer: Self-pay | Attending: Nurse Practitioner | Admitting: Nurse Practitioner

## 2020-09-24 ENCOUNTER — Encounter: Payer: Self-pay | Admitting: Nurse Practitioner

## 2020-09-24 ENCOUNTER — Telehealth: Payer: Self-pay | Admitting: Nurse Practitioner

## 2020-09-24 DIAGNOSIS — E790 Hyperuricemia without signs of inflammatory arthritis and tophaceous disease: Secondary | ICD-10-CM

## 2020-09-24 DIAGNOSIS — D72829 Elevated white blood cell count, unspecified: Secondary | ICD-10-CM

## 2020-09-24 DIAGNOSIS — E785 Hyperlipidemia, unspecified: Secondary | ICD-10-CM

## 2020-09-24 DIAGNOSIS — F5101 Primary insomnia: Secondary | ICD-10-CM

## 2020-09-24 DIAGNOSIS — I10 Essential (primary) hypertension: Secondary | ICD-10-CM

## 2020-09-24 DIAGNOSIS — R7309 Other abnormal glucose: Secondary | ICD-10-CM

## 2020-09-24 MED ORDER — TRAZODONE HCL 50 MG PO TABS
ORAL_TABLET | Freq: Every evening | ORAL | 1 refills | Status: DC | PRN
  Filled 2020-09-24: qty 30, 30d supply, fill #0

## 2020-09-24 MED ORDER — AMLODIPINE BESYLATE 10 MG PO TABS
ORAL_TABLET | Freq: Every day | ORAL | 0 refills | Status: DC
Start: 2020-09-24 — End: 2020-11-16
  Filled 2020-09-24: qty 30, fill #0
  Filled 2020-10-08: qty 30, 30d supply, fill #0

## 2020-09-24 MED ORDER — LOSARTAN POTASSIUM 100 MG PO TABS
ORAL_TABLET | Freq: Every day | ORAL | 0 refills | Status: DC
  Filled 2020-09-24: qty 30, fill #0
  Filled 2020-10-23: qty 30, 30d supply, fill #0

## 2020-09-24 MED ORDER — CARVEDILOL 12.5 MG PO TABS
ORAL_TABLET | Freq: Two times a day (BID) | ORAL | 0 refills | Status: DC
  Filled 2020-09-24 – 2020-10-02 (×2): qty 60, 30d supply, fill #0

## 2020-09-24 MED ORDER — ATORVASTATIN CALCIUM 20 MG PO TABS
ORAL_TABLET | Freq: Every day | ORAL | 3 refills | Status: DC
Start: 2020-09-24 — End: 2021-10-08
  Filled 2020-09-24: qty 90, fill #0
  Filled 2020-10-23: qty 30, 30d supply, fill #0
  Filled 2020-11-28: qty 30, 30d supply, fill #1
  Filled 2020-12-31: qty 30, 30d supply, fill #2
  Filled 2021-02-01: qty 30, 30d supply, fill #3
  Filled 2021-03-05: qty 30, 30d supply, fill #4
  Filled 2021-05-01: qty 30, 30d supply, fill #5
  Filled 2021-05-02: qty 30, 30d supply, fill #0
  Filled 2021-05-30: qty 30, 30d supply, fill #1
  Filled 2021-07-01: qty 30, 30d supply, fill #2
  Filled 2021-08-06: qty 30, 30d supply, fill #3
  Filled 2021-09-05: qty 30, 30d supply, fill #4

## 2020-09-24 NOTE — Progress Notes (Signed)
Virtual Visit via Telephone Note Due to national recommendations of social distancing due to Elbert 19, telehealth visit is felt to be most appropriate for this patient at this time.  I discussed the limitations, risks, security and privacy concerns of performing an evaluation and management service by telephone and the availability of in person appointments. I also discussed with the patient that there may be a patient responsible charge related to this service. The patient expressed understanding and agreed to proceed.    I connected with Meagan Nelson on 09/24/20  at   2:50 PM EDT  EDT by telephone and verified that I am speaking with the correct person using two identifiers.  Location of Patient: Private Residence   Location of Provider: [place] participating in Telemedicine visit: Geryl Rankins FNP-BC Meagan Nelson    History of Present Illness: Telemedicine visit for: Follow up to HTN  She reports home readings as "good". Last recorded reading 145/82. States she is taking her medications as prescribed however when she tells me what medications she is taking she states she is taking carvedilol at night and this is a twice a day medication. She is currently prescribed amlodipine 10 mg daily, carvedilol 12.5 mg twice daily, losartan 100 mg daily. Denies chest pain, shortness of breath, palpitations, lightheadedness, dizziness, headaches or BLE edema.  I will have her follow-up with the pharmacist as most of her medications were filled in March for only a 30-day supply so it is highly unlikely she has been adherent with taking her medications. BP Readings from Last 3 Encounters:  05/31/20 (!) 174/114  05/10/20 (!) 173/136  04/10/20 (!) 162/115     Past Medical History:  Diagnosis Date   Abscess    facial   Anemia    Headache    History of blood transfusion    Hypertension    PMH   Insomnia     Past Surgical History:  Procedure  Laterality Date   CESAREAN SECTION     C/S x 1   DILATATION & CURETTAGE/HYSTEROSCOPY WITH MYOSURE N/A 03/11/2017   Procedure: DILATATION & CURETTAGE/HYSTEROSCOPY WITH MYOSURE;  Surgeon: Emily Filbert, MD;  Location: [place];  Service: Gynecology;  Laterality: N/A;   DILATION AND CURETTAGE OF UTERUS     TONSILLECTOMY      Family History  Problem Relation Age of Onset   Hypertension Mother    Anemia Sister    Anemia Brother    Anemia Sister    Anemia Other    Hypertension Other     Social History   Socioeconomic History   Marital status: Single    Spouse name: Not on file   Number of children: Not on file   Years of education: Not on file   Highest education level: Not on file  Occupational History   Not on file  Tobacco Use   Smoking status: Some Days    Packs/day: 0.10    Pack years: 0.00    Types: Cigarettes   Smokeless tobacco: Never  Vaping Use   Vaping Use: Never used  Substance and Sexual Activity   Alcohol use: Yes    Comment: ocassinally    Drug use: No   Sexual activity: Yes    Birth control/protection: None  Other Topics Concern   Not on file  Social History Narrative   Not on file   Social Determinants of Health   Financial Resource Strain: Not on file  Food Insecurity: Not on file  Transportation Needs: Not on file  Physical Activity: Not on file  Stress: Not on file  Social Connections: Not on file     Observations/Objective: Awake, alert and oriented x 3   ROS  Assessment and Plan: Diagnoses and all orders for this visit:  Leukocytosis, unspecified type -     CBC with Differential; Future  Essential hypertension -     losartan (COZAAR) 100 MG tablet; TAKE 1 TABLET (100 MG TOTAL) BY MOUTH DAILY. -     carvedilol (COREG) 12.5 MG tablet; TAKE 1 TABLET (12.5 MG TOTAL) BY MOUTH 2 (TWO) TIMES DAILY WITH A MEAL. -     amLODipine (NORVASC) 10 MG tablet; TAKE 1 TABLET (10 MG TOTAL) BY MOUTH DAILY. -     CMP14+EGFR;  Future Continue all antihypertensives as prescribed.  Remember to bring in your blood pressure log with you for your follow up appointment.  DASH/Mediterranean Diets are healthier choices for HTN.    Primary insomnia -     traZODone (DESYREL) 50 MG tablet; TAKE 1 TABLET (50 MG TOTAL) BY MOUTH AT BEDTIME AS NEEDED FOR SLEEP.  Elevated uric acid in blood -     Uric Acid; Future  Dyslipidemia, goal LDL below 100 -     atorvastatin (LIPITOR) 20 MG tablet; TAKE 1 TABLET (20 MG TOTAL) BY MOUTH DAILY. -     Lipid panel; Future INSTRUCTIONS: Work on a low fat, heart healthy diet and participate in regular aerobic exercise program by working out at least 150 minutes per week; 5 days a week-30 minutes per day. Avoid red meat/beef/steak,  fried foods. junk foods, sodas, sugary drinks, unhealthy snacking, alcohol and smoking.  Drink at least 80 oz of water per day and monitor your carbohydrate intake daily.    Elevated glucose -     Hemoglobin A1c; Future    Follow Up Instructions Return for BPcheck with luke next week. See me in october.     I discussed the assessment and treatment plan with the patient. The patient was provided an opportunity to ask questions and all were answered. The patient agreed with the plan and demonstrated an understanding of the instructions.   The patient was advised to call back or seek an in-person evaluation if the symptoms worsen or if the condition fails to improve as anticipated.  I provided 12 minutes of non-face-to-face time during this encounter including median intraservice time, reviewing previous notes, labs, imaging, medications and explaining diagnosis and management.  Gildardo Pounds, FNP-BC

## 2020-09-24 NOTE — Telephone Encounter (Signed)
No answer. LVM to return call to office 

## 2020-10-01 ENCOUNTER — Other Ambulatory Visit: Payer: Self-pay

## 2020-10-02 ENCOUNTER — Other Ambulatory Visit: Payer: Self-pay

## 2020-10-04 ENCOUNTER — Other Ambulatory Visit: Payer: Self-pay

## 2020-10-04 ENCOUNTER — Ambulatory Visit: Payer: Self-pay | Attending: Nurse Practitioner | Admitting: Pharmacist

## 2020-10-04 ENCOUNTER — Encounter: Payer: Self-pay | Admitting: Pharmacist

## 2020-10-04 DIAGNOSIS — I10 Essential (primary) hypertension: Secondary | ICD-10-CM

## 2020-10-04 DIAGNOSIS — D72829 Elevated white blood cell count, unspecified: Secondary | ICD-10-CM

## 2020-10-04 DIAGNOSIS — E790 Hyperuricemia without signs of inflammatory arthritis and tophaceous disease: Secondary | ICD-10-CM

## 2020-10-04 DIAGNOSIS — E785 Hyperlipidemia, unspecified: Secondary | ICD-10-CM

## 2020-10-04 DIAGNOSIS — R7309 Other abnormal glucose: Secondary | ICD-10-CM

## 2020-10-04 NOTE — Progress Notes (Signed)
   S:    PCP: [name]  Patient arrives well and in good spirits.  Presents to the clinic for hypertension evaluation, counseling, and management. Patient was referred and last seen by PCP on 09/24/2020.  She denies HA, blurred vision, dyspnea, or chest pain.   Medication adherence: reported. She denies any missed doses.    Current BP Medications include:  Amlodipine 10 mg daily, carvedilol 12.5 mg BID, Losartan 100 mg daily  Dietary habits include: drank 1 soda today at work, reports compliance with salt restriction Exercise habits include: none reported   O:  Vitals:   10/04/20 1206  BP: (!) 157/107    Home BP readings:  - Reports that these have been good.  - Endorses readings in the 150s/70s. Uses a wrist cuff but does not have it with her today.  Last 3 Office BP readings: BP Readings from Last 3 Encounters:  10/04/20 (!) 157/107  05/31/20 (!) 174/114  05/10/20 (!) 173/136   BMET    Component Value Date/Time   NA 141 03/27/2020 1155   K 3.9 03/27/2020 1155   CL 98 03/27/2020 1155   CO2 29 03/27/2020 1155   GLUCOSE 94 03/27/2020 1155   GLUCOSE 98 03/11/2017 1342   BUN 9 03/27/2020 1155   CREATININE 0.59 03/27/2020 1155   CALCIUM 9.9 03/27/2020 1155   GFRNONAA 112 03/27/2020 1155   GFRAA 129 03/27/2020 1155   Renal function: CrCl cannot be calculated (Patient's most recent lab result is older than the maximum 21 days allowed.).  Clinical ASCVD: No  The 10-year ASCVD risk score Denman George DC Jr., et al., 2013) is: 23.1%   Values used to calculate the score:     Age: 45 years     Sex: Female     Is Non-Hispanic African American: Yes     Diabetic: No     Tobacco smoker: Yes     Systolic Blood Pressure: 157 mmHg     Is BP treated: Yes     HDL Cholesterol: 39 mg/dL     Total Cholesterol: 222 mg/dL  A/P: Hypertension longstanding currently uncontrolled on current medications. BP Goal = < 130/80 mmHg. Medication adherence appropriate. Reinforced that her SBP should be  <130. She declines changes to her medication regimen today.  -Continue current regimen.  -Counseled on lifestyle modifications for blood pressure control including reduced dietary sodium, increased exercise, adequate sleep. -Labs per PCP  Results reviewed and written information provided. Total time in face-to-face counseling 15 minutes.   F/U Clinic Visit w/ PCP.   Butch Penny, PharmD, [place], CPP Clinical Pharmacist [facility] [phone]

## 2020-10-05 LAB — CMP14+EGFR
ALT: 13 IU/L (ref 0–32)
AST: 16 IU/L (ref 0–40)
Albumin/Globulin Ratio: 1.6 (ref 1.2–2.2)
Albumin: 4.2 g/dL (ref 3.8–4.8)
Alkaline Phosphatase: 76 IU/L (ref 44–121)
BUN/Creatinine Ratio: 15 (ref 9–23)
BUN: 10 mg/dL (ref 6–24)
Bilirubin Total: 0.6 mg/dL (ref 0.0–1.2)
CO2: 24 mmol/L (ref 20–29)
Calcium: 9.3 mg/dL (ref 8.7–10.2)
Chloride: 103 mmol/L (ref 96–106)
Creatinine, Ser: 0.65 mg/dL (ref 0.57–1.00)
Globulin, Total: 2.7 g/dL (ref 1.5–4.5)
Glucose: 86 mg/dL (ref 65–99)
Potassium: 3.8 mmol/L (ref 3.5–5.2)
Sodium: 143 mmol/L (ref 134–144)
Total Protein: 6.9 g/dL (ref 6.0–8.5)
eGFR: 111 mL/min/{1.73_m2} (ref >59)

## 2020-10-05 LAB — CBC WITH DIFFERENTIAL/PLATELET
Basophils Absolute: 0.1 10*3/uL (ref 0.0–0.2)
Basos: 1 %
EOS (ABSOLUTE): 0.4 10*3/uL (ref 0.0–0.4)
Eos: 3 %
Hematocrit: 36.7 % (ref 34.0–46.6)
Hemoglobin: 12.7 g/dL (ref 11.1–15.9)
Immature Grans (Abs): 0 10*3/uL (ref 0.0–0.1)
Immature Granulocytes: 0 %
Lymphocytes Absolute: 2.8 10*3/uL (ref 0.7–3.1)
Lymphs: 22 %
MCH: 31.1 pg (ref 26.6–33.0)
MCHC: 34.6 g/dL (ref 31.5–35.7)
MCV: 90 fL (ref 79–97)
Monocytes Absolute: 0.8 10*3/uL (ref 0.1–0.9)
Monocytes: 7 %
Neutrophils Absolute: 8.7 10*3/uL — ABNORMAL HIGH (ref 1.4–7.0)
Neutrophils: 67 %
Platelets: 255 10*3/uL (ref 150–450)
RBC: 4.08 x10E6/uL (ref 3.77–5.28)
RDW: 12.9 % (ref 11.7–15.4)
WBC: 12.8 10*3/uL — ABNORMAL HIGH (ref 3.4–10.8)

## 2020-10-05 LAB — LIPID PANEL
Chol/HDL Ratio: 3.3 ratio (ref 0.0–4.4)
Cholesterol, Total: 131 mg/dL (ref 100–199)
HDL: 40 mg/dL (ref >39)
LDL Chol Calc (NIH): 69 mg/dL (ref 0–99)
Triglycerides: 123 mg/dL (ref 0–149)
VLDL Cholesterol Cal: 22 mg/dL (ref 5–40)

## 2020-10-05 LAB — URIC ACID: Uric Acid: 5.5 mg/dL (ref 2.6–6.2)

## 2020-10-05 LAB — HEMOGLOBIN A1C
Est. average glucose Bld gHb Est-mCnc: 108 mg/dL
Hgb A1c MFr Bld: 5.4 % (ref 4.8–5.6)

## 2020-10-07 ENCOUNTER — Other Ambulatory Visit: Payer: Self-pay | Admitting: Nurse Practitioner

## 2020-10-07 DIAGNOSIS — D72829 Elevated white blood cell count, unspecified: Secondary | ICD-10-CM

## 2020-10-07 DIAGNOSIS — D729 Disorder of white blood cells, unspecified: Secondary | ICD-10-CM

## 2020-10-08 ENCOUNTER — Telehealth: Payer: Self-pay | Admitting: Hematology and Oncology

## 2020-10-08 ENCOUNTER — Other Ambulatory Visit: Payer: Self-pay

## 2020-10-08 NOTE — Telephone Encounter (Signed)
Scheduled appt per referral. Pt aware.  

## 2020-10-18 ENCOUNTER — Inpatient Hospital Stay: Payer: Medicaid Other

## 2020-10-18 ENCOUNTER — Inpatient Hospital Stay: Payer: Self-pay | Attending: Hematology and Oncology | Admitting: Hematology and Oncology

## 2020-10-18 ENCOUNTER — Encounter: Payer: Self-pay | Admitting: Hematology and Oncology

## 2020-10-18 ENCOUNTER — Other Ambulatory Visit: Payer: Self-pay

## 2020-10-18 VITALS — BP 162/117 | HR 85 | Temp 98.1°F | Resp 20 | Ht 66.0 in | Wt 256.1 lb

## 2020-10-18 DIAGNOSIS — D72829 Elevated white blood cell count, unspecified: Secondary | ICD-10-CM

## 2020-10-18 DIAGNOSIS — I10 Essential (primary) hypertension: Secondary | ICD-10-CM

## 2020-10-18 DIAGNOSIS — Z79899 Other long term (current) drug therapy: Secondary | ICD-10-CM | POA: Insufficient documentation

## 2020-10-18 DIAGNOSIS — F17210 Nicotine dependence, cigarettes, uncomplicated: Secondary | ICD-10-CM

## 2020-10-18 DIAGNOSIS — D72825 Bandemia: Secondary | ICD-10-CM

## 2020-10-18 LAB — CBC WITH DIFFERENTIAL/PLATELET
Abs Immature Granulocytes: 0.03 10*3/uL (ref 0.00–0.07)
Basophils Absolute: 0.1 10*3/uL (ref 0.0–0.1)
Basophils Relative: 1 %
Eosinophils Absolute: 0.4 10*3/uL (ref 0.0–0.5)
Eosinophils Relative: 3 %
HCT: 36.1 % (ref 36.0–46.0)
Hemoglobin: 12.3 g/dL (ref 12.0–15.0)
Immature Granulocytes: 0 %
Lymphocytes Relative: 19 %
Lymphs Abs: 2.4 10*3/uL (ref 0.7–4.0)
MCH: 31 pg (ref 26.0–34.0)
MCHC: 34.1 g/dL (ref 30.0–36.0)
MCV: 90.9 fL (ref 80.0–100.0)
Monocytes Absolute: 0.8 10*3/uL (ref 0.1–1.0)
Monocytes Relative: 7 %
Neutro Abs: 9.3 10*3/uL — ABNORMAL HIGH (ref 1.7–7.7)
Neutrophils Relative %: 70 %
Platelets: 263 10*3/uL (ref 150–400)
RBC: 3.97 MIL/uL (ref 3.87–5.11)
RDW: 13.1 % (ref 11.5–15.5)
WBC: 13 10*3/uL — ABNORMAL HIGH (ref 4.0–10.5)
nRBC: 0 % (ref 0.0–0.2)

## 2020-10-18 LAB — SEDIMENTATION RATE: Sed Rate: 24 mm/hr — ABNORMAL HIGH (ref 0–22)

## 2020-10-18 LAB — TSH: TSH: 1.077 u[IU]/mL (ref 0.308–3.960)

## 2020-10-18 NOTE — Progress Notes (Signed)
[facility] NOTE  Patient Care Team: Meagan Pounds, NP as PCP - General (Nurse Practitioner)  CHIEF COMPLAINTS/PURPOSE OF CONSULTATION:  Leukocytosis.  ASSESSMENT & PLAN:  No problem-specific Assessment & Plan notes found for this encounter.  Orders Placed This Encounter  Procedures   CBC with Differential/Platelet    Standing Status:   Standing    Number of Occurrences:   22    Standing Expiration Date:   10/18/2021   Pathologist smear review    Standing Status:   Future    Standing Expiration Date:   10/18/2021   Sedimentation rate    Standing Status:   Future    Standing Expiration Date:   10/18/2021   ANA, IFA (with reflex)    Standing Status:   Future    Standing Expiration Date:   10/18/2021   TSH    Standing Status:   Standing    Number of Occurrences:   22    Standing Expiration Date:   10/18/2021   This is a very pleasant 45 yr old female patient with HTN referred to hematology with leukocytosis.  She denies any complaints except for a dental infection recently No B symptoms, except for hot flashes related to menopause.  No history of autoimmune diseases,ongoing infections, steroid use. PE unremarkable except for obesity.  I have reviewed her labs for the past 3 yrs, has had chronic leukocytosis, predominantly neutrophilia I ordered labs today, she will RTC in 4 weeks to review lab results If she has no clear evidence of explanation for leukocytosis/neutrophilia, we will proceed with myeloproliferative work-up when she comes back for a follow-up. We have discussed about common causes of leukocytosis including infections, medications, smoking, obesity, autoimmune diseases, pain and rarely myeloproliferative disorders. She will proceed with labs today and FU as recommended.  Thank you for consulting [place] in the care of this patient.  Please not hesitate to contact us with any additional questions or concerns.   HISTORY OF PRESENTING ILLNESS:  Meagan Nelson 45 y.o. female is here because of Leukocytosis.  This is a very pleasant 45 year old female patient with past medical history significant for hypertension referred to hematology for evaluation of leukocytosis.  Patient tells me that she most recently had a dental abscess which spontaneously resolved.  She cannot remember if she ever had issues with white blood cell count in the past.  If not for the dental issues, she feels well, no new B symptoms except for hot flashes which she associates with perimenopausal time. She denies any other autoimmune diseases, steroid use, smokes about 1 cigarette a day. She denies any changes in bowel habits or urinary habits.  No new neurological complaints.  She has history of severe anemia hence takes Megace for menstrual cycle control.  She is also working on losing weight, lost about 30 Nelson in the past 1 year.  She is up-to-date with her mammogram.  No family history of hematological disorders. Rest of the pertinent 10 point ROS reviewed and negative.  REVIEW OF SYSTEMS:   Constitutional: Denies fevers, chills or abnormal night sweats Eyes: Denies blurriness of vision, double vision or watery eyes Ears, nose, mouth, throat, and face: Denies mucositis or sore throat Respiratory: Denies cough, dyspnea or wheezes Cardiovascular: Denies palpitation, chest discomfort or lower extremity swelling Gastrointestinal:  Denies nausea, heartburn or change in bowel habits Skin: Denies abnormal skin rashes Lymphatics: Denies new lymphadenopathy or easy bruising Neurological:Denies numbness, tingling or new weaknesses Behavioral/Psych: Mood is  stable, no new changes  All other systems were reviewed with the patient and are negative.  MEDICAL HISTORY:  Past Medical History:  Diagnosis Date   Abscess    facial   Anemia    Headache    History of blood transfusion    Hypertension    PMH   Insomnia     SURGICAL HISTORY: Past Surgical History:  Procedure  Laterality Date   CESAREAN SECTION     C/S x 1   DILATATION & CURETTAGE/HYSTEROSCOPY WITH MYOSURE N/A 03/11/2017   Procedure: DILATATION & CURETTAGE/HYSTEROSCOPY WITH MYOSURE;  Surgeon: Emily Filbert, MD;  Location: [facility];  Service: Gynecology;  Laterality: N/A;   DILATION AND CURETTAGE OF UTERUS     TONSILLECTOMY      SOCIAL HISTORY: Social History   Socioeconomic History   Marital status: Single    Spouse name: Not on file   Number of children: Not on file   Years of education: Not on file   Highest education level: Not on file  Occupational History   Not on file  Tobacco Use   Smoking status: Some Days    Packs/day: 0.10    Types: Cigarettes   Smokeless tobacco: Never  Vaping Use   Vaping Use: Never used  Substance and Sexual Activity   Alcohol use: Yes    Comment: ocassinally    Drug use: No   Sexual activity: Yes    Birth control/protection: None  Other Topics Concern   Not on file  Social History Narrative   Not on file   Social Determinants of Health   Financial Resource Strain: Not on file  Food Insecurity: Not on file  Transportation Needs: Not on file  Physical Activity: Not on file  Stress: Not on file  Social Connections: Not on file  Intimate Partner Violence: Not on file    FAMILY HISTORY: Family History  Problem Relation Age of Onset   Hypertension Mother    Anemia Sister    Anemia Brother    Anemia Sister    Anemia Other    Hypertension Other     ALLERGIES:  is allergic to naprosyn [naproxen] and amlodipine.  MEDICATIONS:  Current Outpatient Medications  Medication Sig Dispense Refill   amLODipine (NORVASC) 10 MG tablet TAKE 1 TABLET (10 MG TOTAL) BY MOUTH DAILY. 30 tablet 0   atorvastatin (LIPITOR) 20 MG tablet TAKE 1 TABLET (20 MG TOTAL) BY MOUTH DAILY. 90 tablet 3   benzonatate (TESSALON) 100 MG capsule Take 1 capsule (100 mg total) by mouth every 8 (eight) hours. 21 capsule 0   carvedilol (COREG) 12.5 MG tablet  TAKE 1 TABLET (12.5 MG TOTAL) BY MOUTH 2 (TWO) TIMES DAILY WITH A MEAL. 60 tablet 0   cetirizine (ZYRTEC ALLERGY) 10 MG tablet Take 1 tablet (10 mg total) by mouth daily. 30 tablet 0   cyclobenzaprine (FLEXERIL) 10 MG tablet TAKE 1 TABLET (10 MG TOTAL) BY MOUTH 3 (THREE) TIMES DAILY AS NEEDED FOR MUSCLE SPASMS. 60 tablet 1   losartan (COZAAR) 100 MG tablet TAKE 1 TABLET (100 MG TOTAL) BY MOUTH DAILY. 30 tablet 0   megestrol (MEGACE) 40 MG tablet TAKE 0.5 TABLETS (20 MG TOTAL) BY MOUTH DAILY. 45 tablet 3   meloxicam (MOBIC) 15 MG tablet TAKE 1 TABLET (15 MG TOTAL) BY MOUTH DAILY WITH BREAKFAST. MAKE SURE YOU TAKE WITH FOOD 30 tablet 1   traZODone (DESYREL) 50 MG tablet TAKE 1 TABLET (50 MG TOTAL) BY MOUTH  AT BEDTIME AS NEEDED FOR SLEEP. 30 tablet 1   No current facility-administered medications for this visit.    PHYSICAL EXAMINATION: ECOG PERFORMANCE STATUS: 0 - Asymptomatic  Vitals:   10/18/20 1109  BP: (!) 162/117  Pulse: 85  Resp: 20  Temp: 98.1 F (36.7 C)  SpO2: 100%   Filed Weights   10/18/20 1109  Weight: 256 lb 1.6 oz (116.2 kg)    GENERAL:alert, no distress and comfortable, obese. SKIN: skin color, texture, turgor are normal, no rashes or significant lesions EYES: normal, conjunctiva are pink and non-injected, sclera clear OROPHARYNX:no exudate, no erythema and lips, buccal mucosa, and tongue normal  NECK: supple, thyroid normal size, non-tender, without nodularity LYMPH:  no palpable lymphadenopathy in the cervical, axillary LUNGS: clear to auscultation and percussion with normal breathing effort HEART: regular rate & rhythm and no murmurs and no lower extremity edema ABDOMEN:abdomen soft, non-tender and normal bowel sounds.  No hepatosplenomegaly. Musculoskeletal:no cyanosis of digits and no clubbing  PSYCH: alert & oriented x 3 with fluent speech NEURO: no focal motor/sensory deficits  LABORATORY DATA:  I have reviewed the data as listed Lab Results  Component  Value Date   WBC 12.8 (H) 10/04/2020   HGB 12.7 10/04/2020   HCT 36.7 10/04/2020   MCV 90 10/04/2020   PLT 255 10/04/2020     Chemistry      Component Value Date/Time   NA 143 10/04/2020 1134   K 3.8 10/04/2020 1134   CL 103 10/04/2020 1134   CO2 24 10/04/2020 1134   BUN 10 10/04/2020 1134   CREATININE 0.65 10/04/2020 1134      Component Value Date/Time   CALCIUM 9.3 10/04/2020 1134   ALKPHOS 76 10/04/2020 1134   AST 16 10/04/2020 1134   ALT 13 10/04/2020 1134   BILITOT 0.6 10/04/2020 1134     I have reviewed pertinent labs.  She has always had mild leukocytosis over the past 3 years.  Predominantly neutrophilia.  Labs from today shows a white blood cell count of 13,000 again with predominantly neutrophils.  No basophilia noted no thrombocytosis noted.  We have ordered some labs including ESR, TSH, ANA and smear review today.  If she has no clear evidence of reactive causes of leukocytosis, I will proceed with myeloproliferative work-up when she comes on September 1 for follow-up.  RADIOGRAPHIC STUDIES: I have personally reviewed the radiological images as listed and agreed with the findings in the report. No results found.  All questions were answered. The patient knows to call the clinic with any problems, questions or concerns. I spent 45 minutes in the care of this patient including H and P, review of records, counseling and coordination of care.     Benay Pike, MD 10/18/2020 11:30 AM

## 2020-10-18 NOTE — Progress Notes (Deleted)
[facility] CONSULT NOTE  Patient Care Team: Claiborne Rigg, NP as PCP - General (Nurse Practitioner)  CHIEF COMPLAINTS/PURPOSE OF CONSULTATION:  ***  ASSESSMENT & PLAN:  No problem-specific Assessment & Plan notes found for this encounter.  No orders of the defined types were placed in this encounter.    HISTORY OF PRESENTING ILLNESS:  Meagan Nelson 45 y.o. female is here because of ***  REVIEW OF SYSTEMS:   Constitutional: Denies fevers, chills or abnormal night sweats Eyes: Denies blurriness of vision, double vision or watery eyes Ears, nose, mouth, throat, and face: Denies mucositis or sore throat Respiratory: Denies cough, dyspnea or wheezes Cardiovascular: Denies palpitation, chest discomfort or lower extremity swelling Gastrointestinal:  Denies nausea, heartburn or change in bowel habits Skin: Denies abnormal skin rashes Lymphatics: Denies new lymphadenopathy or easy bruising Neurological:Denies numbness, tingling or new weaknesses Behavioral/Psych: Mood is stable, no new changes  All other systems were reviewed with the patient and are negative.  MEDICAL HISTORY:  Past Medical History:  Diagnosis Date   Abscess    facial   Anemia    Headache    History of blood transfusion    Hypertension    PMH   Insomnia     SURGICAL HISTORY: Past Surgical History:  Procedure Laterality Date   CESAREAN SECTION     C/S x 1   DILATATION & CURETTAGE/HYSTEROSCOPY WITH MYOSURE N/A 03/11/2017   Procedure: DILATATION & CURETTAGE/HYSTEROSCOPY WITH MYOSURE;  Surgeon: Allie Bossier, MD;  Location: [facility];  Service: Gynecology;  Laterality: N/A;   DILATION AND CURETTAGE OF UTERUS     TONSILLECTOMY      SOCIAL HISTORY: Social History   Socioeconomic History   Marital status: Single    Spouse name: Not on file   Number of children: Not on file   Years of education: Not on file   Highest education level: Not on file  Occupational History    Not on file  Tobacco Use   Smoking status: Some Days    Packs/day: 0.10    Types: Cigarettes   Smokeless tobacco: Never  Vaping Use   Vaping Use: Never used  Substance and Sexual Activity   Alcohol use: Yes    Comment: ocassinally    Drug use: No   Sexual activity: Yes    Birth control/protection: None  Other Topics Concern   Not on file  Social History Narrative   Not on file   Social Determinants of Health   Financial Resource Strain: Not on file  Food Insecurity: Not on file  Transportation Needs: Not on file  Physical Activity: Not on file  Stress: Not on file  Social Connections: Not on file  Intimate Partner Violence: Not on file    FAMILY HISTORY: Family History  Problem Relation Age of Onset   Hypertension Mother    Anemia Sister    Anemia Brother    Anemia Sister    Anemia Other    Hypertension Other     ALLERGIES:  is allergic to naprosyn [naproxen] and amlodipine.  MEDICATIONS:  Current Outpatient Medications  Medication Sig Dispense Refill   amLODipine (NORVASC) 10 MG tablet TAKE 1 TABLET (10 MG TOTAL) BY MOUTH DAILY. 30 tablet 0   atorvastatin (LIPITOR) 20 MG tablet TAKE 1 TABLET (20 MG TOTAL) BY MOUTH DAILY. 90 tablet 3   benzonatate (TESSALON) 100 MG capsule Take 1 capsule (100 mg total) by mouth every 8 (eight) hours. 21 capsule 0  carvedilol (COREG) 12.5 MG tablet TAKE 1 TABLET (12.5 MG TOTAL) BY MOUTH 2 (TWO) TIMES DAILY WITH A MEAL. 60 tablet 0   cetirizine (ZYRTEC ALLERGY) 10 MG tablet Take 1 tablet (10 mg total) by mouth daily. 30 tablet 0   cyclobenzaprine (FLEXERIL) 10 MG tablet TAKE 1 TABLET (10 MG TOTAL) BY MOUTH 3 (THREE) TIMES DAILY AS NEEDED FOR MUSCLE SPASMS. 60 tablet 1   losartan (COZAAR) 100 MG tablet TAKE 1 TABLET (100 MG TOTAL) BY MOUTH DAILY. 30 tablet 0   megestrol (MEGACE) 40 MG tablet TAKE 0.5 TABLETS (20 MG TOTAL) BY MOUTH DAILY. 45 tablet 3   meloxicam (MOBIC) 15 MG tablet TAKE 1 TABLET (15 MG TOTAL) BY MOUTH DAILY WITH  BREAKFAST. MAKE SURE YOU TAKE WITH FOOD 30 tablet 1   traZODone (DESYREL) 50 MG tablet TAKE 1 TABLET (50 MG TOTAL) BY MOUTH AT BEDTIME AS NEEDED FOR SLEEP. 30 tablet 1   No current facility-administered medications for this visit.    PHYSICAL EXAMINATION: ECOG PERFORMANCE STATUS: 0 - Asymptomatic  Vitals:   10/18/20 1109  BP: (!) 162/117  Pulse: 85  Resp: 20  Temp: 98.1 F (36.7 C)  SpO2: 100%   Filed Weights   10/18/20 1109  Weight: 256 lb 1.6 oz (116.2 kg)    GENERAL:alert, no distress and comfortable SKIN: skin color, texture, turgor are normal, no rashes or significant lesions EYES: normal, conjunctiva are pink and non-injected, sclera clear OROPHARYNX:no exudate, no erythema and lips, buccal mucosa, and tongue normal  NECK: supple, thyroid normal size, non-tender, without nodularity LYMPH:  no palpable lymphadenopathy in the cervical, axillary or inguinal LUNGS: clear to auscultation and percussion with normal breathing effort HEART: regular rate & rhythm and no murmurs and no lower extremity edema ABDOMEN:abdomen soft, non-tender and normal bowel sounds Musculoskeletal:no cyanosis of digits and no clubbing  PSYCH: alert & oriented x 3 with fluent speech NEURO: no focal motor/sensory deficits  LABORATORY DATA:  I have reviewed the data as listed Lab Results  Component Value Date   WBC 12.8 (H) 10/04/2020   HGB 12.7 10/04/2020   HCT 36.7 10/04/2020   MCV 90 10/04/2020   PLT 255 10/04/2020     Chemistry      Component Value Date/Time   NA 143 10/04/2020 1134   K 3.8 10/04/2020 1134   CL 103 10/04/2020 1134   CO2 24 10/04/2020 1134   BUN 10 10/04/2020 1134   CREATININE 0.65 10/04/2020 1134      Component Value Date/Time   CALCIUM 9.3 10/04/2020 1134   ALKPHOS 76 10/04/2020 1134   AST 16 10/04/2020 1134   ALT 13 10/04/2020 1134   BILITOT 0.6 10/04/2020 1134       RADIOGRAPHIC STUDIES: I have personally reviewed the radiological images as listed and  agreed with the findings in the report. No results found.  All questions were answered. The patient knows to call the clinic with any problems, questions or concerns. I spent *** minutes in the care of this patient including H and P, review of records, counseling and coordination of care.     Rachel Moulds, MD 10/18/2020 11:22 AM

## 2020-10-19 LAB — PATHOLOGIST SMEAR REVIEW

## 2020-10-23 ENCOUNTER — Other Ambulatory Visit: Payer: Self-pay

## 2020-10-23 ENCOUNTER — Ambulatory Visit: Payer: Self-pay | Admitting: *Deleted

## 2020-10-23 ENCOUNTER — Ambulatory Visit: Payer: Self-pay

## 2020-10-23 LAB — ANTINUCLEAR ANTIBODIES, IFA: ANA Ab, IFA: NEGATIVE

## 2020-10-23 MED FILL — Meloxicam Tab 15 MG: ORAL | 30 days supply | Qty: 30 | Fill #0 | Status: AC

## 2020-10-23 NOTE — Telephone Encounter (Signed)
Pt reports tested positive yesterday, at workplace, symptoms onset yesterday.  Multiple cases at workplace. Pt with severe cough, productive for dark greenish phlegm. LGT yesterday 99.2, presently 99.6. Pt with sore throat "On fire." Pt reports wheezing at times "With coughing." Pt continuously coughing during call. Advised UC. States will follow disposition. Assured pt NT would route to practice for PCPs review. Care advise given.

## 2020-10-23 NOTE — Telephone Encounter (Signed)
Pt has covid . she has low grade fever, cough, throat feels like it is on fire.  Green mucus. Would like the medicine for the covid.  This is her 2nd time getting covid. Her cough sounds really bad.  Reason for Disposition  Patient sounds very sick or weak to the triager  Answer Assessment - Initial Assessment Questions 1. COVID-19 DIAGNOSIS: "Who made your COVID-19 diagnosis?" "Was it confirmed by a positive lab test or self-test?" If not diagnosed by a doctor (or NP/PA), ask "Are there lots of cases (community spread) where you live?" Note: See public health department website, if unsure.     PCR at workplace 2. COVID-19 EXPOSURE: "Was there any known exposure to COVID before the symptoms began?" CDC Definition of close contact: within 6 feet (2 meters) for a total of 15 minutes or more over a 24-hour period.      At workplace 3. ONSET: "When did the COVID-19 symptoms start?"      Yesterday 4. WORST SYMPTOM: "What is your worst symptom?" (e.g., cough, fever, shortness of breath, muscle aches)     Cough 5. COUGH: "Do you have a cough?" If Yes, ask: "How bad is the cough?"       Yes, greenish mucous 6. FEVER: "Do you have a fever?" If Yes, ask: "What is your temperature, how was it measured, and when did it start?"     99.6 7. RESPIRATORY STATUS: "Describe your breathing?" (e.g., shortness of breath, wheezing, unable to speak)      Mild wheezing with coughing 8. BETTER-SAME-WORSE: "Are you getting better, staying the same or getting worse compared to yesterday?"  If getting worse, ask, "In what way?"     Worse 9. HIGH RISK DISEASE: "Do you have any chronic medical problems?" (e.g., asthma, heart or lung disease, weak immune system, obesity, etc.)     *No Answer* 10. VACCINE: "Have you had the COVID-19 vaccine?" If Yes, ask: "Which one, how many shots, when did you get it?"       Yes, Moderna, 2021 unsure exactly 11. BOOSTER: "Have you received your COVID-19 booster?" If Yes, ask: "Which one  and when did you get it?"       no  13. OTHER SYMPTOMS: "Do you have any other symptoms?"  (e.g., chills, fatigue, headache, loss of smell or taste, muscle pain, sore throat)       Sore throat, chest on fire 14. O2 SATURATION MONITOR:  "Do you use an oxygen saturation monitor (pulse oximeter) at home?" If Yes, ask "What is your reading (oxygen level) today?" "What is your usual oxygen saturation reading?" (e.g., 95%)       NA  Protocols used: Coronavirus (COVID-19) Diagnosed or Suspected-A-AH

## 2020-10-23 NOTE — Telephone Encounter (Signed)
Addressed in triage encounter

## 2020-10-23 NOTE — Telephone Encounter (Signed)
Pt covid positive

## 2020-10-24 ENCOUNTER — Other Ambulatory Visit: Payer: Self-pay

## 2020-10-24 ENCOUNTER — Other Ambulatory Visit: Payer: Self-pay | Admitting: Nurse Practitioner

## 2020-10-24 MED ORDER — MOLNUPIRAVIR EUA 200MG CAPSULE
4.0000 | ORAL_CAPSULE | Freq: Two times a day (BID) | ORAL | 0 refills | Status: AC
  Filled 2020-10-24: qty 40, 5d supply, fill #0

## 2020-10-24 MED ORDER — BENZONATATE 100 MG PO CAPS
100.0000 mg | ORAL_CAPSULE | Freq: Three times a day (TID) | ORAL | 0 refills | Status: DC
Start: 2020-10-24 — End: 2022-06-10
  Filled 2020-10-24: qty 30, 10d supply, fill #0

## 2020-10-24 MED ORDER — PROMETHAZINE-DM 6.25-15 MG/5ML PO SYRP
5.0000 mL | ORAL_SOLUTION | Freq: Four times a day (QID) | ORAL | 0 refills | Status: DC | PRN
  Filled 2020-10-24: qty 240, 12d supply, fill #0

## 2020-10-24 NOTE — Telephone Encounter (Signed)
Spoke with patient over the phone. She is aware of signs and symptoms to look for that would necessitate her going to the ER or urgent care. Molnupiravir has been sent.

## 2020-11-08 ENCOUNTER — Other Ambulatory Visit: Payer: Self-pay | Admitting: Nurse Practitioner

## 2020-11-08 DIAGNOSIS — I10 Essential (primary) hypertension: Secondary | ICD-10-CM

## 2020-11-09 ENCOUNTER — Other Ambulatory Visit: Payer: Self-pay

## 2020-11-13 ENCOUNTER — Other Ambulatory Visit: Payer: Self-pay

## 2020-11-13 MED ORDER — CARVEDILOL 12.5 MG PO TABS
12.5000 mg | ORAL_TABLET | Freq: Two times a day (BID) | ORAL | 2 refills | Status: DC
  Filled 2020-11-13: qty 60, 30d supply, fill #0
  Filled 2020-12-21: qty 60, 30d supply, fill #1
  Filled 2021-01-22: qty 60, 30d supply, fill #2

## 2020-11-14 ENCOUNTER — Other Ambulatory Visit: Payer: Self-pay

## 2020-11-14 NOTE — Progress Notes (Deleted)
[name] NOTE  Patient Care Team: Gildardo Pounds, NP as PCP - General (Nurse Practitioner)  CHIEF COMPLAINTS/PURPOSE OF CONSULTATION:  Leukocytosis.  ASSESSMENT & PLAN:  No problem-specific Assessment & Plan notes found for this encounter.  No orders of the defined types were placed in this encounter.  This is a very pleasant 45 yr old female patient with HTN referred to hematology with leukocytosis.  We have discussed about common causes of leukocytosis including infections, medications, smoking, obesity, autoimmune diseases, pain and rarely myeloproliferative disorders. She is here for FU with repeat labs.  Thank you for consulting [place] in the care of this patient.  Please not hesitate to contact us with any additional questions or concerns.   HISTORY OF PRESENTING ILLNESS:  Ambrea Nelson 45 y.o. female is here because of Leukocytosis.  This is a very pleasant 45 year old female patient with past medical history significant for hypertension referred to hematology for evaluation of leukocytosis.     REVIEW OF SYSTEMS:   Constitutional: Denies fevers, chills or abnormal night sweats Eyes: Denies blurriness of vision, double vision or watery eyes Ears, nose, mouth, throat, and face: Denies mucositis or sore throat Respiratory: Denies cough, dyspnea or wheezes Cardiovascular: Denies palpitation, chest discomfort or lower extremity swelling Gastrointestinal:  Denies nausea, heartburn or change in bowel habits Skin: Denies abnormal skin rashes Lymphatics: Denies new lymphadenopathy or easy bruising Neurological:Denies numbness, tingling or new weaknesses Behavioral/Psych: Mood is stable, no new changes  All other systems were reviewed with the patient and are negative.  MEDICAL HISTORY:  Past Medical History:  Diagnosis Date   Abscess    facial   Anemia    Headache    History of blood transfusion    Hypertension    PMH   Insomnia     SURGICAL  HISTORY: Past Surgical History:  Procedure Laterality Date   CESAREAN SECTION     C/S x 1   DILATATION & CURETTAGE/HYSTEROSCOPY WITH MYOSURE N/A 03/11/2017   Procedure: DILATATION & CURETTAGE/HYSTEROSCOPY WITH MYOSURE;  Surgeon: Emily Filbert, MD;  Location: [facility];  Service: Gynecology;  Laterality: N/A;   DILATION AND CURETTAGE OF UTERUS     TONSILLECTOMY      SOCIAL HISTORY: Social History   Socioeconomic History   Marital status: Single    Spouse name: Not on file   Number of children: Not on file   Years of education: Not on file   Highest education level: Not on file  Occupational History   Not on file  Tobacco Use   Smoking status: Some Days    Packs/day: 0.10    Types: Cigarettes   Smokeless tobacco: Never  Vaping Use   Vaping Use: Never used  Substance and Sexual Activity   Alcohol use: Yes    Comment: ocassinally    Drug use: No   Sexual activity: Yes    Birth control/protection: None  Other Topics Concern   Not on file  Social History Narrative   Not on file   Social Determinants of Health   Financial Resource Strain: Not on file  Food Insecurity: Not on file  Transportation Needs: Not on file  Physical Activity: Not on file  Stress: Not on file  Social Connections: Not on file  Intimate Partner Violence: Not on file    FAMILY HISTORY: Family History  Problem Relation Age of Onset   Hypertension Mother    Anemia Sister    Anemia Brother  Anemia Sister    Anemia Other    Hypertension Other     ALLERGIES:  is allergic to naprosyn [naproxen] and amlodipine.  MEDICATIONS:  Current Outpatient Medications  Medication Sig Dispense Refill   amLODipine (NORVASC) 10 MG tablet TAKE 1 TABLET (10 MG TOTAL) BY MOUTH DAILY. 30 tablet 0   atorvastatin (LIPITOR) 20 MG tablet TAKE 1 TABLET (20 MG TOTAL) BY MOUTH DAILY. 90 tablet 3   benzonatate (TESSALON) 100 MG capsule Take 1 capsule (100 mg total) by mouth every 8 (eight) hours. 30  capsule 0   carvedilol (COREG) 12.5 MG tablet Take 1 tablet (12.5 mg total) by mouth 2 (two) times daily with a meal. 60 tablet 2   cetirizine (ZYRTEC ALLERGY) 10 MG tablet Take 1 tablet (10 mg total) by mouth daily. 30 tablet 0   cyclobenzaprine (FLEXERIL) 10 MG tablet TAKE 1 TABLET (10 MG TOTAL) BY MOUTH 3 (THREE) TIMES DAILY AS NEEDED FOR MUSCLE SPASMS. 60 tablet 1   losartan (COZAAR) 100 MG tablet TAKE 1 TABLET (100 MG TOTAL) BY MOUTH DAILY. 30 tablet 0   megestrol (MEGACE) 40 MG tablet TAKE 0.5 TABLETS (20 MG TOTAL) BY MOUTH DAILY. 45 tablet 3   meloxicam (MOBIC) 15 MG tablet TAKE 1 TABLET (15 MG TOTAL) BY MOUTH DAILY WITH BREAKFAST. MAKE SURE YOU TAKE WITH FOOD 30 tablet 1   promethazine-dextromethorphan (PROMETHAZINE-DM) 6.25-15 MG/5ML syrup Take 5 mLs by mouth 4 (four) times daily as needed for cough. 240 mL 0   traZODone (DESYREL) 50 MG tablet TAKE 1 TABLET (50 MG TOTAL) BY MOUTH AT BEDTIME AS NEEDED FOR SLEEP. 30 tablet 1   No current facility-administered medications for this visit.    PHYSICAL EXAMINATION: ECOG PERFORMANCE STATUS: 0 - Asymptomatic  There were no vitals filed for this visit.  There were no vitals filed for this visit.   GENERAL:alert, no distress and comfortable, obese. SKIN: skin color, texture, turgor are normal, no rashes or significant lesions EYES: normal, conjunctiva are pink and non-injected, sclera clear OROPHARYNX:no exudate, no erythema and lips, buccal mucosa, and tongue normal  NECK: supple, thyroid normal size, non-tender, without nodularity LYMPH:  no palpable lymphadenopathy in the cervical, axillary LUNGS: clear to auscultation and percussion with normal breathing effort HEART: regular rate & rhythm and no murmurs and no lower extremity edema ABDOMEN:abdomen soft, non-tender and normal bowel sounds.  No hepatosplenomegaly. Musculoskeletal:no cyanosis of digits and no clubbing  PSYCH: alert & oriented x 3 with fluent speech NEURO: no focal  motor/sensory deficits  LABORATORY DATA:  I have reviewed the data as listed Lab Results  Component Value Date   WBC 13.0 (H) 10/18/2020   HGB 12.3 10/18/2020   HCT 36.1 10/18/2020   MCV 90.9 10/18/2020   PLT 263 10/18/2020     Chemistry      Component Value Date/Time   NA 143 10/04/2020 1134   K 3.8 10/04/2020 1134   CL 103 10/04/2020 1134   CO2 24 10/04/2020 1134   BUN 10 10/04/2020 1134   CREATININE 0.65 10/04/2020 1134      Component Value Date/Time   CALCIUM 9.3 10/04/2020 1134   ALKPHOS 76 10/04/2020 1134   AST 16 10/04/2020 1134   ALT 13 10/04/2020 1134   BILITOT 0.6 10/04/2020 1134     I have reviewed pertinent labs.   CBC showed 13K with ANC 9.3 K Smear review showed neutrophilia and leukocytosis. ESR 24, but age adjusted ESR is appropriate. ANA neg TSH normal.  RADIOGRAPHIC STUDIES: I have personally reviewed the radiological images as listed and agreed with the findings in the report. No results found.  All questions were answered. The patient knows to call the clinic with any problems, questions or concerns.    Benay Pike, MD 11/14/2020 3:20 PM

## 2020-11-15 ENCOUNTER — Inpatient Hospital Stay: Payer: Self-pay | Admitting: Hematology and Oncology

## 2020-11-15 ENCOUNTER — Telehealth: Payer: Self-pay | Admitting: Hematology and Oncology

## 2020-11-15 NOTE — Telephone Encounter (Signed)
Scheduled per sch msg. Called and spoke with patient. Confirmed   

## 2020-11-16 ENCOUNTER — Other Ambulatory Visit: Payer: Self-pay | Admitting: Nurse Practitioner

## 2020-11-16 ENCOUNTER — Other Ambulatory Visit: Payer: Self-pay

## 2020-11-16 DIAGNOSIS — I10 Essential (primary) hypertension: Secondary | ICD-10-CM

## 2020-11-16 MED ORDER — AMLODIPINE BESYLATE 10 MG PO TABS
ORAL_TABLET | Freq: Every day | ORAL | 0 refills | Status: DC
Start: 2020-11-16 — End: 2020-12-31
  Filled 2020-11-16: qty 30, 30d supply, fill #0

## 2020-11-20 ENCOUNTER — Other Ambulatory Visit: Payer: Self-pay

## 2020-11-27 ENCOUNTER — Inpatient Hospital Stay: Payer: Self-pay | Attending: Hematology and Oncology | Admitting: Hematology and Oncology

## 2020-11-27 ENCOUNTER — Other Ambulatory Visit: Payer: Self-pay

## 2020-11-27 ENCOUNTER — Encounter: Payer: Self-pay | Admitting: Hematology and Oncology

## 2020-11-27 VITALS — BP 175/123 | HR 81 | Temp 98.5°F | Resp 18 | Wt 258.7 lb

## 2020-11-27 DIAGNOSIS — D72829 Elevated white blood cell count, unspecified: Secondary | ICD-10-CM | POA: Insufficient documentation

## 2020-11-27 DIAGNOSIS — D72825 Bandemia: Secondary | ICD-10-CM

## 2020-11-27 DIAGNOSIS — Z8616 Personal history of COVID-19: Secondary | ICD-10-CM | POA: Insufficient documentation

## 2020-11-27 NOTE — Progress Notes (Signed)
[facility] NOTE  Patient Care Team: Gildardo Pounds, NP as PCP - General (Nurse Practitioner)  CHIEF COMPLAINTS/PURPOSE OF CONSULTATION:  Leukocytosis.  ASSESSMENT & PLAN:   This is a very pleasant 45 yr old female patient with HTN referred to hematology with leukocytosis.  No history of autoimmune diseases,ongoing infections, steroid use. I have reviewed her labs for the past 5 yrs, has had chronic leukocytosis, differential not available on all the labs but this is predominantly neutrophilia.  Most recent labs show mild leukocytosis again with neutrophilia.  ANA, age-adjusted ESR normal.  No evidence of hypo-/hyperthyroidism.  At this time, I do believe her leukocytosis is likely benign could be from chronic inflammation caused by obesity.  I have encouraged her to lose weight which she is already working on.  Since she is clinically asymptomatic, I would rather monitor her labs with repeat labs and follow-up in about 4 months.  She is very comfortable with this plan.  She was encouraged to contact us with any new questions or concerns. With regards to the shoulder pain, this is likely a muscle ache, she can consider taking nonsteroidal anti-inflammatory drugs like Aleve or ibuprofen a couple times a day for the next 3 days and use some warm compresses.  If she still continues to feel the shoulder pain, she was encouraged to talk to her primary care physician.  Thank you for consulting [place] in the care of this patient.  Please not hesitate to contact us with any additional questions or concerns.    HISTORY OF PRESENTING ILLNESS:   Meagan Nelson 45 y.o. female is here because of Leukocytosis.  This is a very pleasant 45 year old female patient with past medical history significant for hypertension referred to hematology for evaluation of leukocytosis.   Interval History Since last visit, no major changes.  She pulled her shoulder today while lifting someone at work but  overall doing well.  No interim infections or hospitalizations.  No B symptoms.  She had COVID about 3 weeks ago recovered.  No new medications. Rest of the pertinent 10 point ROS reviewed and negative.  REVIEW OF SYSTEMS:   Constitutional: Denies fevers, chills or abnormal night sweats Eyes: Denies blurriness of vision, double vision or watery eyes Ears, nose, mouth, throat, and face: Denies mucositis or sore throat Respiratory: Denies cough, dyspnea or wheezes Cardiovascular: Denies palpitation, chest discomfort or lower extremity swelling Gastrointestinal:  Denies nausea, heartburn or change in bowel habits Skin: Denies abnormal skin rashes Lymphatics: Denies new lymphadenopathy or easy bruising Neurological:Denies numbness, tingling or new weaknesses Behavioral/Psych: Mood is stable, no new changes  All other systems were reviewed with the patient and are negative.  MEDICAL HISTORY:  Past Medical History:  Diagnosis Date   Abscess    facial   Anemia    Headache    History of blood transfusion    Hypertension    PMH   Insomnia     SURGICAL HISTORY: Past Surgical History:  Procedure Laterality Date   CESAREAN SECTION     C/S x 1   DILATATION & CURETTAGE/HYSTEROSCOPY WITH MYOSURE N/A 03/11/2017   Procedure: DILATATION & CURETTAGE/HYSTEROSCOPY WITH MYOSURE;  Surgeon: Emily Filbert, MD;  Location: [facility];  Service: Gynecology;  Laterality: N/A;   DILATION AND CURETTAGE OF UTERUS     TONSILLECTOMY      SOCIAL HISTORY: Social History   Socioeconomic History   Marital status: Single    Spouse name: Not on  file   Number of children: Not on file   Years of education: Not on file   Highest education level: Not on file  Occupational History   Not on file  Tobacco Use   Smoking status: Some Days    Packs/day: 0.10    Types: Cigarettes   Smokeless tobacco: Never  Vaping Use   Vaping Use: Never used  Substance and Sexual Activity   Alcohol use: Yes     Comment: ocassinally    Drug use: No   Sexual activity: Yes    Birth control/protection: None  Other Topics Concern   Not on file  Social History Narrative   Not on file   Social Determinants of Health   Financial Resource Strain: Not on file  Food Insecurity: Not on file  Transportation Needs: Not on file  Physical Activity: Not on file  Stress: Not on file  Social Connections: Not on file  Intimate Partner Violence: Not on file    FAMILY HISTORY: Family History  Problem Relation Age of Onset   Hypertension Mother    Anemia Sister    Anemia Brother    Anemia Sister    Anemia Other    Hypertension Other     ALLERGIES:  is allergic to naprosyn [naproxen] and amlodipine.  MEDICATIONS:  Current Outpatient Medications  Medication Sig Dispense Refill   amLODipine (NORVASC) 10 MG tablet TAKE 1 TABLET (10 MG TOTAL) BY MOUTH DAILY. 30 tablet 0   atorvastatin (LIPITOR) 20 MG tablet TAKE 1 TABLET (20 MG TOTAL) BY MOUTH DAILY. 90 tablet 3   benzonatate (TESSALON) 100 MG capsule Take 1 capsule (100 mg total) by mouth every 8 (eight) hours. 30 capsule 0   carvedilol (COREG) 12.5 MG tablet Take 1 tablet (12.5 mg total) by mouth 2 (two) times daily with a meal. 60 tablet 2   cetirizine (ZYRTEC ALLERGY) 10 MG tablet Take 1 tablet (10 mg total) by mouth daily. 30 tablet 0   cyclobenzaprine (FLEXERIL) 10 MG tablet TAKE 1 TABLET (10 MG TOTAL) BY MOUTH 3 (THREE) TIMES DAILY AS NEEDED FOR MUSCLE SPASMS. 60 tablet 1   losartan (COZAAR) 100 MG tablet TAKE 1 TABLET (100 MG TOTAL) BY MOUTH DAILY. 30 tablet 0   megestrol (MEGACE) 40 MG tablet TAKE 0.5 TABLETS (20 MG TOTAL) BY MOUTH DAILY. 45 tablet 3   meloxicam (MOBIC) 15 MG tablet TAKE 1 TABLET (15 MG TOTAL) BY MOUTH DAILY WITH BREAKFAST. MAKE SURE YOU TAKE WITH FOOD 30 tablet 1   promethazine-dextromethorphan (PROMETHAZINE-DM) 6.25-15 MG/5ML syrup Take 5 mLs by mouth 4 (four) times daily as needed for cough. 240 mL 0   traZODone (DESYREL) 50  MG tablet TAKE 1 TABLET (50 MG TOTAL) BY MOUTH AT BEDTIME AS NEEDED FOR SLEEP. 30 tablet 1   No current facility-administered medications for this visit.    PHYSICAL EXAMINATION: ECOG PERFORMANCE STATUS: 0 - Asymptomatic  Vitals:   11/27/20 1452  BP: (!) 175/123  Pulse: 81  Resp: 18  Temp: 98.5 F (36.9 C)  SpO2: 96%    Filed Weights   11/27/20 1452  Weight: 258 lb 11.2 oz (117.3 kg)     GENERAL:alert, no distress and comfortable, obese. Focused exam: No swelling in the left scapula where patient points regarding the pain.  She is able to move her shoulder with some discomfort.  LABORATORY DATA:  I have reviewed the data as listed Lab Results  Component Value Date   WBC 13.0 (H) 10/18/2020  HGB 12.3 10/18/2020   HCT 36.1 10/18/2020   MCV 90.9 10/18/2020   PLT 263 10/18/2020     Chemistry      Component Value Date/Time   NA 143 10/04/2020 1134   K 3.8 10/04/2020 1134   CL 103 10/04/2020 1134   CO2 24 10/04/2020 1134   BUN 10 10/04/2020 1134   CREATININE 0.65 10/04/2020 1134      Component Value Date/Time   CALCIUM 9.3 10/04/2020 1134   ALKPHOS 76 10/04/2020 1134   AST 16 10/04/2020 1134   ALT 13 10/04/2020 1134   BILITOT 0.6 10/04/2020 1134     I have reviewed pertinent labs.  She has always had mild leukocytosis over the past 3 years.  Predominantly neutrophilia.  Labs from 10/18/2020 shows a white blood cell count of 13,000 again with predominantly neutrophils.  No basophilia noted no thrombocytosis noted.    ESR 24, age-adjusted ESR is normal TSH is normal. ANA negative  RADIOGRAPHIC STUDIES: I have personally reviewed the radiological images as listed and agreed with the findings in the report. No results found.  All questions were answered. The patient knows to call the clinic with any problems, questions or concerns. I spent 20 minutes in the care of this patient including H and P, review of records, counseling and coordination of care.      Benay Pike, MD 11/27/2020 3:17 PM

## 2020-11-28 ENCOUNTER — Other Ambulatory Visit: Payer: Self-pay | Admitting: Nurse Practitioner

## 2020-11-28 ENCOUNTER — Other Ambulatory Visit: Payer: Self-pay

## 2020-11-28 DIAGNOSIS — I10 Essential (primary) hypertension: Secondary | ICD-10-CM

## 2020-11-28 MED ORDER — LOSARTAN POTASSIUM 100 MG PO TABS
ORAL_TABLET | Freq: Every day | ORAL | 0 refills | Status: DC
  Filled 2020-11-28: qty 30, 30d supply, fill #0

## 2020-11-29 ENCOUNTER — Other Ambulatory Visit: Payer: Self-pay

## 2020-12-04 ENCOUNTER — Other Ambulatory Visit: Payer: Self-pay

## 2020-12-05 ENCOUNTER — Other Ambulatory Visit: Payer: Self-pay

## 2020-12-21 ENCOUNTER — Other Ambulatory Visit: Payer: Self-pay

## 2020-12-31 ENCOUNTER — Other Ambulatory Visit: Payer: Self-pay

## 2020-12-31 ENCOUNTER — Other Ambulatory Visit: Payer: Self-pay | Admitting: Nurse Practitioner

## 2020-12-31 DIAGNOSIS — I10 Essential (primary) hypertension: Secondary | ICD-10-CM

## 2020-12-31 MED ORDER — AMLODIPINE BESYLATE 10 MG PO TABS
ORAL_TABLET | Freq: Every day | ORAL | 0 refills | Status: DC
Start: 2020-12-31 — End: 2021-05-08
  Filled 2020-12-31: qty 30, 30d supply, fill #0

## 2020-12-31 MED ORDER — LOSARTAN POTASSIUM 100 MG PO TABS
ORAL_TABLET | Freq: Every day | ORAL | 0 refills | Status: DC
  Filled 2020-12-31: qty 30, 30d supply, fill #0

## 2020-12-31 NOTE — Telephone Encounter (Signed)
Requested Prescriptions  Pending Prescriptions Disp Refills  . losartan (COZAAR) 100 MG tablet 30 tablet 0    Sig: TAKE 1 TABLET (100 MG TOTAL) BY MOUTH DAILY.     Cardiovascular:  Angiotensin Receptor Blockers Failed - 12/31/2020 10:28 AM      Failed - Last BP in normal range    BP Readings from Last 1 Encounters:  11/27/20 (!) 175/123         Passed - Cr in normal range and within 180 days    Creatinine, Ser  Date Value Ref Range Status  10/04/2020 0.65 0.57 - 1.00 mg/dL Final   Creatinine, Urine  Date Value Ref Range Status  03/12/2009 52.9 mg/dL Final         Passed - K in normal range and within 180 days    Potassium  Date Value Ref Range Status  10/04/2020 3.8 3.5 - 5.2 mmol/L Final         Passed - Patient is not pregnant      Passed - Valid encounter within last 6 months    Recent Outpatient Visits          2 months ago Dyslipidemia, goal LDL below 100   [facility] [place], Cornelius Moras, RPH-CPP   3 months ago Leukocytosis, unspecified type   [facility] [place], Shea Stakes, NP   7 months ago Essential hypertension   [facility] [place], Cornelius Moras, RPH-CPP   7 months ago Essential hypertension   [facility] [place], RPH-CPP   8 months ago Essential hypertension   [facility] [place], Jeannett Senior L, RPH-CPP             . amLODipine (NORVASC) 10 MG tablet 30 tablet 0    Sig: TAKE 1 TABLET (10 MG TOTAL) BY MOUTH DAILY.     Cardiovascular:  Calcium Channel Blockers Failed - 12/31/2020 10:28 AM      Failed - Last BP in normal range    BP Readings from Last 1 Encounters:  11/27/20 (!) 175/123         Passed - Valid encounter within last 6 months    Recent Outpatient Visits          2 months ago Dyslipidemia, goal LDL below 100   [facility] [place],  Jeannett Senior L, RPH-CPP   3 months ago Leukocytosis, unspecified type   [facility] [place], Shea Stakes, NP   7 months ago Essential hypertension   [facility] [place], Cornelius Moras, RPH-CPP   7 months ago Essential hypertension   [facility] Drucilla Chalet, RPH-CPP   8 months ago Essential hypertension   [facility] Dru[place] Chalet, RPH-CPP

## 2021-01-02 ENCOUNTER — Other Ambulatory Visit: Payer: Self-pay

## 2021-01-13 ENCOUNTER — Emergency Department (HOSPITAL_COMMUNITY)
Admission: EM | Admit: 2021-01-13 | Discharge: 2021-01-13 | Discharge status: Against Medical Advice | Payer: Medicaid Other

## 2021-01-13 NOTE — ED Notes (Signed)
Per registration pt left

## 2021-01-14 ENCOUNTER — Other Ambulatory Visit: Payer: Self-pay

## 2021-01-14 ENCOUNTER — Ambulatory Visit: Payer: Self-pay | Admitting: *Deleted

## 2021-01-14 NOTE — Telephone Encounter (Signed)
Called pt left VM to refer to ED /UC for evaluation

## 2021-01-14 NOTE — Telephone Encounter (Signed)
Reason for Disposition  [1] SEVERE pain (e.g., excruciating, unable to do any normal activities) AND [2] not improved 2 hours after pain medicine  Answer Assessment - Initial Assessment Questions 1. LOCATION: "Which tooth is hurting?"  (e.g., right-side/left-side, upper/lower, front/back)     R lower molar  2. ONSET: "When did the toothache start?"  (e.g., hours, days)      Sunday- patient woke with pain ans swelling 3. SEVERITY: "How bad is the toothache?"  (Scale 1-10; mild, moderate or severe)   - MILD (1-3): doesn't interfere with chewing    - MODERATE (4-7): interferes with chewing, interferes with normal activities, awakens from sleep     - SEVERE (8-10): unable to eat, unable to do any normal activities, excruciating pain        severe 4. SWELLING: "Is there any visible swelling of your face?"     yes 5. OTHER SYMPTOMS: "Do you have any other symptoms?" (e.g., fever)     No- face is warm 6. PREGNANCY: "Is there any chance you are pregnant?" "When was your last menstrual period?"     No- megace  Protocols used: Toothache-A-AH

## 2021-01-14 NOTE — Telephone Encounter (Signed)
Patient is calling to report she has abscess in her mouth- RL molar . Pain and facial swelling with heat in face. Advised UC/ED for evaluation. Patient states she does not have dentist- she just got dental coverage.

## 2021-01-15 ENCOUNTER — Other Ambulatory Visit: Payer: Self-pay

## 2021-01-22 ENCOUNTER — Other Ambulatory Visit: Payer: Self-pay

## 2021-01-23 ENCOUNTER — Other Ambulatory Visit: Payer: Self-pay

## 2021-02-01 ENCOUNTER — Other Ambulatory Visit: Payer: Self-pay

## 2021-02-11 ENCOUNTER — Other Ambulatory Visit: Payer: Self-pay | Admitting: Nurse Practitioner

## 2021-02-11 DIAGNOSIS — I10 Essential (primary) hypertension: Secondary | ICD-10-CM

## 2021-02-12 NOTE — Telephone Encounter (Signed)
Requested medications are due for refill today yes  Requested medications are on the active medication list yes  Last refill 01/02/21  Last visit 09/24/20 was asked to return in October.  Future visit scheduled No  Notes to clinic Has already had a curtesy refill and there is no upcoming appointment scheduled. Requested Prescriptions  Pending Prescriptions Disp Refills   amLODipine (NORVASC) 10 MG tablet 30 tablet 0    Sig: TAKE 1 TABLET (10 MG TOTAL) BY MOUTH DAILY.     Cardiovascular:  Calcium Channel Blockers Failed - 02/11/2021  5:58 AM      Failed - Last BP in normal range    BP Readings from Last 1 Encounters:  11/27/20 (!) 175/123          Passed - Valid encounter within last 6 months    Recent Outpatient Visits           4 months ago Dyslipidemia, goal LDL below 100   [facility] [place], Jeannett Senior L, RPH-CPP   4 months ago Leukocytosis, unspecified type   [facility] [place], Shea Stakes, NP   8 months ago Essential hypertension   [facility] [place], Cornelius Moras, RPH-CPP   9 months ago Essential hypertension   [facility] [place] Chalet, RPH-CPP   10 months ago Essential hypertension   [facility] Dru[place]illa Chalet, RPH-CPP

## 2021-02-15 ENCOUNTER — Other Ambulatory Visit: Payer: Self-pay

## 2021-02-21 ENCOUNTER — Other Ambulatory Visit: Payer: Self-pay

## 2021-02-22 ENCOUNTER — Other Ambulatory Visit: Payer: Self-pay

## 2021-02-22 ENCOUNTER — Other Ambulatory Visit: Payer: Self-pay | Admitting: Nurse Practitioner

## 2021-02-22 DIAGNOSIS — I10 Essential (primary) hypertension: Secondary | ICD-10-CM

## 2021-02-22 MED ORDER — LOSARTAN POTASSIUM 100 MG PO TABS
ORAL_TABLET | Freq: Every day | ORAL | 0 refills | Status: DC
Start: 2021-02-22 — End: 2021-03-26
  Filled 2021-02-22: qty 30, 30d supply, fill #0

## 2021-02-22 NOTE — Telephone Encounter (Signed)
Attempted to call patient to schedule follow up appointment- no answer and mailbox full. Note added to Rx- filled per protocol

## 2021-02-25 ENCOUNTER — Other Ambulatory Visit: Payer: Self-pay

## 2021-03-05 ENCOUNTER — Other Ambulatory Visit: Payer: Self-pay

## 2021-03-05 NOTE — Telephone Encounter (Signed)
Called patient and she made an appt for feb . That is the first with Dr. Meredeth Ide.  She needs her refills now.  CB#  [phone]

## 2021-03-06 ENCOUNTER — Other Ambulatory Visit: Payer: Self-pay

## 2021-03-26 ENCOUNTER — Other Ambulatory Visit: Payer: Self-pay | Admitting: Nurse Practitioner

## 2021-03-26 ENCOUNTER — Other Ambulatory Visit: Payer: Self-pay | Admitting: Family Medicine

## 2021-03-26 ENCOUNTER — Other Ambulatory Visit: Payer: Self-pay

## 2021-03-26 DIAGNOSIS — I10 Essential (primary) hypertension: Secondary | ICD-10-CM

## 2021-03-26 MED ORDER — LOSARTAN POTASSIUM 100 MG PO TABS
ORAL_TABLET | Freq: Every day | ORAL | 0 refills | Status: DC
  Filled 2021-03-26: qty 30, 30d supply, fill #0

## 2021-03-26 MED ORDER — CARVEDILOL 12.5 MG PO TABS
12.5000 mg | ORAL_TABLET | Freq: Two times a day (BID) | ORAL | 2 refills | Status: DC
  Filled 2021-03-26 (×2): qty 60, 30d supply, fill #0
  Filled 2021-05-01: qty 60, 30d supply, fill #1
  Filled 2021-06-05: qty 60, 30d supply, fill #2

## 2021-03-26 NOTE — Telephone Encounter (Signed)
Requested Prescriptions  Pending Prescriptions Disp Refills   losartan (COZAAR) 100 MG tablet 30 tablet 0    Sig: TAKE 1 TABLET (100 MG TOTAL) BY MOUTH DAILY.     Cardiovascular:  Angiotensin Receptor Blockers Failed - 03/26/2021  9:55 AM      Failed - Last BP in normal range    BP Readings from Last 1 Encounters:  11/27/20 (!) 175/123         Passed - Cr in normal range and within 180 days    Creatinine, Ser  Date Value Ref Range Status  10/04/2020 0.65 0.57 - 1.00 mg/dL Final   Creatinine, Urine  Date Value Ref Range Status  03/12/2009 52.9 mg/dL Final         Passed - K in normal range and within 180 days    Potassium  Date Value Ref Range Status  10/04/2020 3.8 3.5 - 5.2 mmol/L Final         Passed - Patient is not pregnant      Passed - Valid encounter within last 6 months    Recent Outpatient Visits          5 months ago Dyslipidemia, goal LDL below 100   [facility] [place], Jeannett Senior L, RPH-CPP   6 months ago Leukocytosis, unspecified type   [facility] [place], Shea Stakes, NP   9 months ago Essential hypertension   [facility] [place], Cornelius Moras, RPH-CPP   10 months ago Essential hypertension   [facility] [place], Cornelius Moras, RPH-CPP   11 months ago Essential hypertension   [facility] [place] Chalet, RPH-CPP      Future Appointments            In 1 month Claiborne Rigg, NP [facility] [facility]

## 2021-03-26 NOTE — Telephone Encounter (Signed)
Requested Prescriptions  Pending Prescriptions Disp Refills   carvedilol (COREG) 12.5 MG tablet 60 tablet 2    Sig: Take 1 tablet (12.5 mg total) by mouth 2 (two) times daily with a meal.     Cardiovascular:  Beta Blockers Failed - 03/26/2021  9:38 AM      Failed - Last BP in normal range    BP Readings from Last 1 Encounters:  11/27/20 (!) 175/123         Passed - Last Heart Rate in normal range    Pulse Readings from Last 1 Encounters:  11/27/20 81         Passed - Valid encounter within last 6 months    Recent Outpatient Visits          5 months ago Dyslipidemia, goal LDL below 100   [facility] [place], Jeannett Senior L, RPH-CPP   6 months ago Leukocytosis, unspecified type   [facility] [place], Shea Stakes, NP   9 months ago Essential hypertension   [facility] [place], Cornelius Moras, RPH-CPP   10 months ago Essential hypertension   [facility] [place], Cornelius Moras, RPH-CPP   11 months ago Essential hypertension   [facility] Dru[place]illa Chalet, RPH-CPP      Future Appointments            In 1 month Claiborne Rigg, NP [facility] [employer] [facility]

## 2021-03-29 ENCOUNTER — Ambulatory Visit: Payer: Medicaid Other | Admitting: Hematology and Oncology

## 2021-03-29 ENCOUNTER — Other Ambulatory Visit: Payer: Medicaid Other

## 2021-04-02 ENCOUNTER — Other Ambulatory Visit: Payer: Self-pay

## 2021-05-01 ENCOUNTER — Other Ambulatory Visit: Payer: Self-pay | Admitting: Nurse Practitioner

## 2021-05-01 DIAGNOSIS — I10 Essential (primary) hypertension: Secondary | ICD-10-CM

## 2021-05-02 ENCOUNTER — Other Ambulatory Visit: Payer: Self-pay

## 2021-05-02 MED ORDER — LOSARTAN POTASSIUM 100 MG PO TABS
ORAL_TABLET | Freq: Every day | ORAL | 0 refills | Status: DC
  Filled 2021-05-02: qty 30, 30d supply, fill #0

## 2021-05-02 NOTE — Telephone Encounter (Signed)
Requested medications are due for refill today.  yes  Requested medications are on the active medications list.  yes  Last refill. 03/26/2021 #30 0 refills   Future visit scheduled.   yes  Notes to clinic.  Refill failed protocol d/t expired labs.    Requested Prescriptions  Pending Prescriptions Disp Refills   losartan (COZAAR) 100 MG tablet 30 tablet 0    Sig: TAKE 1 TABLET (100 MG TOTAL) BY MOUTH DAILY.     Cardiovascular:  Angiotensin Receptor Blockers Failed - 05/01/2021  8:58 PM      Failed - Cr in normal range and within 180 days    Creatinine, Ser  Date Value Ref Range Status  10/04/2020 0.65 0.57 - 1.00 mg/dL Final   Creatinine, Urine  Date Value Ref Range Status  03/12/2009 52.9 mg/dL Final          Failed - K in normal range and within 180 days    Potassium  Date Value Ref Range Status  10/04/2020 3.8 3.5 - 5.2 mmol/L Final          Failed - Last BP in normal range    BP Readings from Last 1 Encounters:  11/27/20 (!) 175/123          Failed - Valid encounter within last 6 months    Recent Outpatient Visits           7 months ago Dyslipidemia, goal LDL below [place], Jarome Matin, RPH-CPP   7 months ago Leukocytosis, unspecified type   [place], Vernia Buff, NP   11 months ago Essential hypertension   [place], Jarome Matin, RPH-CPP   11 months ago Essential hypertension   [place], Stephen L, RPH-CPP   1 year ago Essential hypertension   [place], RPH-CPP       Future Appointments             In 6 days Gildardo Pounds, NP [place] - Patient is not pregnant

## 2021-05-03 ENCOUNTER — Other Ambulatory Visit: Payer: Self-pay

## 2021-05-06 ENCOUNTER — Other Ambulatory Visit: Payer: Self-pay

## 2021-05-08 ENCOUNTER — Other Ambulatory Visit: Payer: Self-pay

## 2021-05-08 ENCOUNTER — Encounter: Payer: Self-pay | Admitting: Nurse Practitioner

## 2021-05-08 ENCOUNTER — Ambulatory Visit: Payer: Self-pay | Attending: Nurse Practitioner | Admitting: Nurse Practitioner

## 2021-05-08 VITALS — BP 177/130 | HR 84 | Resp 20 | Ht 66.0 in | Wt 264.5 lb

## 2021-05-08 DIAGNOSIS — Z1211 Encounter for screening for malignant neoplasm of colon: Secondary | ICD-10-CM

## 2021-05-08 DIAGNOSIS — D72829 Elevated white blood cell count, unspecified: Secondary | ICD-10-CM | POA: Insufficient documentation

## 2021-05-08 DIAGNOSIS — I10 Essential (primary) hypertension: Secondary | ICD-10-CM | POA: Insufficient documentation

## 2021-05-08 DIAGNOSIS — Z79899 Other long term (current) drug therapy: Secondary | ICD-10-CM | POA: Insufficient documentation

## 2021-05-08 MED ORDER — VALSARTAN 160 MG PO TABS
160.0000 mg | ORAL_TABLET | Freq: Every day | ORAL | 3 refills | Status: DC
  Filled 2021-05-08: qty 30, 30d supply, fill #0
  Filled 2021-06-11: qty 30, 30d supply, fill #1
  Filled 2021-07-12: qty 30, 30d supply, fill #2
  Filled 2021-08-13: qty 30, 30d supply, fill #3
  Filled 2021-09-11: qty 30, 30d supply, fill #4
  Filled 2021-10-08: qty 30, 30d supply, fill #5
  Filled 2021-11-12: qty 30, 30d supply, fill #6
  Filled 2021-12-12: qty 30, 30d supply, fill #7
  Filled 2022-01-10: qty 30, 30d supply, fill #8
  Filled 2022-02-12: qty 30, 30d supply, fill #9
  Filled 2022-03-12: qty 30, 30d supply, fill #10

## 2021-05-08 MED ORDER — AMLODIPINE BESYLATE 10 MG PO TABS
10.0000 mg | ORAL_TABLET | Freq: Every day | ORAL | 1 refills | Status: DC
  Filled 2021-05-08: qty 30, 30d supply, fill #0
  Filled 2021-06-11: qty 30, 30d supply, fill #1
  Filled 2021-07-12 (×2): qty 30, 30d supply, fill #2
  Filled 2021-08-23: qty 30, 30d supply, fill #3
  Filled 2021-09-27: qty 30, 30d supply, fill #4
  Filled 2021-11-12: qty 30, 30d supply, fill #5

## 2021-05-08 NOTE — Progress Notes (Signed)
Assessment & Plan:  Meagan Nelson was seen today for hypertension.  Diagnoses and all orders for this visit:  Essential hypertension -     valsartan (DIOVAN) 160 MG tablet; Take 1 tablet (160 mg total) by mouth daily. -     amLODipine (NORVASC) 10 MG tablet; Take 1 tablet (10 mg total) by mouth daily. -     CMP14+EGFR  Colon cancer screening -     Fecal occult blood, imunochemical  Leukocytosis, unspecified type -     CBC with Differential    Patient has been counseled on age-appropriate routine health concerns for screening and prevention. These are reviewed and up-to-date. Referrals have been placed accordingly. Immunizations are up-to-date or declined.    Subjective:   Chief Complaint  Patient presents with   Hypertension    Meagan Nelson 46 y.o. female presents to office today for follow-up to hypertension.   Blood pressure is poorly controlled and she endorses adherence to taking carvedilol 12.5 mg twice daily and losartan 100 mg daily.  She has been out of her amlodipine 10 mg daily however her blood pressure was elevated even when she endorses adherence taking all 3 blood pressure medications.  We will switch losartan to valsartan today and have her follow-up for blood pressure recheck.  She states her blood pressures are lower outside of the office however when she recalls her home blood pressure readings they are just as high (170/80-90s) BP Readings from Last 3 Encounters:  05/08/21 (!) 177/130  11/27/20 (!) 175/123  10/18/20 (!) 162/117      Review of Systems  Constitutional:  Negative for fever, malaise/fatigue and weight loss.  HENT: Negative.  Negative for nosebleeds.   Eyes: Negative.  Negative for blurred vision, double vision and photophobia.  Respiratory: Negative.  Negative for cough and shortness of breath.   Cardiovascular: Negative.  Negative for chest pain, palpitations and leg swelling.  Gastrointestinal: Negative.  Negative for heartburn, nausea and  vomiting.  Musculoskeletal: Negative.  Negative for myalgias.  Neurological: Negative.  Negative for dizziness, focal weakness, seizures and headaches.  Psychiatric/Behavioral: Negative.  Negative for suicidal ideas.    Past Medical History:  Diagnosis Date   Abscess    facial   Anemia    Headache    History of blood transfusion    Hypertension    PMH   Insomnia     Past Surgical History:  Procedure Laterality Date   CESAREAN SECTION     C/S x 1   DILATATION & CURETTAGE/HYSTEROSCOPY WITH MYOSURE N/A 03/11/2017   Procedure: DILATATION & CURETTAGE/HYSTEROSCOPY WITH MYOSURE;  Surgeon: Emily Filbert, MD;  Location: [facility];  Service: Gynecology;  Laterality: N/A;   DILATION AND CURETTAGE OF UTERUS     TONSILLECTOMY      Family History  Problem Relation Age of Onset   Hypertension Mother    Anemia Sister    Anemia Brother    Anemia Sister    Anemia Other    Hypertension Other     Social History Reviewed with no changes to be made today.   Outpatient Medications Prior to Visit  Medication Sig Dispense Refill   atorvastatin (LIPITOR) 20 MG tablet TAKE 1 TABLET (20 MG TOTAL) BY MOUTH DAILY. 90 tablet 3   benzonatate (TESSALON) 100 MG capsule Take 1 capsule (100 mg total) by mouth every 8 (eight) hours. 30 capsule 0   carvedilol (COREG) 12.5 MG tablet Take 1 tablet (12.5 mg total) by mouth 2 (  two) times daily with a meal. 60 tablet 2   cetirizine (ZYRTEC ALLERGY) 10 MG tablet Take 1 tablet (10 mg total) by mouth daily. 30 tablet 0   cyclobenzaprine (FLEXERIL) 10 MG tablet TAKE 1 TABLET (10 MG TOTAL) BY MOUTH 3 (THREE) TIMES DAILY AS NEEDED FOR MUSCLE SPASMS. 60 tablet 1   megestrol (MEGACE) 40 MG tablet TAKE 0.5 TABLETS (20 MG TOTAL) BY MOUTH DAILY. 45 tablet 3   traZODone (DESYREL) 50 MG tablet TAKE 1 TABLET (50 MG TOTAL) BY MOUTH AT BEDTIME AS NEEDED FOR SLEEP. 30 tablet 1   amLODipine (NORVASC) 10 MG tablet TAKE 1 TABLET (10 MG TOTAL) BY MOUTH DAILY. 30  tablet 0   losartan (COZAAR) 100 MG tablet TAKE 1 TABLET (100 MG TOTAL) BY MOUTH DAILY. 30 tablet 0   promethazine-dextromethorphan (PROMETHAZINE-DM) 6.25-15 MG/5ML syrup Take 5 mLs by mouth 4 (four) times daily as needed for cough. 240 mL 0   No facility-administered medications prior to visit.    Allergies  Allergen Reactions   Naprosyn [Naproxen] Nausea And Vomiting   Amlodipine Swelling       Objective:    BP (!) 177/130    Pulse 84    Resp 20    Ht $R'5\' 6"'Jc$  (1.676 m)    Wt 264 lb 8 oz (120 kg)    SpO2 99%    BMI 42.69 kg/m  Wt Readings from Last 3 Encounters:  05/08/21 264 lb 8 oz (120 kg)  11/27/20 258 lb 11.2 oz (117.3 kg)  10/18/20 256 lb 1.6 oz (116.2 kg)    Physical Exam Vitals and nursing note reviewed.  Constitutional:      Appearance: She is well-developed.  HENT:     Head: Normocephalic and atraumatic.  Cardiovascular:     Rate and Rhythm: Normal rate and regular rhythm.     Heart sounds: Normal heart sounds. No murmur heard.   No friction rub. No gallop.  Pulmonary:     Effort: Pulmonary effort is normal. No tachypnea or respiratory distress.     Breath sounds: Normal breath sounds. No decreased breath sounds, wheezing, rhonchi or rales.  Chest:     Chest wall: No tenderness.  Abdominal:     General: Bowel sounds are normal.     Palpations: Abdomen is soft.  Musculoskeletal:        General: Normal range of motion.     Cervical back: Normal range of motion.  Skin:    General: Skin is warm and dry.  Neurological:     Mental Status: She is alert and oriented to person, place, and time.     Coordination: Coordination normal.  Psychiatric:        Behavior: Behavior normal. Behavior is cooperative.        Thought Content: Thought content normal.        Judgment: Judgment normal.         Patient has been counseled extensively about nutrition and exercise as well as the importance of adherence with medications and regular follow-up. The patient was given  clear instructions to go to ER or return to medical center if symptoms don't improve, worsen or new problems develop. The patient verbalized understanding.   Follow-up: Return in about 7 weeks (around 06/26/2021) for BP CHECK WITH LUKE.   Gildardo Pounds, FNP-BC [facility] [place]   05/08/2021, 12:22 PM

## 2021-05-09 LAB — CMP14+EGFR
ALT: 17 IU/L (ref 0–32)
AST: 19 IU/L (ref 0–40)
Albumin/Globulin Ratio: 1.9 (ref 1.2–2.2)
Albumin: 4.2 g/dL (ref 3.8–4.8)
Alkaline Phosphatase: 74 IU/L (ref 44–121)
BUN/Creatinine Ratio: 16 (ref 9–23)
BUN: 11 mg/dL (ref 6–24)
Bilirubin Total: 0.7 mg/dL (ref 0.0–1.2)
CO2: 25 mmol/L (ref 20–29)
Calcium: 9 mg/dL (ref 8.7–10.2)
Chloride: 102 mmol/L (ref 96–106)
Creatinine, Ser: 0.67 mg/dL (ref 0.57–1.00)
Globulin, Total: 2.2 g/dL (ref 1.5–4.5)
Glucose: 87 mg/dL (ref 70–99)
Potassium: 3.3 mmol/L — ABNORMAL LOW (ref 3.5–5.2)
Sodium: 140 mmol/L (ref 134–144)
Total Protein: 6.4 g/dL (ref 6.0–8.5)
eGFR: 109 mL/min/{1.73_m2} (ref >59)

## 2021-05-09 LAB — CBC WITH DIFFERENTIAL/PLATELET
Basophils Absolute: 0.1 10*3/uL (ref 0.0–0.2)
Basos: 1 %
EOS (ABSOLUTE): 0.4 10*3/uL (ref 0.0–0.4)
Eos: 4 %
Hematocrit: 42.2 % (ref 34.0–46.6)
Hemoglobin: 14.3 g/dL (ref 11.1–15.9)
Immature Grans (Abs): 0 10*3/uL (ref 0.0–0.1)
Immature Granulocytes: 0 %
Lymphocytes Absolute: 2.7 10*3/uL (ref 0.7–3.1)
Lymphs: 25 %
MCH: 31.2 pg (ref 26.6–33.0)
MCHC: 33.9 g/dL (ref 31.5–35.7)
MCV: 92 fL (ref 79–97)
Monocytes Absolute: 0.8 10*3/uL (ref 0.1–0.9)
Monocytes: 8 %
Neutrophils Absolute: 6.8 10*3/uL (ref 1.4–7.0)
Neutrophils: 62 %
Platelets: 216 10*3/uL (ref 150–450)
RBC: 4.58 x10E6/uL (ref 3.77–5.28)
RDW: 13.9 % (ref 11.7–15.4)
WBC: 10.8 10*3/uL (ref 3.4–10.8)

## 2021-05-13 ENCOUNTER — Other Ambulatory Visit: Payer: Self-pay

## 2021-05-30 ENCOUNTER — Other Ambulatory Visit: Payer: Self-pay

## 2021-05-31 ENCOUNTER — Other Ambulatory Visit: Payer: Self-pay

## 2021-06-06 ENCOUNTER — Other Ambulatory Visit: Payer: Self-pay

## 2021-06-07 ENCOUNTER — Other Ambulatory Visit: Payer: Self-pay

## 2021-06-12 ENCOUNTER — Other Ambulatory Visit: Payer: Self-pay

## 2021-06-13 ENCOUNTER — Other Ambulatory Visit: Payer: Self-pay

## 2021-06-25 ENCOUNTER — Telehealth: Payer: Self-pay | Admitting: Pharmacist

## 2021-06-25 DIAGNOSIS — I10 Essential (primary) hypertension: Secondary | ICD-10-CM

## 2021-06-25 NOTE — Patient Outreach (Signed)
Patient appearing on report for [employer] - Hypertension Control report due to last documented ambulatory blood pressure of 177/130  on 05/08/2021. Next appointment with PCP is not scheduled - but has visit with embedded pharmacist on 06/27/21  ? ?Outreached patient to discuss hypertension control and medication management.  ? ?Current medications: valsartan 160 mg daily, amlodipine 10 mg daily, carvedilol 12.5 mg twice daily ? ?Patient does not have an automated upper arm home BP machine. Has a wrist cuff at home, reports she is checking in her car right before she goes into work ? ?Current blood pressure readings: 130-150s/70-80s ? ?Current meal patterns: does not add salt to her food, uses a non-salt seasoning; doesn't eat out a lot; doesn't like fruits, but does eat a lot of veggies - fresh and frozen;  ? 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  ? ?Patient denies hypotensive signs and symptoms including dizziness, lightheadedness.  ?Patient denies hypertensive symptoms including headache, chest pain, shortness of breath ? ?Patient reports side effects related to amlodipine - does report swelling in her feet/ankles  ? ?Medications Reviewed Today   ? ? Reviewed by Alden Hipp, RPH-CPP (Pharmacist) on 06/25/21 at 1407  Med List Status: <None>  ? ?Medication Order Taking? Sig Documenting Provider Last Dose Status Informant  ?amLODipine (NORVASC) 10 MG tablet [id number] Yes Take 1 tablet (10 mg total) by mouth daily. Claiborne Rigg, NP Taking Active   ?atorvastatin (LIPITOR) 20 MG tablet [id number] Yes TAKE 1 TABLET (20 MG TOTAL) BY MOUTH DAILY. Claiborne Rigg, NP Taking Active   ?benzonatate (TESSALON) 100 MG capsule [id number] No Take 1 capsule (100 mg total) by mouth every 8 (eight) hours.  ?Patient not taking: Reported on 06/25/2021  ? Claiborne Rigg, NP Not Taking Active   ?carvedilol (COREG) 12.5 MG tablet [id number]  Yes Take 1 tablet (12.5 mg total) by mouth 2 (two) times daily with a meal. Hoy Register, MD Taking Active   ?cetirizine (ZYRTEC ALLERGY) 10 MG tablet [id number] Yes Take 1 tablet (10 mg total) by mouth daily. [place], [place], PA-C Taking Active   ?cyclobenzaprine (FLEXERIL) 10 MG tablet [id number] Yes TAKE 1 TABLET (10 MG TOTAL) BY MOUTH 3 (THREE) TIMES DAILY AS NEEDED FOR MUSCLE SPASMS. Hoy Register, MD Taking Active   ?megestrol (MEGACE) 40 MG tablet [id number] Yes TAKE 0.5 TABLETS (20 MG TOTAL) BY MOUTH DAILY. Levie Heritage, DO Taking Active   ?traZODone (DESYREL) 50 MG tablet [id number] Yes TAKE 1 TABLET (50 MG TOTAL) BY MOUTH AT BEDTIME AS NEEDED FOR SLEEP. Claiborne Rigg, NP Taking Active   ?valsartan (DIOVAN) 160 MG tablet [id number] Yes Take 1 tablet (160 mg total) by mouth daily. Claiborne Rigg, NP Taking Active   ? ?  ?  ? ?  ? ? ? ?Assessment/Plan: ?- Currently uncontrolled but improving ?- Reviewed goal blood pressure <130/80 ?- Reviewed appropriate administration of medication regimen ?- Counseled on long term microvascular and macrovascular complications of uncontrolled hypertension ?- Reviewed appropriate home BP monitoring technique (avoid caffeine, smoking, and exercise for 30 minutes before checking, rest for at least 5 minutes before taking BP, sit with feet flat on the floor and back against a hard surface, uncross legs, and rest arm on flat surface). Discussed that arm cuffs are preferred. Encouraged to bring wrist cuff to upcoming appointment for comparison ?- Reviewed to check blood pressure daily, document, and provide at next provider  visit ?- Discussed dietary modifications, such as reduced salt intake, focus on whole grains, vegetables, lean proteins ?- Discussed goal of 150 minutes of moderate intensity physical activity weekly ?- Recommend to decrease amlodipine and increase valsartan at upcoming appointment with embedded pharmacist.  ? ?Catie Eppie Gibson, PharmD,  BCACP ?[facility] ?[phone] ? ?

## 2021-06-25 NOTE — Patient Instructions (Signed)
Meagan Nelson,  ? ?It was great talking today! Keep up the great work with taking your medications, focusing on a healthy diet, and staying physically active. ? ?Check your blood pressure daily . Our goal is less than 130/80 ? ?We recommend a blood pressure cuff that goes around your upper arm, as these are generally the most accurate. You can find a good quality one (like [employer]) at our [facility] for ~ $28-30, or at [employer]/[facility] for $33-35 ? ?To appropriately check your blood pressure, make sure you do the following:  ?1) Avoid caffeine, exercise, or tobacco products for 30 minutes before checking. ?2) Sit with your back supported in a flat-backed chair. Rest your arm on something flat (arm of the chair, table, etc). ?3) Sit still with your feet flat on the floor, resting, for at least 5 minutes.  ?4) Check your blood pressure. Take 1-2 readings.  ? ?Write down these readings and bring with you to any provider appointments. Bring your home blood pressure machine with you to a provider's office for accuracy comparison at least once a year. ? ?Talk to [facility] about the swelling that you attribute to amlodipine.  ? ?Take care! ? ?Catie Eppie Gibson, PharmD, BCACP ?[facility] ?[phone] ? ?

## 2021-06-27 ENCOUNTER — Encounter: Payer: Self-pay | Admitting: Pharmacist

## 2021-06-27 ENCOUNTER — Ambulatory Visit: Payer: Self-pay | Attending: Nurse Practitioner | Admitting: Pharmacist

## 2021-06-27 ENCOUNTER — Other Ambulatory Visit: Payer: Self-pay | Admitting: Pharmacist

## 2021-06-27 VITALS — BP 133/85

## 2021-06-27 DIAGNOSIS — F17210 Nicotine dependence, cigarettes, uncomplicated: Secondary | ICD-10-CM | POA: Insufficient documentation

## 2021-06-27 DIAGNOSIS — Z79899 Other long term (current) drug therapy: Secondary | ICD-10-CM | POA: Insufficient documentation

## 2021-06-27 DIAGNOSIS — I10 Essential (primary) hypertension: Secondary | ICD-10-CM

## 2021-06-27 NOTE — Progress Notes (Signed)
? ?S:    ?PCP: [name]  ? ?No chief complaint on file. ? ?Meagan Nelson is a 46 y.o. female who presents for hypertension evaluation, education, and management. PMH is significant for HTN and obesity. Patient was referred and last seen by Primary Care Provider, Bertram Denver, on 05/08/21. Patient was referred on that visit and her BP on that visit was 177/130 mmHg. ? ?Today, patient arrives in good spirits, but is nervous. She presents without assistance. Patient denies dizziness, headache, blurred vision, swelling. She endorses feeling anxious every time she visits the doctor's office, especially if she knows they're taking her BP that day. Patient takes BP in the morning before work (0530) and takes it 10 minutes after taking morning doses of antihypertensives and smoking 1 cigarette. Home BP readings are as follows: 131/79, 151/85, 139/89, 133/90 (taken today before office visit). ? ?Patient reports hypertension was diagnosed in 02/27/2017. ? ?Family History: ?- Mother: HTN ? ?Social History: Smokes 1 cigarette every day (0.05 packs/day) in the morning before work (0500). ? ?Medication adherence is optimal. Patient has taken morning BP medications today.  ? ?Current antihypertensives include: valsartan 160 mg, amlodipine 10 mg, carvedilol 12.5 mg BID ? ?Antihypertensives tried in the past include: losartan, clonidine, HCTZ ? ?Patient reported dietary habits: Patient has made dietary changes. She no longer uses salt, choosing salt-free garlic and onion powder to season food. She also increased her intake of fresh and frozen vegetables and rinses off canned vegetables when she does get them. Patient does not habitually consume alcohol. ? ?Patient-reported exercise habits: Walks daughter to track practice for 30 minutes 3 days/week. ? ?ASCVD risk factors include: HTN, obesity, smoking ? ?O:  ?Vitals:  ? 06/27/21 0921  ?BP: 133/85  ? ?Last 3 Office BP readings: ?BP Readings from Last 3 Encounters:  ?06/27/21 133/85   ?05/08/21 (!) 177/130  ?11/27/20 (!) 175/123  ? ?BMET ?   ?Component Value Date/Time  ? NA 140 05/08/2021 1108  ? K 3.3 (L) 05/08/2021 1108  ? CL 102 05/08/2021 1108  ? CO2 25 05/08/2021 1108  ? GLUCOSE 87 05/08/2021 1108  ? GLUCOSE 98 03/11/2017 1342  ? BUN 11 05/08/2021 1108  ? CREATININE 0.67 05/08/2021 1108  ? CALCIUM 9.0 05/08/2021 1108  ? GFRNONAA 112 03/27/2020 1155  ? GFRAA 129 03/27/2020 1155  ? ?Renal function: ?CrCl cannot be calculated (Patient's most recent lab result is older than the maximum 21 days allowed.). ? ?Clinical ASCVD: No  ?The 10-year ASCVD risk score (Arnett DK, et al., 2019) is: 7% ?  Values used to calculate the score: ?    Age: 69 years ?    Sex: Female ?    Is Non-Hispanic African American: Yes ?    Diabetic: No ?    Tobacco smoker: Yes ?    Systolic Blood Pressure: 133 mmHg ?    Is BP treated: Yes ?    HDL Cholesterol: 40 mg/dL ?    Total Cholesterol: 131 mg/dL ? ?A/P: ?Hypertension is currently controlled on current medications. BP goal < 130/80 mmHg. Medication adherence appears optimal. ?-No medication changes were done today. ?-The patient was counseled to to bring a log of BP readings taken later in her day (a few hours after taking antihypertensives and cigarette) for 1 month f/u assessment. ?-Counseled on lifestyle modifications for blood pressure control including reduced dietary sodium, increased exercise, adequate sleep. ? ?Results reviewed and written information provided. Patient verbalized understanding of treatment plan. Total time in face-to-face counseling  30 minutes.  ? ?F/u clinic visit in 1 month. Patient seen with Dr. Pete Pelt. ? ?Morton Stall, PharmD Candidate  ?[facility] ?Class of 2025 ?

## 2021-07-01 ENCOUNTER — Other Ambulatory Visit: Payer: Self-pay | Admitting: Family Medicine

## 2021-07-02 ENCOUNTER — Other Ambulatory Visit: Payer: Self-pay

## 2021-07-03 ENCOUNTER — Other Ambulatory Visit: Payer: Self-pay

## 2021-07-04 ENCOUNTER — Other Ambulatory Visit: Payer: Self-pay

## 2021-07-04 MED ORDER — MEGESTROL ACETATE 40 MG PO TABS
ORAL_TABLET | ORAL | 3 refills | Status: DC
  Filled 2021-07-04: qty 15, 30d supply, fill #0
  Filled 2021-08-06: qty 15, 30d supply, fill #1
  Filled 2021-09-05: qty 15, 30d supply, fill #2
  Filled 2021-10-08: qty 15, 30d supply, fill #3
  Filled 2021-11-12: qty 15, 30d supply, fill #4
  Filled 2021-12-12: qty 15, 30d supply, fill #5
  Filled 2022-01-23: qty 15, 30d supply, fill #6
  Filled 2022-03-12: qty 15, 30d supply, fill #7

## 2021-07-05 ENCOUNTER — Other Ambulatory Visit: Payer: Self-pay

## 2021-07-12 ENCOUNTER — Other Ambulatory Visit: Payer: Self-pay

## 2021-07-12 ENCOUNTER — Other Ambulatory Visit: Payer: Self-pay | Admitting: Family Medicine

## 2021-07-12 DIAGNOSIS — I10 Essential (primary) hypertension: Secondary | ICD-10-CM

## 2021-07-12 MED ORDER — CARVEDILOL 12.5 MG PO TABS
12.5000 mg | ORAL_TABLET | Freq: Two times a day (BID) | ORAL | 2 refills | Status: DC
  Filled 2021-07-12: qty 60, 30d supply, fill #0
  Filled 2021-08-27: qty 60, 30d supply, fill #1
  Filled 2021-10-08: qty 60, 30d supply, fill #2

## 2021-07-15 ENCOUNTER — Other Ambulatory Visit: Payer: Self-pay

## 2021-07-26 ENCOUNTER — Telehealth: Payer: Self-pay | Admitting: Nurse Practitioner

## 2021-07-26 NOTE — Telephone Encounter (Signed)
Pt is calling reschedule her 07/30/21 appt with Franky Macho. Please advise [phone] ?

## 2021-07-30 ENCOUNTER — Ambulatory Visit: Payer: Medicaid Other | Admitting: Pharmacist

## 2021-08-06 ENCOUNTER — Other Ambulatory Visit: Payer: Self-pay

## 2021-08-07 ENCOUNTER — Other Ambulatory Visit: Payer: Self-pay

## 2021-08-13 ENCOUNTER — Telehealth: Payer: Self-pay | Admitting: Nurse Practitioner

## 2021-08-13 ENCOUNTER — Other Ambulatory Visit: Payer: Self-pay

## 2021-08-13 NOTE — Telephone Encounter (Signed)
Pt wants to inform you meds are working, Bp has been 116/68, hasnt exceeded 136 systolic  and 80 diastolic.

## 2021-08-13 NOTE — Telephone Encounter (Signed)
Wonderful, thanks for letting me know. 

## 2021-08-23 ENCOUNTER — Other Ambulatory Visit: Payer: Self-pay

## 2021-08-27 ENCOUNTER — Other Ambulatory Visit: Payer: Self-pay

## 2021-08-29 ENCOUNTER — Other Ambulatory Visit: Payer: Self-pay

## 2021-09-05 ENCOUNTER — Other Ambulatory Visit: Payer: Self-pay

## 2021-09-06 ENCOUNTER — Other Ambulatory Visit: Payer: Self-pay

## 2021-09-11 ENCOUNTER — Other Ambulatory Visit: Payer: Self-pay

## 2021-09-19 ENCOUNTER — Telehealth: Payer: Self-pay | Admitting: Emergency Medicine

## 2021-09-19 ENCOUNTER — Ambulatory Visit: Payer: Medicaid Other | Attending: Nurse Practitioner | Admitting: Pharmacist

## 2021-09-19 VITALS — BP 140/90

## 2021-09-19 DIAGNOSIS — Z7901 Long term (current) use of anticoagulants: Secondary | ICD-10-CM | POA: Insufficient documentation

## 2021-09-19 DIAGNOSIS — I10 Essential (primary) hypertension: Secondary | ICD-10-CM | POA: Insufficient documentation

## 2021-09-19 DIAGNOSIS — Z79899 Other long term (current) drug therapy: Secondary | ICD-10-CM | POA: Insufficient documentation

## 2021-09-19 NOTE — Progress Notes (Signed)
   S:    PCP: [name]  Patient arrives well and in good spirits.  Presents to the clinic for hypertension evaluation, counseling, and management. Patient was referred and last seen by PCP on 05/08/2021. I saw her on 06/27/2021 and her BP looked great!  She denies HA, blurred vision, dyspnea, or chest pain.   Medication adherence: reported. She missed carvedilol last night but has taken this morning's dose as well as valsartan and amlodipine.   Current BP Medications include:  Amlodipine 10 mg daily, carvedilol 12.5 mg BID, valsartan 160 mg daily  Dietary habits include: drank 1 soda today at work, reports compliance with salt restriction Exercise habits include: none reported   O:  Vitals:   09/19/21 0855  BP: 140/90    Home BP readings:  - Reports that these have been good.  - 110s/70s  Last 3 Office BP readings: BP Readings from Last 3 Encounters:  09/19/21 140/90  06/27/21 133/85  05/08/21 (!) 177/130   BMET    Component Value Date/Time   NA 140 05/08/2021 1108   K 3.3 (L) 05/08/2021 1108   CL 102 05/08/2021 1108   CO2 25 05/08/2021 1108   GLUCOSE 87 05/08/2021 1108   GLUCOSE 98 03/11/2017 1342   BUN 11 05/08/2021 1108   CREATININE 0.67 05/08/2021 1108   CALCIUM 9.0 05/08/2021 1108   GFRNONAA 112 03/27/2020 1155   GFRAA 129 03/27/2020 1155   Renal function: CrCl cannot be calculated (Patient's most recent lab result is older than the maximum 21 days allowed.).  Clinical ASCVD: No  The 10-year ASCVD risk score (Arnett DK, et al., 2019) is: 8.7%   Values used to calculate the score:     Age: 46 years     Sex: Female     Is Non-Hispanic African American: Yes     Diabetic: No     Tobacco smoker: Yes     Systolic Blood Pressure: 140 mmHg     Is BP treated: Yes     HDL Cholesterol: 40 mg/dL     Total Cholesterol: 131 mg/dL  A/P: Hypertension longstanding currently close to goal on current medications. BP Goal = < 130/80 mmHg. Medication adherence appropriate.  Reinforced that her SBP should be <130.  -Continue current regimen.  -Counseled on lifestyle modifications for blood pressure control including reduced dietary sodium, increased exercise, adequate sleep. -Labs per PCP  Results reviewed and written information provided. Total time in face-to-face counseling 15 minutes.   F/U Clinic Visit w/ PCP.   Butch Penny, PharmD, [place]y [place], CPP Clinical Pharmacist [facility] [phone]

## 2021-09-19 NOTE — Telephone Encounter (Signed)
Patient seen today for BP check. Inquiring need for K supplementation. Told her we will check a level today and then inform her based on her result.

## 2021-09-19 NOTE — Telephone Encounter (Signed)
Telephone encounter routed to PCP and [facility]

## 2021-09-20 ENCOUNTER — Other Ambulatory Visit: Payer: Self-pay | Admitting: Nurse Practitioner

## 2021-09-20 ENCOUNTER — Other Ambulatory Visit: Payer: Self-pay

## 2021-09-20 LAB — BASIC METABOLIC PANEL
BUN/Creatinine Ratio: 17 (ref 9–23)
BUN: 11 mg/dL (ref 6–24)
CO2: 23 mmol/L (ref 20–29)
Calcium: 9.2 mg/dL (ref 8.7–10.2)
Chloride: 106 mmol/L (ref 96–106)
Creatinine, Ser: 0.65 mg/dL (ref 0.57–1.00)
Glucose: 101 mg/dL — ABNORMAL HIGH (ref 70–99)
Potassium: 3.4 mmol/L — ABNORMAL LOW (ref 3.5–5.2)
Sodium: 144 mmol/L (ref 134–144)
eGFR: 110 mL/min/{1.73_m2} (ref >59)

## 2021-09-20 MED ORDER — POTASSIUM CHLORIDE CRYS ER 20 MEQ PO TBCR
20.0000 meq | EXTENDED_RELEASE_TABLET | Freq: Every day | ORAL | 3 refills | Status: DC
  Filled 2021-09-20: qty 30, 30d supply, fill #0
  Filled 2021-10-23 – 2021-11-12 (×2): qty 30, 30d supply, fill #1
  Filled 2021-12-12: qty 30, 30d supply, fill #2
  Filled 2022-02-12: qty 30, 30d supply, fill #3

## 2021-09-24 ENCOUNTER — Other Ambulatory Visit: Payer: Self-pay

## 2021-09-27 ENCOUNTER — Other Ambulatory Visit: Payer: Self-pay

## 2021-09-30 ENCOUNTER — Other Ambulatory Visit: Payer: Self-pay

## 2021-10-08 ENCOUNTER — Other Ambulatory Visit: Payer: Self-pay | Admitting: Nurse Practitioner

## 2021-10-08 ENCOUNTER — Other Ambulatory Visit: Payer: Self-pay

## 2021-10-08 DIAGNOSIS — E785 Hyperlipidemia, unspecified: Secondary | ICD-10-CM

## 2021-10-08 MED ORDER — ATORVASTATIN CALCIUM 20 MG PO TABS
ORAL_TABLET | Freq: Every day | ORAL | 0 refills | Status: DC
  Filled 2021-10-08: qty 90, 90d supply, fill #0

## 2021-10-09 ENCOUNTER — Other Ambulatory Visit: Payer: Self-pay

## 2021-10-10 ENCOUNTER — Other Ambulatory Visit: Payer: Self-pay

## 2021-10-23 ENCOUNTER — Other Ambulatory Visit: Payer: Self-pay

## 2021-10-29 ENCOUNTER — Other Ambulatory Visit: Payer: Self-pay

## 2021-11-12 ENCOUNTER — Other Ambulatory Visit: Payer: Self-pay

## 2021-11-12 ENCOUNTER — Other Ambulatory Visit: Payer: Self-pay | Admitting: Family Medicine

## 2021-11-12 DIAGNOSIS — I10 Essential (primary) hypertension: Secondary | ICD-10-CM

## 2021-11-13 ENCOUNTER — Other Ambulatory Visit: Payer: Self-pay

## 2021-11-13 MED ORDER — CARVEDILOL 12.5 MG PO TABS
12.5000 mg | ORAL_TABLET | Freq: Two times a day (BID) | ORAL | 2 refills | Status: DC
  Filled 2021-11-13: qty 60, 30d supply, fill #0
  Filled 2022-01-10: qty 60, 30d supply, fill #1
  Filled 2022-03-12: qty 60, 30d supply, fill #2

## 2021-11-13 NOTE — Telephone Encounter (Signed)
Requested medications are due for refill today.  unsure  Requested medications are on the active medications list.  yes  Last refill. 07/12/2021 #60 2 refills  Future visit scheduled.   no  Notes to clinic.  Rx written to expire 11/09/2021 - Rx is expired.    Requested Prescriptions  Pending Prescriptions Disp Refills   carvedilol (COREG) 12.5 MG tablet 60 tablet 2    Sig: Take 1 tablet (12.5 mg total) by mouth 2 (two) times daily with a meal.     Cardiovascular: Beta Blockers 3 Failed - 11/12/2021  2:15 PM      Failed - Last BP in normal range    BP Readings from Last 1 Encounters:  09/19/21 140/90         Passed - Cr in normal range and within 360 days    Creatinine, Ser  Date Value Ref Range Status  09/19/2021 0.65 0.57 - 1.00 mg/dL Final   Creatinine, Urine  Date Value Ref Range Status  03/12/2009 52.9 mg/dL Final         Passed - AST in normal range and within 360 days    AST  Date Value Ref Range Status  05/08/2021 19 0 - 40 IU/L Final         Passed - ALT in normal range and within 360 days    ALT  Date Value Ref Range Status  05/08/2021 17 0 - 32 IU/L Final         Passed - Last Heart Rate in normal range    Pulse Readings from Last 1 Encounters:  05/08/21 84         Passed - Valid encounter within last 6 months    Recent Outpatient Visits           1 month ago Essential hypertension   [facility] [place], Cornelius Moras, RPH-CPP   4 months ago Essential hypertension   [facility] [place], Cornelius Moras, RPH-CPP   6 months ago Essential hypertension   [facility] [place], Shea Stakes, NP   1 year ago Dyslipidemia, goal LDL below 100   [facility] [place], Cornelius Moras, RPH-CPP   1 year ago Leukocytosis, unspecified type   [facility] [place], Shea Stakes, NP

## 2021-11-14 ENCOUNTER — Other Ambulatory Visit: Payer: Self-pay

## 2021-12-12 ENCOUNTER — Other Ambulatory Visit: Payer: Self-pay | Admitting: Nurse Practitioner

## 2021-12-12 ENCOUNTER — Other Ambulatory Visit: Payer: Self-pay

## 2021-12-12 DIAGNOSIS — I10 Essential (primary) hypertension: Secondary | ICD-10-CM

## 2021-12-12 MED ORDER — AMLODIPINE BESYLATE 10 MG PO TABS
10.0000 mg | ORAL_TABLET | Freq: Every day | ORAL | 0 refills | Status: DC
  Filled 2021-12-12: qty 30, 30d supply, fill #0
  Filled 2022-01-10: qty 30, 30d supply, fill #1
  Filled 2022-02-12: qty 30, 30d supply, fill #2

## 2021-12-17 ENCOUNTER — Other Ambulatory Visit: Payer: Self-pay

## 2022-01-10 ENCOUNTER — Other Ambulatory Visit: Payer: Self-pay

## 2022-01-10 ENCOUNTER — Other Ambulatory Visit: Payer: Self-pay | Admitting: Family Medicine

## 2022-01-10 DIAGNOSIS — E785 Hyperlipidemia, unspecified: Secondary | ICD-10-CM

## 2022-01-10 MED ORDER — ATORVASTATIN CALCIUM 20 MG PO TABS
20.0000 mg | ORAL_TABLET | Freq: Every day | ORAL | 0 refills | Status: DC
  Filled 2022-01-10: qty 30, 30d supply, fill #0

## 2022-01-13 ENCOUNTER — Other Ambulatory Visit: Payer: Self-pay

## 2022-01-23 ENCOUNTER — Other Ambulatory Visit: Payer: Self-pay

## 2022-02-12 ENCOUNTER — Other Ambulatory Visit: Payer: Self-pay

## 2022-02-12 ENCOUNTER — Other Ambulatory Visit: Payer: Self-pay | Admitting: Family Medicine

## 2022-02-12 DIAGNOSIS — E785 Hyperlipidemia, unspecified: Secondary | ICD-10-CM

## 2022-02-13 ENCOUNTER — Other Ambulatory Visit: Payer: Self-pay

## 2022-03-03 ENCOUNTER — Other Ambulatory Visit: Payer: Self-pay

## 2022-03-06 ENCOUNTER — Other Ambulatory Visit: Payer: Self-pay

## 2022-03-12 ENCOUNTER — Other Ambulatory Visit: Payer: Self-pay | Admitting: Family Medicine

## 2022-03-12 ENCOUNTER — Other Ambulatory Visit: Payer: Self-pay

## 2022-03-12 DIAGNOSIS — I10 Essential (primary) hypertension: Secondary | ICD-10-CM

## 2022-03-12 MED ORDER — AMLODIPINE BESYLATE 10 MG PO TABS
10.0000 mg | ORAL_TABLET | Freq: Every day | ORAL | 0 refills | Status: DC
  Filled 2022-03-12: qty 30, 30d supply, fill #0

## 2022-03-13 ENCOUNTER — Other Ambulatory Visit: Payer: Self-pay | Admitting: Nurse Practitioner

## 2022-03-13 DIAGNOSIS — I10 Essential (primary) hypertension: Secondary | ICD-10-CM

## 2022-03-13 NOTE — Telephone Encounter (Signed)
Medication Refill - Medication: valsartan (DIOVAN) 160 MG table   Has the patient contacted their pharmacy? Yes.    No, more refills.   (Agent: If yes, when and what did the pharmacy advise?)  Preferred Pharmacy (with phone number or street name):  [facility]  [address], [address] [place] [address]  Phone: [phone] Fax: [phone]  Hours: M-F 7:30a-6:00p   Has the patient been seen for an appointment in the last year OR does the patient have an upcoming appointment? Yes.    Agent: Please be advised that RX refills may take up to 3 business days. We ask that you follow-up with your pharmacy.

## 2022-03-14 ENCOUNTER — Other Ambulatory Visit: Payer: Self-pay

## 2022-03-14 MED ORDER — VALSARTAN 160 MG PO TABS
160.0000 mg | ORAL_TABLET | Freq: Every day | ORAL | 0 refills | Status: DC
  Filled 2022-03-14: qty 90, 90d supply, fill #0
  Filled 2022-04-15: qty 30, 30d supply, fill #0
  Filled 2022-05-19: qty 30, 30d supply, fill #1

## 2022-03-14 NOTE — Telephone Encounter (Signed)
....................   Requested Prescriptions  Pending Prescriptions Disp Refills   valsartan (DIOVAN) 160 MG tablet 90 tablet 0    Sig: Take 1 tablet (160 mg total) by mouth daily.     Cardiovascular:  Angiotensin Receptor Blockers Failed - 03/13/2022  1:02 PM      Failed - K in normal range and within 180 days    Potassium  Date Value Ref Range Status  09/19/2021 3.4 (L) 3.5 - 5.2 mmol/L Final         Failed - Last BP in normal range    BP Readings from Last 1 Encounters:  09/19/21 140/90         Passed - Cr in normal range and within 180 days    Creatinine, Ser  Date Value Ref Range Status  09/19/2021 0.65 0.57 - 1.00 mg/dL Final   Creatinine, Urine  Date Value Ref Range Status  03/12/2009 52.9 mg/dL Final         Passed - Patient is not pregnant      Passed - Valid encounter within last 6 months    Recent Outpatient Visits           5 months ago Essential hypertension   [facility] [place], Cornelius Moras, RPH-CPP   8 months ago Essential hypertension   [facility] [place], Cornelius Moras, RPH-CPP   10 months ago Essential hypertension   [facility] [place], Shea Stakes, NP   1 year ago Dyslipidemia, goal LDL below 100   [facility] [place], Cornelius Moras, RPH-CPP   1 year ago Leukocytosis, unspecified type   [facility] [place], Shea Stakes, NP       Future Appointments             In 1 month Claiborne Rigg, NP [facility]

## 2022-03-15 ENCOUNTER — Other Ambulatory Visit: Payer: Self-pay

## 2022-03-18 ENCOUNTER — Other Ambulatory Visit: Payer: Self-pay

## 2022-04-15 ENCOUNTER — Other Ambulatory Visit: Payer: Self-pay | Admitting: Family Medicine

## 2022-04-15 ENCOUNTER — Other Ambulatory Visit: Payer: Self-pay

## 2022-04-15 DIAGNOSIS — I10 Essential (primary) hypertension: Secondary | ICD-10-CM

## 2022-04-16 ENCOUNTER — Other Ambulatory Visit: Payer: Self-pay

## 2022-04-16 MED ORDER — AMLODIPINE BESYLATE 10 MG PO TABS
10.0000 mg | ORAL_TABLET | Freq: Every day | ORAL | 0 refills | Status: DC
  Filled 2022-04-16: qty 15, 15d supply, fill #0

## 2022-04-16 NOTE — Telephone Encounter (Signed)
Courtesy RF given- patient has appointment- #15 extension Requested Prescriptions  Pending Prescriptions Disp Refills   amLODipine (NORVASC) 10 MG tablet 15 tablet 0    Sig: Take 1 tablet (10 mg total) by mouth daily. Please make appointment with PCP for more refills.     Cardiovascular: Calcium Channel Blockers 2 Failed - 04/15/2022  4:21 PM      Failed - Last BP in normal range    BP Readings from Last 1 Encounters:  09/19/21 140/90         Failed - Valid encounter within last 6 months    Recent Outpatient Visits           6 months ago Essential hypertension   [place], Jarome Matin, RPH-CPP   9 months ago Essential hypertension   [place], RPH-CPP   11 months ago Essential hypertension   [place], Vernia Buff, NP   1 year ago Dyslipidemia, goal LDL below , RPH-CPP   1 year ago Leukocytosis, unspecified type   [place] Gildardo Pounds, NP       Future Appointments             In 2 weeks Gildardo Pounds, NP [employer] in normal range    Pulse Readings from Last 1 Encounters:  05/08/21 84

## 2022-04-30 ENCOUNTER — Ambulatory Visit: Payer: Medicaid Other | Admitting: Nurse Practitioner

## 2022-05-19 ENCOUNTER — Other Ambulatory Visit: Payer: Self-pay

## 2022-05-19 ENCOUNTER — Other Ambulatory Visit: Payer: Self-pay | Admitting: Family Medicine

## 2022-05-19 DIAGNOSIS — I10 Essential (primary) hypertension: Secondary | ICD-10-CM

## 2022-05-20 ENCOUNTER — Other Ambulatory Visit: Payer: Self-pay

## 2022-05-20 MED ORDER — CARVEDILOL 12.5 MG PO TABS
12.5000 mg | ORAL_TABLET | Freq: Two times a day (BID) | ORAL | 0 refills | Status: DC
  Filled 2022-05-20: qty 42, 21d supply, fill #0

## 2022-05-20 MED ORDER — AMLODIPINE BESYLATE 10 MG PO TABS
10.0000 mg | ORAL_TABLET | Freq: Every day | ORAL | 0 refills | Status: DC
  Filled 2022-05-20: qty 21, 21d supply, fill #0

## 2022-05-20 NOTE — Telephone Encounter (Signed)
Requested Prescriptions  Pending Prescriptions Disp Refills   carvedilol (COREG) 12.5 MG tablet 42 tablet 0    Sig: Take 1 tablet (12.5 mg total) by mouth 2 times daily with a meal.     Cardiovascular: Beta Blockers 3 Failed - 05/19/2022  8:49 AM      Failed - AST in normal range and within 360 days    AST  Date Value Ref Range Status  05/08/2021 19 0 - 40 IU/L Final         Failed - ALT in normal range and within 360 days    ALT  Date Value Ref Range Status  05/08/2021 17 0 - 32 IU/L Final         Failed - Last BP in normal range    BP Readings from Last 1 Encounters:  09/19/21 140/90         Failed - Valid encounter within last 6 months    Recent Outpatient Visits           8 months ago Essential hypertension   [place], Jarome Matin, RPH-CPP   10 months ago Essential hypertension   [place], Jarome Matin, RPH-CPP   1 year ago Essential hypertension   [place], [place] W, NP   1 year ago Dyslipidemia, goal LDL below [place], RPH-CPP   1 year ago Leukocytosis, unspecified type   [place], Vernia Buff, NP       Future Appointments             In 3 weeks Gildardo Pounds, NP [place] in normal range and within 360 days    Creatinine, Ser  Date Value Ref Range Status  09/19/2021 0.65 0.57 - 1.00 mg/dL Final   Creatinine, Urine  Date Value Ref Range Status  03/12/2009 52.9 mg/dL Final         Passed - Last Heart Rate in normal range    Pulse Readings from Last 1 Encounters:  05/08/21 84          amLODipine (NORVASC) 10 MG tablet 21 tablet 0    Sig: Take 1 tablet (10 mg total) by mouth daily. Please make appointment with PCP for more refills.      Cardiovascular: Calcium Channel Blockers 2 Failed - 05/19/2022  8:49 AM      Failed - Last BP in normal range    BP Readings from Last 1 Encounters:  09/19/21 140/90         Failed - Valid encounter within last 6 months    Recent Outpatient Visits           8 months ago Essential hypertension   [place], Jarome Matin, RPH-CPP   10 months ago Essential hypertension   [place], RPH-CPP   1 year ago Essential hypertension   [place], Vernia Buff, NP   1 year ago Dyslipidemia, goal LDL below [place], RPH-CPP   1 year ago Leukocytosis, unspecified  type   [facility] Gildardo Pounds, NP       Future Appointments             In 3 weeks Gildardo Pounds, NP [place]            Passed - Last Heart Rate in normal range    Pulse Readings from Last 1 Encounters:  05/08/21 84

## 2022-05-21 ENCOUNTER — Other Ambulatory Visit: Payer: Self-pay

## 2022-06-10 ENCOUNTER — Ambulatory Visit: Payer: Self-pay | Attending: Nurse Practitioner | Admitting: Nurse Practitioner

## 2022-06-10 ENCOUNTER — Encounter: Payer: Self-pay | Admitting: Nurse Practitioner

## 2022-06-10 ENCOUNTER — Other Ambulatory Visit: Payer: Self-pay

## 2022-06-10 VITALS — BP 158/98 | HR 89 | Ht 66.0 in | Wt 272.4 lb

## 2022-06-10 DIAGNOSIS — M545 Low back pain, unspecified: Secondary | ICD-10-CM

## 2022-06-10 DIAGNOSIS — D72829 Elevated white blood cell count, unspecified: Secondary | ICD-10-CM

## 2022-06-10 DIAGNOSIS — Z79899 Other long term (current) drug therapy: Secondary | ICD-10-CM | POA: Insufficient documentation

## 2022-06-10 DIAGNOSIS — E876 Hypokalemia: Secondary | ICD-10-CM

## 2022-06-10 DIAGNOSIS — Z1231 Encounter for screening mammogram for malignant neoplasm of breast: Secondary | ICD-10-CM

## 2022-06-10 DIAGNOSIS — G4700 Insomnia, unspecified: Secondary | ICD-10-CM | POA: Insufficient documentation

## 2022-06-10 DIAGNOSIS — G8929 Other chronic pain: Secondary | ICD-10-CM

## 2022-06-10 DIAGNOSIS — I10 Essential (primary) hypertension: Secondary | ICD-10-CM

## 2022-06-10 DIAGNOSIS — Z1211 Encounter for screening for malignant neoplasm of colon: Secondary | ICD-10-CM

## 2022-06-10 DIAGNOSIS — F5101 Primary insomnia: Secondary | ICD-10-CM

## 2022-06-10 DIAGNOSIS — E785 Hyperlipidemia, unspecified: Secondary | ICD-10-CM

## 2022-06-10 MED ORDER — AMLODIPINE BESYLATE 10 MG PO TABS
10.0000 mg | ORAL_TABLET | Freq: Every day | ORAL | 1 refills | Status: DC
  Filled 2022-06-10: qty 90, 90d supply, fill #0
  Filled 2022-09-22: qty 90, 90d supply, fill #1

## 2022-06-10 MED ORDER — POTASSIUM CHLORIDE CRYS ER 20 MEQ PO TBCR
20.0000 meq | EXTENDED_RELEASE_TABLET | Freq: Every day | ORAL | 1 refills | Status: DC
  Filled 2022-06-10: qty 90, 90d supply, fill #0
  Filled 2023-02-24: qty 90, 90d supply, fill #1
  Filled 2023-03-06: qty 30, 30d supply, fill #1
  Filled 2023-05-06: qty 30, 30d supply, fill #2

## 2022-06-10 MED ORDER — TRAZODONE HCL 150 MG PO TABS
150.0000 mg | ORAL_TABLET | Freq: Every day | ORAL | 3 refills | Status: DC
  Filled 2022-06-10: qty 90, 90d supply, fill #0

## 2022-06-10 MED ORDER — CYCLOBENZAPRINE HCL 10 MG PO TABS
10.0000 mg | ORAL_TABLET | Freq: Three times a day (TID) | ORAL | 3 refills | Status: DC | PRN
  Filled 2022-06-10: qty 60, 20d supply, fill #0

## 2022-06-10 MED ORDER — CARVEDILOL 25 MG PO TABS
25.0000 mg | ORAL_TABLET | Freq: Two times a day (BID) | ORAL | 1 refills | Status: DC
  Filled 2022-06-10: qty 180, 90d supply, fill #0
  Filled 2022-12-19 (×2): qty 180, 90d supply, fill #1

## 2022-06-10 MED ORDER — VALSARTAN 160 MG PO TABS
160.0000 mg | ORAL_TABLET | Freq: Every day | ORAL | 1 refills | Status: DC
  Filled 2022-06-10: qty 90, 90d supply, fill #0
  Filled 2022-09-22: qty 90, 90d supply, fill #1

## 2022-06-10 MED ORDER — ATORVASTATIN CALCIUM 20 MG PO TABS
20.0000 mg | ORAL_TABLET | Freq: Every day | ORAL | 3 refills | Status: DC
  Filled 2022-06-10: qty 90, 90d supply, fill #0

## 2022-06-10 NOTE — Progress Notes (Signed)
Assessment & Plan:  Scottlynn was seen today for hypertension.  Diagnoses and all orders for this visit:  Primary hypertension Dose change-     carvedilol (COREG) 25 MG tablet; Take 1 tablet (25 mg total) by mouth 2 (two) times daily with a meal. Return in 4 weeks for blood pressure check -     amLODipine (NORVASC) 10 MG tablet; Take 1 tablet (10 mg total) by mouth daily. -     valsartan (DIOVAN) 160 MG tablet; Take 1 tablet (160 mg total) by mouth daily. -     CMP14+EGFR Continue all antihypertensives as prescribed.  Reminded to bring in blood pressure log for follow  up appointment.  RECOMMENDATIONS: DASH/Mediterranean Diets are healthier choices for HTN.    Primary insomnia -     traZODone (DESYREL) 150 MG tablet; Take 1 tablet (150 mg total) by mouth at bedtime.  Dyslipidemia, goal LDL below 100 -     atorvastatin (LIPITOR) 20 MG tablet; Take 1 tablet (20 mg total) by mouth daily. INSTRUCTIONS: Work on a low fat, heart healthy diet and participate in regular aerobic exercise program by working out at least 150 minutes per week; 5 days a week-30 minutes per day. Avoid red meat/beef/steak,  fried foods. junk foods, sodas, sugary drinks, unhealthy snacking, alcohol and smoking.  Drink at least 80 oz of water per day and monitor your carbohydrate intake daily.    Chronic right-sided low back pain without sciatica Well-controlled and only occurring infrequently -     cyclobenzaprine (FLEXERIL) 10 MG tablet; Take 1 tablet (10 mg total) by mouth 3 (three) times daily as needed for muscle spasms.  Colon cancer screening -     Fecal occult blood, imunochemical  Hypokalemia -     potassium chloride SA (KLOR-CON M) 20 MEQ tablet; Take 1 tablet (20 mEq total) by mouth daily.  Breast cancer screening by mammogram -     MS 3D SCR MAMMO BILAT BR (aka MM); Future  Leukocytosis, unspecified type -     CBC with Differential    Patient has been counseled on age-appropriate routine health  concerns for screening and prevention. These are reviewed and up-to-date. Referrals have been placed accordingly. Immunizations are up-to-date or declined.    Subjective:   Chief Complaint  Patient presents with   Hypertension   HPI Meagan Nelson 47 y.o. female presents to office today for follow up to HTN She is very tearful and states her son who is 28 year old was recently involved in a motor vehicle accident and is currently in ICU in [place]. He has been on a ventilator since February.  She is going back and forth between [place] and [place] almost every other weekend.  Blood pressure is elevated today and she reports similar readings at home.  Notes difficulty sleeping during the day as she is currently working a third shift job.   PMH: Hypertension, abnormal uterine bleeding, microcytic hypochromic anemia   HTN She is currently taking carvedilol 12.5 mg twice daily, amlodipine 10 mg daily and valsartan 160 mg daily.  At this time we will increase her carvedilol. BP Readings from Last 3 Encounters:  06/10/22 (!) 158/98  09/19/21 140/90  06/27/21 133/85     Review of Systems  Constitutional:  Negative for fever, malaise/fatigue and weight loss.  HENT: Negative.  Negative for nosebleeds.   Eyes: Negative.  Negative for blurred vision, double vision and photophobia.  Respiratory: Negative.  Negative for cough and shortness of  breath.   Cardiovascular: Negative.  Negative for chest pain, palpitations and leg swelling.  Gastrointestinal: Negative.  Negative for heartburn, nausea and vomiting.  Musculoskeletal: Negative.  Negative for myalgias.  Neurological: Negative.  Negative for dizziness, focal weakness, seizures and headaches.  Psychiatric/Behavioral:  Positive for depression. Negative for suicidal ideas. The patient has insomnia.     Past Medical History:  Diagnosis Date   Abscess    facial   Anemia    Headache    History of blood transfusion    Hypertension     PMH   Insomnia     Past Surgical History:  Procedure Laterality Date   CESAREAN SECTION     C/S x 1   DILATATION & CURETTAGE/HYSTEROSCOPY WITH MYOSURE N/A 03/11/2017   Procedure: DILATATION & CURETTAGE/HYSTEROSCOPY WITH MYOSURE;  Surgeon: Emily Filbert, MD;  Location: [facility];  Service: Gynecology;  Laterality: N/A;   DILATION AND CURETTAGE OF UTERUS     TONSILLECTOMY      Family History  Problem Relation Age of Onset   Hypertension Mother    Anemia Sister    Anemia Brother    Anemia Sister    Anemia Other    Hypertension Other     Social History Reviewed with no changes to be made today.   Outpatient Medications Prior to Visit  Medication Sig Dispense Refill   megestrol (MEGACE) 40 MG tablet TAKE 0.5 TABLETS (20 MG TOTAL) BY MOUTH DAILY. 45 tablet 3   amLODipine (NORVASC) 10 MG tablet Take 1 tablet (10 mg total) by mouth daily. Please make appointment with PCP for more refills. 21 tablet 0   carvedilol (COREG) 12.5 MG tablet Take 1 tablet (12.5 mg total) by mouth 2 times daily with a meal. 42 tablet 0   potassium chloride SA (KLOR-CON M) 20 MEQ tablet Take 1 tablet (20 mEq total) by mouth daily. 30 tablet 3   valsartan (DIOVAN) 160 MG tablet Take 1 tablet (160 mg total) by mouth daily. 90 tablet 0   cetirizine (ZYRTEC ALLERGY) 10 MG tablet Take 1 tablet (10 mg total) by mouth daily. (Patient not taking: Reported on 06/10/2022) 30 tablet 0   atorvastatin (LIPITOR) 20 MG tablet Take 1 tablet (20 mg total) by mouth daily.Needs appointment. (Patient not taking: Reported on 06/10/2022) 30 tablet 0   benzonatate (TESSALON) 100 MG capsule Take 1 capsule (100 mg total) by mouth every 8 (eight) hours. (Patient not taking: Reported on 06/25/2021) 30 capsule 0   cyclobenzaprine (FLEXERIL) 10 MG tablet TAKE 1 TABLET (10 MG TOTAL) BY MOUTH 3 (THREE) TIMES DAILY AS NEEDED FOR MUSCLE SPASMS. (Patient not taking: Reported on 06/10/2022) 60 tablet 1   traZODone (DESYREL) 50 MG  tablet TAKE 1 TABLET (50 MG TOTAL) BY MOUTH AT BEDTIME AS NEEDED FOR SLEEP. (Patient not taking: Reported on 06/10/2022) 30 tablet 1   No facility-administered medications prior to visit.    Allergies  Allergen Reactions   Naprosyn [Naproxen] Nausea And Vomiting   Amlodipine Swelling       Objective:    BP (!) 158/98   Pulse 89   Ht 5\' 6"  (1.676 m)   Wt 272 lb 6.4 oz (123.6 kg)   SpO2 100%   BMI 43.97 kg/m  Wt Readings from Last 3 Encounters:  06/10/22 272 lb 6.4 oz (123.6 kg)  05/08/21 264 lb 8 oz (120 kg)  11/27/20 258 lb 11.2 oz (117.3 kg)    Physical Exam Vitals and nursing note  reviewed.  Constitutional:      Appearance: She is well-developed.  HENT:     Head: Normocephalic and atraumatic.  Cardiovascular:     Rate and Rhythm: Normal rate and regular rhythm.     Heart sounds: Normal heart sounds. No murmur heard.    No friction rub. No gallop.  Pulmonary:     Effort: Pulmonary effort is normal. No tachypnea or respiratory distress.     Breath sounds: Normal breath sounds. No decreased breath sounds, wheezing, rhonchi or rales.  Chest:     Chest wall: No tenderness.  Abdominal:     General: Bowel sounds are normal.     Palpations: Abdomen is soft.  Musculoskeletal:        General: Normal range of motion.     Cervical back: Normal range of motion.  Skin:    General: Skin is warm and dry.  Neurological:     Mental Status: She is alert and oriented to person, place, and time.     Coordination: Coordination normal.  Psychiatric:        Mood and Affect: Mood is depressed. Affect is tearful.        Behavior: Behavior normal. Behavior is cooperative.        Thought Content: Thought content normal.        Cognition and Memory: Cognition normal.        Judgment: Judgment normal.          Patient has been counseled extensively about nutrition and exercise as well as the importance of adherence with medications and regular follow-up. The patient was given  clear instructions to go to ER or return to medical center if symptoms don't improve, worsen or new problems develop. The patient verbalized understanding.   Follow-up: Return in about 4 weeks (around 07/08/2022) for BP recheck.   Gildardo Pounds, FNP-BC [facility] [place], [place]   06/10/2022, 5:49 PM

## 2022-06-11 ENCOUNTER — Other Ambulatory Visit: Payer: Self-pay

## 2022-06-11 LAB — CMP14+EGFR
ALT: 17 IU/L (ref 0–32)
AST: 18 IU/L (ref 0–40)
Albumin/Globulin Ratio: 1.5 (ref 1.2–2.2)
Albumin: 4 g/dL (ref 3.9–4.9)
Alkaline Phosphatase: 97 IU/L (ref 44–121)
BUN/Creatinine Ratio: 48 — ABNORMAL HIGH (ref 9–23)
BUN: 16 mg/dL (ref 6–24)
Bilirubin Total: 0.3 mg/dL (ref 0.0–1.2)
CO2: 23 mmol/L (ref 20–29)
Calcium: 9.1 mg/dL (ref 8.7–10.2)
Chloride: 103 mmol/L (ref 96–106)
Creatinine, Ser: 0.33 mg/dL — ABNORMAL LOW (ref 0.57–1.00)
Globulin, Total: 2.6 g/dL (ref 1.5–4.5)
Glucose: 96 mg/dL (ref 70–99)
Potassium: 3.3 mmol/L — ABNORMAL LOW (ref 3.5–5.2)
Sodium: 141 mmol/L (ref 134–144)
Total Protein: 6.6 g/dL (ref 6.0–8.5)
eGFR: 129 mL/min/{1.73_m2} (ref >59)

## 2022-06-11 LAB — CBC WITH DIFFERENTIAL/PLATELET
Basophils Absolute: 0.1 10*3/uL (ref 0.0–0.2)
Basos: 1 %
EOS (ABSOLUTE): 0.3 10*3/uL (ref 0.0–0.4)
Eos: 2 %
Hematocrit: 48.9 % — ABNORMAL HIGH (ref 34.0–46.6)
Hemoglobin: 16.4 g/dL — ABNORMAL HIGH (ref 11.1–15.9)
Immature Grans (Abs): 0 10*3/uL (ref 0.0–0.1)
Immature Granulocytes: 0 %
Lymphocytes Absolute: 3 10*3/uL (ref 0.7–3.1)
Lymphs: 20 %
MCH: 30.2 pg (ref 26.6–33.0)
MCHC: 33.5 g/dL (ref 31.5–35.7)
MCV: 90 fL (ref 79–97)
Monocytes Absolute: 1 10*3/uL — ABNORMAL HIGH (ref 0.1–0.9)
Monocytes: 6 %
Neutrophils Absolute: 10.7 10*3/uL — ABNORMAL HIGH (ref 1.4–7.0)
Neutrophils: 71 %
Platelets: 236 10*3/uL (ref 150–450)
RBC: 5.43 x10E6/uL — ABNORMAL HIGH (ref 3.77–5.28)
RDW: 13.2 % (ref 11.7–15.4)
WBC: 15.2 10*3/uL — ABNORMAL HIGH (ref 3.4–10.8)

## 2022-06-11 NOTE — Progress Notes (Signed)
Patient identified by name and date of birth.  Appointment scheduled.  

## 2022-06-16 ENCOUNTER — Other Ambulatory Visit: Payer: Self-pay

## 2022-06-16 DIAGNOSIS — R7989 Other specified abnormal findings of blood chemistry: Secondary | ICD-10-CM

## 2022-06-16 DIAGNOSIS — I10 Essential (primary) hypertension: Secondary | ICD-10-CM

## 2022-06-17 ENCOUNTER — Telehealth: Payer: Self-pay

## 2022-06-17 NOTE — Telephone Encounter (Signed)
Telephoned patient at home number. Left a voice message with [facility] (scholarship) contact information. 

## 2022-06-18 ENCOUNTER — Other Ambulatory Visit: Payer: Self-pay | Admitting: Nurse Practitioner

## 2022-06-18 DIAGNOSIS — D72829 Elevated white blood cell count, unspecified: Secondary | ICD-10-CM

## 2022-06-18 DIAGNOSIS — E876 Hypokalemia: Secondary | ICD-10-CM

## 2022-06-25 NOTE — Telephone Encounter (Signed)
Telephoned patient at mobile number. Patient asked [employer] (scholarship) to call back at a later time (work).

## 2022-06-30 ENCOUNTER — Ambulatory Visit: Payer: Medicaid Other | Attending: Nurse Practitioner

## 2022-06-30 ENCOUNTER — Other Ambulatory Visit: Payer: Self-pay

## 2022-06-30 NOTE — Progress Notes (Signed)
Patient outreached by Clovis Pu, PharmD Candidate on 4/12 to discuss hypertension.   Patient has an automated home blood pressure machine.   Medication review was performed. They are taking medications as prescribed.   The following barriers to adherence were noted:  - They do not have cost concerns.  - They do not have transportation concerns.  - They do not need assistance obtaining refills.  - They do not occasionally forget to take some of their prescribed medications.  - They do not feel like one/some of their medications make them feel poorly.  - They do not have questions or concerns about their medications.  - They do have follow up scheduled with their primary care provider/cardiologist.   The following interventions were completed:  - Medications were reviewed  - Patient was educated on goal blood pressures and long term health implications of elevated blood pressure   The patient has follow up scheduled: 5/10  PCP: Penny Pia, NP    Clovis Pu, PharmD Candidate   Valeda Malm, Pharm.D. PGY-2 Ambulatory Care Pharmacy Resident

## 2022-07-08 ENCOUNTER — Other Ambulatory Visit: Payer: Self-pay | Admitting: Family Medicine

## 2022-07-14 ENCOUNTER — Other Ambulatory Visit: Payer: Self-pay

## 2022-07-22 ENCOUNTER — Other Ambulatory Visit: Payer: Self-pay

## 2022-07-22 MED ORDER — MEGESTROL ACETATE 40 MG PO TABS
20.0000 mg | ORAL_TABLET | Freq: Every day | ORAL | 3 refills | Status: AC
  Filled 2022-07-22: qty 45, 90d supply, fill #0

## 2022-07-23 ENCOUNTER — Other Ambulatory Visit: Payer: Self-pay

## 2022-07-24 ENCOUNTER — Telehealth: Payer: Self-pay

## 2022-07-24 NOTE — Progress Notes (Signed)
   Niamya Selimovic December 01, 1975 [id number]  Patient outreached by Haze Boyden, PharmD Candidate on 07/24/2022 while they were picking up prescriptions at [facility].  Blood Pressure Readings: Last documented ambulatory systolic blood pressure: 146 Last documented ambulatory diastolic blood pressure: 72 Does the patient have a validated home blood pressure machine?: Yes They report home readings   Medication review was performed. Is the patient taking their medications as prescribed?: Yes Differences from their prescribed list include:   The following barriers to adherence were noted: Does the patient have cost concerns?: No Does the patient have transportation concerns?: No Does the patient need assistance obtaining refills?: No Does the patient occassionally forget to take some of their prescribed medications?: No Does the patient feel like one/some of their medications make them feel poorly?: No Does the patient have questions or concerns about their medications?: No Does the patient have a follow up scheduled with their primary care provider/cardiologist?: Yes (07/25/22)   Interventions: Interventions Completed: Medications were reviewed, Patient was educated on goal blood pressures and long term health implications of elevated blood pressure, Patient was educated on proper technique to check home blood pressure and reminded to bring home machine and readings to next provider appointment, Patient was educated on medications, including indication and administration  The patient has follow up scheduled:  PCP: Claiborne Rigg, NP 07/25/2022   Haze Boyden, Student-PharmD

## 2022-07-25 ENCOUNTER — Ambulatory Visit: Payer: Medicaid Other | Admitting: Nurse Practitioner

## 2022-07-30 ENCOUNTER — Other Ambulatory Visit: Payer: Self-pay

## 2022-07-31 ENCOUNTER — Other Ambulatory Visit: Payer: Self-pay

## 2022-09-22 ENCOUNTER — Other Ambulatory Visit: Payer: Self-pay

## 2022-09-23 ENCOUNTER — Encounter: Payer: Self-pay | Admitting: Pharmacy Technician

## 2022-09-23 ENCOUNTER — Other Ambulatory Visit: Payer: Self-pay

## 2022-10-13 ENCOUNTER — Ambulatory Visit: Payer: Medicaid Other | Admitting: Nurse Practitioner

## 2022-12-19 ENCOUNTER — Other Ambulatory Visit (HOSPITAL_COMMUNITY): Payer: Self-pay

## 2022-12-19 ENCOUNTER — Other Ambulatory Visit (HOSPITAL_BASED_OUTPATIENT_CLINIC_OR_DEPARTMENT_OTHER): Payer: Self-pay

## 2022-12-19 ENCOUNTER — Other Ambulatory Visit: Payer: Self-pay | Admitting: Family Medicine

## 2022-12-19 ENCOUNTER — Other Ambulatory Visit: Payer: Self-pay

## 2022-12-19 ENCOUNTER — Other Ambulatory Visit: Payer: Self-pay | Admitting: Nurse Practitioner

## 2022-12-19 DIAGNOSIS — I10 Essential (primary) hypertension: Secondary | ICD-10-CM

## 2022-12-19 MED ORDER — VALSARTAN 160 MG PO TABS
160.0000 mg | ORAL_TABLET | Freq: Every day | ORAL | 0 refills | Status: DC
Start: 2022-12-19 — End: 2023-01-23
  Filled 2022-12-19: qty 30, 30d supply, fill #0

## 2022-12-19 MED ORDER — AMLODIPINE BESYLATE 10 MG PO TABS
10.0000 mg | ORAL_TABLET | Freq: Every day | ORAL | 0 refills | Status: DC
Start: 2022-12-19 — End: 2023-01-23
  Filled 2022-12-19: qty 30, 30d supply, fill #0

## 2022-12-22 ENCOUNTER — Other Ambulatory Visit: Payer: Self-pay

## 2022-12-22 ENCOUNTER — Other Ambulatory Visit: Payer: Self-pay | Admitting: Family Medicine

## 2022-12-23 ENCOUNTER — Other Ambulatory Visit: Payer: Self-pay

## 2022-12-23 ENCOUNTER — Other Ambulatory Visit (HOSPITAL_COMMUNITY): Payer: Self-pay

## 2022-12-23 MED ORDER — MEGESTROL ACETATE 40 MG PO TABS
ORAL_TABLET | ORAL | 3 refills | Status: AC
  Filled 2022-12-23: qty 45, 90d supply, fill #0
  Filled 2023-07-16: qty 45, 90d supply, fill #1
  Filled 2023-09-29: qty 45, 90d supply, fill #2

## 2022-12-24 ENCOUNTER — Other Ambulatory Visit: Payer: Self-pay

## 2023-01-23 ENCOUNTER — Other Ambulatory Visit: Payer: Self-pay

## 2023-01-23 ENCOUNTER — Other Ambulatory Visit: Payer: Self-pay | Admitting: Family Medicine

## 2023-01-23 DIAGNOSIS — I10 Essential (primary) hypertension: Secondary | ICD-10-CM

## 2023-01-23 MED ORDER — VALSARTAN 160 MG PO TABS
160.0000 mg | ORAL_TABLET | Freq: Every day | ORAL | 1 refills | Status: DC
Start: 2023-01-23 — End: 2023-03-06
  Filled 2023-01-23: qty 30, 30d supply, fill #0
  Filled 2023-02-23 (×2): qty 30, 30d supply, fill #1

## 2023-01-23 MED ORDER — AMLODIPINE BESYLATE 10 MG PO TABS
10.0000 mg | ORAL_TABLET | Freq: Every day | ORAL | 1 refills | Status: DC
Start: 2023-01-23 — End: 2023-03-06
  Filled 2023-01-23: qty 30, 30d supply, fill #0
  Filled 2023-02-23 (×2): qty 30, 30d supply, fill #1

## 2023-02-23 ENCOUNTER — Other Ambulatory Visit: Payer: Self-pay

## 2023-02-24 ENCOUNTER — Other Ambulatory Visit: Payer: Self-pay

## 2023-02-26 ENCOUNTER — Ambulatory Visit: Payer: Medicaid Other | Admitting: Nurse Practitioner

## 2023-02-27 ENCOUNTER — Ambulatory Visit: Payer: Medicaid Other | Admitting: Nurse Practitioner

## 2023-03-06 ENCOUNTER — Telehealth: Payer: Self-pay | Admitting: Physician Assistant

## 2023-03-06 ENCOUNTER — Encounter: Payer: Self-pay | Admitting: Physician Assistant

## 2023-03-06 ENCOUNTER — Other Ambulatory Visit: Payer: Self-pay

## 2023-03-06 ENCOUNTER — Ambulatory Visit: Payer: Self-pay | Attending: Physician Assistant | Admitting: Physician Assistant

## 2023-03-06 VITALS — BP 156/100 | HR 87 | Wt 289.8 lb

## 2023-03-06 DIAGNOSIS — D72829 Elevated white blood cell count, unspecified: Secondary | ICD-10-CM

## 2023-03-06 DIAGNOSIS — I10 Essential (primary) hypertension: Secondary | ICD-10-CM

## 2023-03-06 DIAGNOSIS — F5101 Primary insomnia: Secondary | ICD-10-CM

## 2023-03-06 DIAGNOSIS — F4321 Adjustment disorder with depressed mood: Secondary | ICD-10-CM

## 2023-03-06 DIAGNOSIS — E876 Hypokalemia: Secondary | ICD-10-CM

## 2023-03-06 MED ORDER — AMLODIPINE BESYLATE 10 MG PO TABS
10.0000 mg | ORAL_TABLET | Freq: Every day | ORAL | 1 refills | Status: DC
Start: 2023-03-06 — End: 2023-06-05
  Filled 2023-03-06 – 2023-03-30 (×3): qty 30, 30d supply, fill #0
  Filled 2023-05-06: qty 30, 30d supply, fill #1

## 2023-03-06 MED ORDER — VALSARTAN 160 MG PO TABS
160.0000 mg | ORAL_TABLET | Freq: Every day | ORAL | 1 refills | Status: DC
  Filled 2023-03-06 – 2023-03-30 (×2): qty 30, 30d supply, fill #0
  Filled 2023-05-06: qty 30, 30d supply, fill #1

## 2023-03-06 MED ORDER — HYDROXYZINE HCL 25 MG PO TABS
12.5000 mg | ORAL_TABLET | Freq: Three times a day (TID) | ORAL | 2 refills | Status: DC | PRN
Start: 2023-03-06 — End: 2023-11-04
  Filled 2023-03-06: qty 40, 13d supply, fill #0
  Filled 2023-05-06: qty 40, 13d supply, fill #1

## 2023-03-06 MED ORDER — CARVEDILOL 25 MG PO TABS
25.0000 mg | ORAL_TABLET | Freq: Two times a day (BID) | ORAL | 1 refills | Status: DC
  Filled 2023-03-06: qty 180, 90d supply, fill #0
  Filled 2023-03-09: qty 60, 30d supply, fill #0

## 2023-03-06 MED ORDER — SERTRALINE HCL 50 MG PO TABS
50.0000 mg | ORAL_TABLET | Freq: Every day | ORAL | 3 refills | Status: AC
Start: 2023-03-06 — End: ?
  Filled 2023-03-06: qty 30, 30d supply, fill #0
  Filled 2023-05-06: qty 30, 30d supply, fill #1

## 2023-03-06 NOTE — Patient Instructions (Signed)
On the sertraline tablet take 1/2 tab daily for 1 week then increase to 1 tab daily   Call Hospice at [phone] and ask for grief support information  Managing Loss, Adult People experience loss in many different ways throughout their lives. Events such as moving, changing jobs, and losing friends can create a sense of loss. The loss may be as serious as a major health change, divorce, death of a pet, or death of a loved one. All of these types of loss are likely to create a physical and emotional reaction known as grief. Grief is the result of a major change or an absence of something or someone that you count on. Grief is a normal reaction to loss. A variety of factors can affect your grieving experience, including: The nature of your loss. Your relationship to what or whom you lost. Your understanding of grief and how to manage it. Your support system. Be aware that when grief becomes extreme, it can lead to more severe issues like isolation, depression, anxiety, or suicidal thoughts. Talk with your health care provider if you have any of these issues. How to manage lifestyle changes Keep to your normal routine as much as possible. If you have trouble focusing or doing normal activities, it is acceptable to take some time away from your normal routine. Spend time with friends and loved ones. Eat a healthy diet, get plenty of sleep, and rest when you feel tired. How to recognize changes  The way that you deal with your grief will affect your ability to function as you normally do. When grieving, you may experience these changes: Numbness, shock, sadness, anxiety, anger, denial, and guilt. Thoughts about death. Unexpected crying. A physical sensation of emptiness in your stomach. Problems sleeping and eating. Tiredness (fatigue). Loss of interest in normal activities. Dreaming about or imagining seeing the person who died. A need to remember what or whom you lost. Difficulty  thinking about anything other than your loss for a period of time. Relief. If you have been expecting the loss for a while, you may feel a sense of relief when it happens. Follow these instructions at home: Activity Express your feelings in healthy ways, such as: Talking with others about your loss. It may be helpful to find others who have had a similar loss, such as a support group. Writing down your feelings in a journal. Doing physical activities to release stress and emotional energy. Doing creative activities like painting, sculpting, or playing or listening to music. Practicing resilience. This is the ability to recover and adjust after facing challenges. Reading some resources that encourage resilience may help you to learn ways to practice those behaviors.  General instructions Be patient with yourself and others. Allow the grieving process to happen, and remember that grieving takes time. It is likely that you may never feel completely done with some grief. You may find a way to move on while still cherishing memories and feelings about your loss. Accepting your loss is a process. It can take months or longer to adjust. Keep all follow-up visits. This is important. Where to find support To get support for managing loss: Ask your health care provider for help and recommendations, such as grief counseling or therapy. Think about joining a support group for people who are managing a loss. Where to find more information You can find more information about managing loss from: American Society of Clinical Oncology: www.cancer.net American Psychological Association: [employer] Contact a health care provider  if: Your grief is extreme and keeps getting worse. You have ongoing grief that does not improve. Your body shows symptoms of grief, such as illness. You feel depressed, anxious, or hopeless. Get help right away if: You have thoughts about hurting yourself or others. Get help  right away if you feel like you may hurt yourself or others, or have thoughts about taking your own life. Go to your nearest emergency room or: Call 911. Call the National Suicide Prevention Lifeline at 7634036274 or 988. This is open 24 hours a day. Text the Crisis Text Line at 979-749-4413. Summary Grief is the result of a major change or an absence of someone or something that you count on. Grief is a normal reaction to loss. The depth of grief and the period of recovery depend on the type of loss and your ability to adjust to the change and process your feelings. Processing grief requires patience and a willingness to accept your feelings and talk about your loss with people who are supportive. It is important to find resources that work for you and to realize that people experience grief differently. There is not one grieving process that works for everyone in the same way. Be aware that when grief becomes extreme, it can lead to more severe issues like isolation, depression, anxiety, or suicidal thoughts. Talk with your health care provider if you have any of these issues. This information is not intended to replace advice given to you by your health care provider. Make sure you discuss any questions you have with your health care provider. Document Revised: 10/22/2020 Document Reviewed: 10/22/2020 Elsevier Patient Education  2024 [employer].

## 2023-03-06 NOTE — Progress Notes (Signed)
Patient ID: Meagan Nelson, female   DOB: 09/28/1975, 47 y.o.   MRN: [id number]   Meagan Nelson, is a 47 y.o. female  [id number]  [id number]  DOB - [birth year]  Chief Complaint  Patient presents with   Depression       Subjective:   Meagan Nelson is a 47 y.o. female here today struggling with grieving.  When she was last here in March 2024, her son was in ICU in [place] and expected to recover after an MVC.  He was improving then took a turn for the worst in 09-04-22 and died of a brain infection.  He was 47 years old.  She has not done any counseling.  She has 2 38 yr old twin daughters and a 45 yr old daughter at home.  She also has a 57 yr old son.  Her family is supportive but they have all been struggling with grief. She denies SI/HI.  Sleep is poor.  Sad all the time.  Tries to "hold it together" for her kids.    BP at home is about 130/78 on average.      No problems updated.  ALLERGIES: Allergies  Allergen Reactions   Naprosyn [Naproxen] Nausea And Vomiting    PAST MEDICAL HISTORY: Past Medical History:  Diagnosis Date   Abscess    facial   Anemia    Headache    History of blood transfusion    Hypertension    PMH   Insomnia     MEDICATIONS AT HOME: Prior to Admission medications   Medication Sig Start Date End Date Taking? Authorizing Provider  atorvastatin (LIPITOR) 20 MG tablet Take 1 tablet (20 mg total) by mouth daily. 06/10/22  Yes Claiborne Rigg, NP  carvedilol (COREG) 25 MG tablet Take 1 tablet (25 mg total) by mouth 2 (two) times daily with a meal. 06/10/22  Yes Claiborne Rigg, NP  cetirizine (ZYRTEC ALLERGY) 10 MG tablet Take 1 tablet (10 mg total) by mouth daily. 03/08/20  Yes Hall-Potvin, [place], PA-C  cyclobenzaprine (FLEXERIL) 10 MG tablet Take 1 tablet (10 mg total) by mouth 3 (three) times daily as needed for muscle spasms. 06/10/22  Yes Claiborne Rigg, NP  hydrOXYzine (ATARAX) 25 MG tablet Take 0.5-1 tablets (12.5-25 mg total) by mouth 3 (three)  times daily as needed for anxiety or to help with sleep. 03/06/23  Yes Georgian Co M, PA-C  megestrol (MEGACE) 40 MG tablet Take 0.5 tablets (20 mg total) by mouth daily. 07/22/22 07/22/23 Yes Levie Heritage, DO  megestrol (MEGACE) 40 MG tablet TAKE 0.5 TABLETS (20 MG TOTAL) BY MOUTH DAILY. 12/23/22 12/23/23 Yes Levie Heritage, DO  potassium chloride SA (KLOR-CON M) 20 MEQ tablet Take 1 tablet (20 mEq total) by mouth daily. 06/10/22  Yes Claiborne Rigg, NP  sertraline (ZOLOFT) 50 MG tablet Take 1 tablet (50 mg total) by mouth daily. 03/06/23  Yes Georgian Co M, PA-C  valsartan (DIOVAN) 160 MG tablet Take 1 tablet (160 mg total) by mouth daily.Must have office visit for refills. 01/23/23  Yes Hoy Register, MD  amLODipine (NORVASC) 10 MG tablet Take 1 tablet (10 mg total) by mouth daily. 03/06/23   Anders Simmonds, PA-C    ROS: Neg HEENT Neg resp Neg cardiac Neg GI Neg GU Neg MS Neg psych Neg neuro  Objective:   Vitals:   03/06/23 1433  BP: (!) 156/100  Pulse: 87  SpO2: 94%  Weight: 289 lb 12.8 oz (131.5 kg)  Exam General appearance : Awake, alert, not in any distress. Speech Clear. Not toxic looking HEENT: Atraumatic and Normocephalic, pupils equally reactive to light and accomodation Neck: Supple, no JVD. No cervical lymphadenopathy.  Chest: Good air entry bilaterally, CTAB.  No rales/rhonchi/wheezing CVS: S1 S2 regular, no murmurs.  Abdomen: Bowel sounds present, Non tender and not distended with no gaurding, rigidity or rebound. Extremities: B/L Lower Ext shows no edema, both legs are warm to touch Neurology: Awake alert, and oriented X 3, CN II-XII intact, Non focal Skin: No Rash  Data Review Lab Results  Component Value Date   HGBA1C 5.4 10/04/2020   HGBA1C 5.6 05/30/2019    Assessment & Plan   1. Essential hypertension (Primary) Controlled based on home readings-continue current regimen - amLODipine (NORVASC) 10 MG tablet; Take 1 tablet (10 mg total)  by mouth daily.  Dispense: 30 tablet; Refill: 1 - Comprehensive metabolic panel - CBC with Differential - valsartan (DIOVAN) 160 MG tablet; Take 1 tablet (160 mg total) by mouth daily.Must have office visit for refills.  Dispense: 30 tablet; Refill: 1 - carvedilol (COREG) 25 MG tablet; Take 1 tablet (25 mg total) by mouth 2 (two) times daily with a meal.  Dispense: 180 tablet; Refill: 1  2. Leukocytosis, unspecified type - CBC with Differential  3. Hypokalemia - Comprehensive metabolic panel  4. Grief reaction Gave hospice information for counseling and will have LCSW reach out as well for resources.   - sertraline (ZOLOFT) 50 MG tablet; Take 1 tablet (50 mg total) by mouth daily.  Dispense: 30 tablet; Refill: 3 - hydrOXYzine (ATARAX) 25 MG tablet; Take 0.5-1 tablets (12.5-25 mg total) by mouth 3 (three) times daily as needed for anxiety or to help with sleep.  Dispense: 40 tablet; Refill: 2  5. Primary insomnia - hydrOXYzine (ATARAX) 25 MG tablet; Take 0.5-1 tablets (12.5-25 mg total) by mouth 3 (three) times daily as needed for anxiety or to help with sleep.  Dispense: 40 tablet; Refill: 2     Return in about 6 weeks (around 04/17/2023) for with me or [name] to follow up on meds/counseling for grief.  The patient was given clear instructions to go to ER or return to medical center if symptoms don't improve, worsen or new problems develop. The patient verbalized understanding. The patient was told to call to get lab results if they haven't heard anything in the next week.      Georgian Co, PA-C [facility] [place], [place] [phone]   03/06/2023, 5:07 PM

## 2023-03-06 NOTE — Telephone Encounter (Signed)
Please see my note from today.  Her 47 yr old son died.  Needs grief support and counseling resources

## 2023-03-07 LAB — CBC WITH DIFFERENTIAL/PLATELET
Basophils Absolute: 0.1 10*3/uL (ref 0.0–0.2)
Basos: 1 %
EOS (ABSOLUTE): 0.3 10*3/uL (ref 0.0–0.4)
Eos: 3 %
Hematocrit: 47.5 % — ABNORMAL HIGH (ref 34.0–46.6)
Hemoglobin: 15.8 g/dL (ref 11.1–15.9)
Immature Grans (Abs): 0 10*3/uL (ref 0.0–0.1)
Immature Granulocytes: 0 %
Lymphocytes Absolute: 2.9 10*3/uL (ref 0.7–3.1)
Lymphs: 22 %
MCH: 30.8 pg (ref 26.6–33.0)
MCHC: 33.3 g/dL (ref 31.5–35.7)
MCV: 93 fL (ref 79–97)
Monocytes Absolute: 1 10*3/uL — ABNORMAL HIGH (ref 0.1–0.9)
Monocytes: 7 %
Neutrophils Absolute: 8.9 10*3/uL — ABNORMAL HIGH (ref 1.4–7.0)
Neutrophils: 67 %
Platelets: 211 10*3/uL (ref 150–450)
RBC: 5.13 x10E6/uL (ref 3.77–5.28)
RDW: 13.8 % (ref 11.7–15.4)
WBC: 13.3 10*3/uL — ABNORMAL HIGH (ref 3.4–10.8)

## 2023-03-07 LAB — COMPREHENSIVE METABOLIC PANEL
ALT: 13 [IU]/L (ref 0–32)
AST: 16 [IU]/L (ref 0–40)
Albumin: 4 g/dL (ref 3.9–4.9)
Alkaline Phosphatase: 84 [IU]/L (ref 44–121)
BUN/Creatinine Ratio: 18 (ref 9–23)
BUN: 13 mg/dL (ref 6–24)
Bilirubin Total: 0.5 mg/dL (ref 0.0–1.2)
CO2: 24 mmol/L (ref 20–29)
Calcium: 9.2 mg/dL (ref 8.7–10.2)
Chloride: 104 mmol/L (ref 96–106)
Creatinine, Ser: 0.73 mg/dL (ref 0.57–1.00)
Globulin, Total: 2.7 g/dL (ref 1.5–4.5)
Glucose: 96 mg/dL (ref 70–99)
Potassium: 3.4 mmol/L — ABNORMAL LOW (ref 3.5–5.2)
Sodium: 142 mmol/L (ref 134–144)
Total Protein: 6.7 g/dL (ref 6.0–8.5)
eGFR: 102 mL/min/{1.73_m2} (ref >59)

## 2023-03-09 ENCOUNTER — Other Ambulatory Visit: Payer: Self-pay | Admitting: Physician Assistant

## 2023-03-09 ENCOUNTER — Telehealth: Payer: Self-pay

## 2023-03-09 ENCOUNTER — Other Ambulatory Visit: Payer: Self-pay

## 2023-03-09 DIAGNOSIS — D72829 Elevated white blood cell count, unspecified: Secondary | ICD-10-CM

## 2023-03-09 NOTE — Telephone Encounter (Signed)
Patient viewed results and Doctor comment through [facility]

## 2023-03-09 NOTE — Telephone Encounter (Signed)
-----   Message from Georgian Co sent at 03/09/2023  8:59 AM EST ----- Your white blood cells are still elevated.  I believe the hematologist/blood specialist wanted to see you annually, so I have placed a referral since you haven't seen them since 2022.  Your potassium is a little low-you can incorporate foods like banana, sweet potato, spinach, kale, brussels sprouts, tomatoes into your diet several days a week to help with this.  Kidney and liver function are normal.  Thanks, Georgian Co, PA-C

## 2023-03-17 ENCOUNTER — Telehealth: Payer: Self-pay | Admitting: Hematology and Oncology

## 2023-03-17 ENCOUNTER — Telehealth: Payer: Self-pay | Admitting: Licensed Clinical Social Worker

## 2023-03-17 NOTE — Telephone Encounter (Signed)
 LCSWA called patient today to introduce herself and to assess patients' mental health needs. Patient was referred by PCP for er 47 yr old son died. Needs grief support and counseling resources.pt received counseling resources, etc

## 2023-03-17 NOTE — Telephone Encounter (Signed)
Spoke with patient confirming upcoming appointment  

## 2023-03-20 ENCOUNTER — Other Ambulatory Visit: Payer: Self-pay

## 2023-03-30 ENCOUNTER — Other Ambulatory Visit: Payer: Self-pay

## 2023-04-09 ENCOUNTER — Other Ambulatory Visit: Payer: Self-pay

## 2023-04-09 DIAGNOSIS — I10 Essential (primary) hypertension: Secondary | ICD-10-CM

## 2023-04-10 ENCOUNTER — Inpatient Hospital Stay: Payer: Medicaid Other | Admitting: Hematology and Oncology

## 2023-04-10 ENCOUNTER — Inpatient Hospital Stay: Payer: Medicaid Other | Attending: Hematology and Oncology

## 2023-04-22 ENCOUNTER — Ambulatory Visit: Payer: Medicaid Other | Admitting: Physician Assistant

## 2023-05-06 ENCOUNTER — Other Ambulatory Visit: Payer: Self-pay

## 2023-05-07 ENCOUNTER — Other Ambulatory Visit: Payer: Self-pay

## 2023-05-07 ENCOUNTER — Ambulatory Visit: Payer: Medicaid Other | Admitting: Physician Assistant

## 2023-06-05 ENCOUNTER — Other Ambulatory Visit: Payer: Self-pay | Admitting: Nurse Practitioner

## 2023-06-05 DIAGNOSIS — I10 Essential (primary) hypertension: Secondary | ICD-10-CM

## 2023-06-05 MED ORDER — VALSARTAN 160 MG PO TABS
160.0000 mg | ORAL_TABLET | Freq: Every day | ORAL | 0 refills | Status: DC
  Filled 2023-06-05: qty 90, 90d supply, fill #0

## 2023-06-05 MED ORDER — AMLODIPINE BESYLATE 10 MG PO TABS
10.0000 mg | ORAL_TABLET | Freq: Every day | ORAL | 0 refills | Status: DC
  Filled 2023-06-05: qty 90, 90d supply, fill #0

## 2023-06-05 NOTE — Telephone Encounter (Signed)
 Requested Prescriptions  Pending Prescriptions Disp Refills   amLODipine (NORVASC) 10 MG tablet 30 tablet 1    Sig: Take 1 tablet (10 mg total) by mouth daily.     Cardiovascular: Calcium Channel Blockers 2 Failed - 06/05/2023  5:52 PM      Failed - Last BP in normal range    BP Readings from Last 1 Encounters:  03/06/23 (!) 156/100         Passed - Last Heart Rate in normal range    Pulse Readings from Last 1 Encounters:  03/06/23 87         Passed - Valid encounter within last 6 months    Recent Outpatient Visits           3 months ago Essential hypertension   [facility] [place]. [facility], Marylene Land M, [place]   12 months ago Primary hypertension   [facility] [facility] [place] [facility] [place]. [facility] Claiborne Rigg, NP   1 year ago Essential hypertension   [facility] [facility] [place]. [facility] Drucilla Chalet, RPH-CPP   1 year ago Essential hypertension   [place] [facility] [place] - [facility] Dept Of [place]. [facility] Drucilla Chalet, RPH-CPP   2 years ago Essential hypertension   [place] [facility] [place] [facility] Dept Of [place]. [facility] [place], [place] W, NP               carvedilol (COREG) 25 MG tablet 180 tablet 1    Sig: Take 1 tablet (25 mg total) by mouth 2 (two) times daily with a meal.     Cardiovascular: Beta Blockers 3 Failed - 06/05/2023  5:52 PM      Failed - Last BP in normal range    BP Readings from Last 1 Encounters:  03/06/23 (!) 156/100         Passed - Cr in normal range and within 360 days    Creatinine, Ser  Date Value Ref Range Status  03/06/2023 0.73 0.57 - 1.00 mg/dL Final   Creatinine, Urine  Date Value Ref Range Status  03/12/2009 52.9 mg/dL Final         Passed - AST in normal range and within 360 days    AST  Date Value Ref Range Status  03/06/2023 16 0 - 40 IU/L Final          Passed - ALT in normal range and within 360 days    ALT  Date Value Ref Range Status  03/06/2023 13 0 - 32 IU/L Final         Passed - Last Heart Rate in normal range    Pulse Readings from Last 1 Encounters:  03/06/23 87         Passed - Valid encounter within last 6 months    Recent Outpatient Visits           3 months ago Essential hypertension   [place] [place] [facility] [facility] [place]. [facility], Marylene Land M, [place]   12 months ago Primary hypertension   [place] [facility] [place] [facility] Dept Of [place]. [facility] Claiborne Rigg, NP   1 year ago Essential hypertension    [facility] [place] - A Dept Of  [place]. [facility] Drucilla Chalet, RPH-CPP   1 year ago Essential hypertension   [place] [facility] [place] - [facility] Dept Of [place]. [facility] Drucilla Chalet, RPH-CPP   2 years ago Essential hypertension   [facility] [facility] [place] [facility] Dept Of [place]. [facility] [place], [place] W, NP               valsartan (DIOVAN) 160 MG tablet 30 tablet 1    Sig: Take 1 tablet (160 mg total) by mouth daily.Must have office visit for refills.     Cardiovascular:  Angiotensin Receptor Blockers Failed - 06/05/2023  5:52 PM      Failed - K in normal range and within 180 days    Potassium  Date Value Ref Range Status  03/06/2023 3.4 (L) 3.5 - 5.2 mmol/L Final         Failed - Last BP in normal range    BP Readings from Last 1 Encounters:  03/06/23 (!) 156/100         Passed - Cr in normal range and within 180 days    Creatinine, Ser  Date Value Ref Range Status  03/06/2023 0.73 0.57 - 1.00 mg/dL Final   Creatinine, Urine  Date Value Ref Range Status  03/12/2009 52.9 mg/dL Final         Passed - Patient is not pregnant      Passed - Valid encounter within last 6 months    Recent Outpatient Visits           3 months ago Essential hypertension   [facility] [place]. [facility], Marylene Land M, [place]   12 months ago Primary hypertension   [place] [facility] Dept Of [place]. [facility] Claiborne Rigg, NP   1 year ago Essential hypertension   [place] [facility] [place] - [facility] Dept Of [place]. [facility] Drucilla Chalet, RPH-CPP   1 year ago Essential hypertension   [place] [facility] [place] - A Dept Of [place]. [facility] Drucilla Chalet, RPH-CPP   2 years ago Essential hypertension    [facility] [place] - A Dept Of [place]. [facility] Claiborne Rigg, [place]

## 2023-06-05 NOTE — Telephone Encounter (Signed)
 Copied from CRM [id number]. Topic: Clinical - Medication Refill >> Jun 05, 2023  8:06 AM Franchot Heidelberg wrote: Most Recent Primary Care Visit:  Provider: Anders Simmonds  [facility]  Visit Type: OFFICE VISIT  Date: 03/06/2023  Medication: amLODipine (NORVASC) 10 MG tablet valsartan (DIOVAN) 160 MG tablet  carvedilol (COREG) 25 MG tablet  Has the patient contacted their pharmacy? Yes (Agent: If no, request that the patient contact the pharmacy for the refill. If patient does not wish to contact the pharmacy document the reason why and proceed with request.) (Agent: If yes, when and what did the pharmacy advise?)  Is this the correct pharmacy for this prescription? Yes If no, delete pharmacy and type the correct one.  This is the patient's preferred pharmacy:  [facility] [address] [place] [address] Phone: [phone] Fax: [phone]   Has the prescription been filled recently? Yes  Is the patient out of the medication? Yes  Has the patient been seen for an appointment in the last year OR does the patient have an upcoming appointment? Yes  Can we respond through MyChart? Yes  Agent: Please be advised that Rx refills may take up to 3 business days. We ask that you follow-up with your pharmacy.

## 2023-06-08 ENCOUNTER — Other Ambulatory Visit: Payer: Self-pay

## 2023-06-10 ENCOUNTER — Ambulatory Visit: Admitting: Nurse Practitioner

## 2023-07-16 ENCOUNTER — Other Ambulatory Visit: Payer: Self-pay

## 2023-07-17 ENCOUNTER — Other Ambulatory Visit: Payer: Self-pay

## 2023-08-31 ENCOUNTER — Other Ambulatory Visit: Payer: Self-pay | Admitting: Nurse Practitioner

## 2023-08-31 DIAGNOSIS — I10 Essential (primary) hypertension: Secondary | ICD-10-CM

## 2023-08-31 NOTE — Telephone Encounter (Unsigned)
 Copied from CRM [id number]. Topic: Clinical - Medication Refill >> Aug 31, 2023  9:43 AM Corin V wrote: Medication:  amLODipine  (NORVASC ) 10 MG tablet valsartan  (DIOVAN ) 160 MG tablet   Has the patient contacted their pharmacy? Yes (Agent: If no, request that the patient contact the pharmacy for the refill. If patient does not wish to contact the pharmacy document the reason why and proceed with request.) (Agent: If yes, when and what did the pharmacy advise?)  This is the patient's preferred pharmacy:  [facility] [address] [place] [address] Phone: [phone] Fax: [phone]  Is this the correct pharmacy for this prescription? Yes If no, delete pharmacy and type the correct one.   Has the prescription been filled recently? No  Is the patient out of the medication? Yes  Has the patient been seen for an appointment in the last year OR does the patient have an upcoming appointment? Yes  Can we respond through MyChart? No  Agent: Please be advised that Rx refills may take up to 3 business days. We ask that you follow-up with your pharmacy.

## 2023-09-02 ENCOUNTER — Other Ambulatory Visit: Payer: Self-pay | Admitting: Nurse Practitioner

## 2023-09-02 ENCOUNTER — Other Ambulatory Visit: Payer: Self-pay

## 2023-09-02 DIAGNOSIS — I10 Essential (primary) hypertension: Secondary | ICD-10-CM

## 2023-09-02 MED ORDER — AMLODIPINE BESYLATE 10 MG PO TABS
10.0000 mg | ORAL_TABLET | Freq: Every day | ORAL | 0 refills | Status: DC
  Filled 2023-09-02: qty 30, 30d supply, fill #0

## 2023-09-02 MED ORDER — VALSARTAN 160 MG PO TABS
160.0000 mg | ORAL_TABLET | Freq: Every day | ORAL | 0 refills | Status: DC
  Filled 2023-09-02: qty 30, 30d supply, fill #0

## 2023-09-02 NOTE — Telephone Encounter (Signed)
 Requested Prescriptions  Pending Prescriptions Disp Refills   valsartan  (DIOVAN ) 160 MG tablet 90 tablet 0    Sig: Take 1 tablet (160 mg total) by mouth daily.     Cardiovascular:  Angiotensin Receptor Blockers Failed - 09/02/2023 11:23 AM      Failed - K in normal range and within 180 days    Potassium  Date Value Ref Range Status  03/06/2023 3.4 (L) 3.5 - 5.2 mmol/L Final         Failed - Last BP in normal range    BP Readings from Last 1 Encounters:  03/06/23 (!) 156/100         Passed - Cr in normal range and within 180 days    Creatinine, Ser  Date Value Ref Range Status  03/06/2023 0.73 0.57 - 1.00 mg/dL Final   Creatinine, Urine  Date Value Ref Range Status  03/12/2009 52.9 mg/dL Final         Passed - Patient is not pregnant      Passed - Valid encounter within last 6 months    Recent Outpatient Visits           6 months ago Essential hypertension   [place] [facility] [place] [facility] [place]. [facility] [place], Stan Eans, [place]   1 year ago Primary hypertension   [place] [facility] [place] - [facility] Dept Of [place]. [facility] Collins Dean, NP   1 year ago Essential hypertension   [place] [facility] [place] - [facility] Dept Of [place]. [facility] Valente Gaskin, RPH-CPP   2 years ago Essential hypertension   [place] [facility] [place] - [facility] Dept Of [place]. [facility] Valente Gaskin, RPH-CPP   2 years ago Essential hypertension   [place] [facility] [place] [facility] Dept Of [place]. [facility] [place], [name] W, NP               amLODipine  (NORVASC ) 10 MG tablet 90 tablet 0    Sig: Take 1 tablet (10 mg total) by mouth daily.     Cardiovascular: Calcium  Channel Blockers 2 Failed - 09/02/2023 11:23 AM      Failed - Last BP in normal range    BP Readings from Last 1 Encounters:  03/06/23 (!) 156/100         Passed - Last Heart Rate in normal range    Pulse Readings  from Last 1 Encounters:  03/06/23 87         Passed - Valid encounter within last 6 months    Recent Outpatient Visits           6 months ago Essential hypertension   [place] [facility] [place] [facility] [place]. [facility] [place], Stan Eans, [place]   1 year ago Primary hypertension   [place] [facility] [place] [facility] [place]. [facility] Collins Dean, NP   1 year ago Essential hypertension   [facility] [facility] [place] - [facility] Dept Of [place]. [facility] Valente Gaskin, RPH-CPP   2 years ago Essential hypertension    [facility] [place] - A Dept Of . [facility] Valente Gaskin, RPH-CPP   2 years ago Essential hypertension    [facility] [place] - [facility] Dept Of . [facility] Collins Dean, [place]

## 2023-09-03 ENCOUNTER — Other Ambulatory Visit: Payer: Self-pay

## 2023-09-29 ENCOUNTER — Other Ambulatory Visit: Payer: Self-pay

## 2023-09-29 ENCOUNTER — Other Ambulatory Visit: Payer: Self-pay | Admitting: Nurse Practitioner

## 2023-09-29 DIAGNOSIS — I10 Essential (primary) hypertension: Secondary | ICD-10-CM

## 2023-09-29 MED ORDER — VALSARTAN 160 MG PO TABS
160.0000 mg | ORAL_TABLET | Freq: Every day | ORAL | 0 refills | Status: DC
  Filled 2023-09-29: qty 30, 30d supply, fill #0

## 2023-09-29 MED ORDER — AMLODIPINE BESYLATE 10 MG PO TABS
10.0000 mg | ORAL_TABLET | Freq: Every day | ORAL | 0 refills | Status: DC
  Filled 2023-09-29: qty 30, 30d supply, fill #0

## 2023-09-30 ENCOUNTER — Other Ambulatory Visit: Payer: Self-pay

## 2023-10-01 ENCOUNTER — Telehealth: Payer: Self-pay | Admitting: Family Medicine

## 2023-10-01 ENCOUNTER — Other Ambulatory Visit: Payer: Self-pay

## 2023-10-01 ENCOUNTER — Telehealth: Payer: Self-pay | Admitting: Nurse Practitioner

## 2023-10-01 NOTE — Telephone Encounter (Signed)
 Called pt to confirm appt. Pt will be present.

## 2023-10-02 ENCOUNTER — Ambulatory Visit: Admitting: Nurse Practitioner

## 2023-10-06 NOTE — Telephone Encounter (Signed)
 Error

## 2023-11-03 ENCOUNTER — Telehealth: Payer: Self-pay | Admitting: Nurse Practitioner

## 2023-11-03 NOTE — Telephone Encounter (Signed)
 Pt unconfirmed appt 8/19 lvm

## 2023-11-04 ENCOUNTER — Encounter: Payer: Self-pay | Admitting: Nurse Practitioner

## 2023-11-04 ENCOUNTER — Other Ambulatory Visit: Payer: Self-pay

## 2023-11-04 ENCOUNTER — Ambulatory Visit: Payer: Self-pay | Attending: Nurse Practitioner | Admitting: Nurse Practitioner

## 2023-11-04 VITALS — BP 148/95 | HR 89 | Resp 20 | Ht 66.0 in | Wt 282.0 lb

## 2023-11-04 DIAGNOSIS — M62838 Other muscle spasm: Secondary | ICD-10-CM | POA: Insufficient documentation

## 2023-11-04 DIAGNOSIS — Z79899 Other long term (current) drug therapy: Secondary | ICD-10-CM | POA: Insufficient documentation

## 2023-11-04 DIAGNOSIS — I10 Essential (primary) hypertension: Secondary | ICD-10-CM

## 2023-11-04 DIAGNOSIS — E785 Hyperlipidemia, unspecified: Secondary | ICD-10-CM

## 2023-11-04 DIAGNOSIS — R195 Other fecal abnormalities: Secondary | ICD-10-CM | POA: Insufficient documentation

## 2023-11-04 DIAGNOSIS — D72829 Elevated white blood cell count, unspecified: Secondary | ICD-10-CM

## 2023-11-04 DIAGNOSIS — E876 Hypokalemia: Secondary | ICD-10-CM

## 2023-11-04 DIAGNOSIS — F5101 Primary insomnia: Secondary | ICD-10-CM

## 2023-11-04 DIAGNOSIS — Z1211 Encounter for screening for malignant neoplasm of colon: Secondary | ICD-10-CM

## 2023-11-04 DIAGNOSIS — G8929 Other chronic pain: Secondary | ICD-10-CM

## 2023-11-04 DIAGNOSIS — M545 Low back pain, unspecified: Secondary | ICD-10-CM

## 2023-11-04 DIAGNOSIS — M25562 Pain in left knee: Secondary | ICD-10-CM

## 2023-11-04 DIAGNOSIS — Z760 Encounter for issue of repeat prescription: Secondary | ICD-10-CM | POA: Insufficient documentation

## 2023-11-04 DIAGNOSIS — F4320 Adjustment disorder, unspecified: Secondary | ICD-10-CM

## 2023-11-04 DIAGNOSIS — M25561 Pain in right knee: Secondary | ICD-10-CM

## 2023-11-04 MED ORDER — POTASSIUM CHLORIDE CRYS ER 20 MEQ PO TBCR
20.0000 meq | EXTENDED_RELEASE_TABLET | Freq: Every day | ORAL | 1 refills | Status: AC
  Filled 2023-11-04: qty 90, 90d supply, fill #0

## 2023-11-04 MED ORDER — VALSARTAN 160 MG PO TABS
160.0000 mg | ORAL_TABLET | Freq: Every day | ORAL | 1 refills | Status: AC
  Filled 2023-11-04: qty 90, 90d supply, fill #0
  Filled 2024-02-01: qty 90, 90d supply, fill #1

## 2023-11-04 MED ORDER — CARVEDILOL 25 MG PO TABS
25.0000 mg | ORAL_TABLET | Freq: Two times a day (BID) | ORAL | 1 refills | Status: AC
  Filled 2023-11-04: qty 180, 90d supply, fill #0

## 2023-11-04 MED ORDER — ATORVASTATIN CALCIUM 20 MG PO TABS
20.0000 mg | ORAL_TABLET | Freq: Every day | ORAL | 3 refills | Status: AC
  Filled 2023-11-04: qty 90, 90d supply, fill #0

## 2023-11-04 MED ORDER — HYDROXYZINE HCL 25 MG PO TABS
12.5000 mg | ORAL_TABLET | Freq: Three times a day (TID) | ORAL | 2 refills | Status: AC | PRN
  Filled 2023-11-04: qty 40, 14d supply, fill #0

## 2023-11-04 MED ORDER — CYCLOBENZAPRINE HCL 10 MG PO TABS
10.0000 mg | ORAL_TABLET | Freq: Three times a day (TID) | ORAL | 3 refills | Status: AC | PRN
  Filled 2023-11-04: qty 60, 20d supply, fill #0

## 2023-11-04 MED ORDER — AMLODIPINE BESYLATE 10 MG PO TABS
10.0000 mg | ORAL_TABLET | Freq: Every day | ORAL | 1 refills | Status: AC
  Filled 2023-11-04: qty 90, 90d supply, fill #0
  Filled 2024-02-01: qty 90, 90d supply, fill #1

## 2023-11-04 NOTE — Progress Notes (Signed)
 Assessment & Plan:  Meagan Nelson was seen today for hypertension.  Diagnoses and all orders for this visit:  Essential hypertension -     amLODipine  (NORVASC ) 10 MG tablet; Take 1 tablet (10 mg total) by mouth daily. -     carvedilol  (COREG ) 25 MG tablet; Take 1 tablet (25 mg total) by mouth 2 (two) times daily with a meal. -     valsartan  (DIOVAN ) 160 MG tablet; Take 1 tablet (160 mg total) by mouth daily. -     CMP14+EGFR  Dyslipidemia, goal LDL below 100 -     atorvastatin  (LIPITOR) 20 MG tablet; Take 1 tablet (20 mg total) by mouth daily.  Grief reaction -     hydrOXYzine  (ATARAX ) 25 MG tablet; Take 0.5-1 tablets (12.5-25 mg total) by mouth 3 (three) times daily as needed for anxiety or to help with sleep. Grief reaction following loss of son and father. Discussed limitations of medication and potential benefits of counseling. - Offer referral to grief counseling if she changes her mind.   Primary insomnia -     hydrOXYzine  (ATARAX ) 25 MG tablet; Take 0.5-1 tablets (12.5-25 mg total) by mouth 3 (three) times daily as needed for anxiety or to help with sleep.  Hypokalemia -     potassium chloride  SA (KLOR-CON  M) 20 MEQ tablet; Take 1 tablet (20 mEq total) by mouth daily.  Chronic right-sided low back pain without sciatica -     cyclobenzaprine  (FLEXERIL ) 10 MG tablet; Take 1 tablet (10 mg total) by mouth 3 (three) times daily as needed for muscle spasms.  Colon cancer screening -     Fecal occult blood, imunochemical([facility])  Leukocytosis, unspecified type -     CBC with Differential  Chronic pain of both knees Chronic knee pain with effusion and muscle spasms. Previous x-ray showed fluid accumulation. - Advise obtaining insurance for x-rays and potential orthopedic referral. - Order updated knee x-rays. - Discuss potential need for orthopedic evaluation, possible fluid aspiration, or steroid injection based on x-ray results. Muscle spasms associated with knee  pain. - Continue Flexeril  for muscle spasms.  Hypertension Hypertension management ongoing  Poor adherence   General Health Maintenance Routine health maintenance screenings due. Discussed importance and plan for screenings. - Order mammogram. - Schedule Pap smear for next visit. - Provide stool kit for colon cancer screening and plan for colonoscopy referral once insurance is obtained.   Patient has been counseled on age-appropriate routine health concerns for screening and prevention. These are reviewed and up-to-date. Referrals have been placed accordingly. Immunizations are up-to-date or declined.    Subjective:   Chief Complaint  Patient presents with   Hypertension    History of Present Illness Meagan Nelson is a 48 year old female who presents for HTN, medication refills and management of knee pain.   HTN Blood pressure is elevated today. She has not been consistently adherent with taking her antihypertensives.  Currently prescribed amlodipine , carvedilol  and valsartan  BP Readings from Last 3 Encounters:  11/04/23 (!) 148/95  03/06/23 (!) 156/100  06/10/22 (!) 158/98     She has a history of left knee pain and muscle spasms, which she describes as starting in her knees. She mentions having had an x-ray a long time ago that showed fluid on the knee. She has been trying to manage her weight but finds it challenging.  She is experiencing significant grief following the loss of her son on May 31st of last year and her father earlier this  year. She describes feeling that life will never be the same and is trying to cope by staying busy with work. She has two daughters, aged 17, who rely on her, and she feels the need to remain strong for them. She does not currently engage in grief counseling or therapy and prefers to handle her emotions independently.  Work is a significant part of her coping mechanism, as it helps her avoid thinking about her losses. She has not taken time  off from work, as she feels it is essential for her mental well-being.  Review of Systems  Constitutional:  Negative for fever, malaise/fatigue and weight loss.  HENT: Negative.  Negative for nosebleeds.   Eyes: Negative.  Negative for blurred vision, double vision and photophobia.  Respiratory: Negative.  Negative for cough and shortness of breath.   Cardiovascular: Negative.  Negative for chest pain, palpitations and leg swelling.  Gastrointestinal: Negative.  Negative for heartburn, nausea and vomiting.  Musculoskeletal:  Positive for joint pain. Negative for myalgias.  Neurological: Negative.  Negative for dizziness, focal weakness, seizures and headaches.  Psychiatric/Behavioral:  Positive for depression. Negative for suicidal ideas. The patient has insomnia.     Past Medical History:  Diagnosis Date   Abscess    facial   Anemia    Headache    History of blood transfusion    Hypertension    PMH   Insomnia     Past Surgical History:  Procedure Laterality Date   CESAREAN SECTION     C/S x 1   DILATATION & CURETTAGE/HYSTEROSCOPY WITH MYOSURE N/A 03/11/2017   Procedure: DILATATION & CURETTAGE/HYSTEROSCOPY WITH MYOSURE;  Surgeon: Starla Harland BROCKS, MD;  Location: [facility];  Service: Gynecology;  Laterality: N/A;   DILATION AND CURETTAGE OF UTERUS     TONSILLECTOMY      Family History  Problem Relation Age of Onset   Hypertension Mother    Anemia Sister    Anemia Brother    Anemia Sister    Anemia Other    Hypertension Other     Social History Reviewed with no changes to be made today.   Outpatient Medications Prior to Visit  Medication Sig Dispense Refill   amLODipine  (NORVASC ) 10 MG tablet Take 1 tablet (10 mg total) by mouth daily. 30 tablet 0   atorvastatin  (LIPITOR) 20 MG tablet Take 1 tablet (20 mg total) by mouth daily. 90 tablet 3   carvedilol  (COREG ) 25 MG tablet Take 1 tablet (25 mg total) by mouth 2 (two) times daily with a meal. 180 tablet 1    cyclobenzaprine  (FLEXERIL ) 10 MG tablet Take 1 tablet (10 mg total) by mouth 3 (three) times daily as needed for muscle spasms. 60 tablet 3   hydrOXYzine  (ATARAX ) 25 MG tablet Take 0.5-1 tablets (12.5-25 mg total) by mouth 3 (three) times daily as needed for anxiety or to help with sleep. 40 tablet 2   cetirizine  (ZYRTEC  ALLERGY) 10 MG tablet Take 1 tablet (10 mg total) by mouth daily. (Patient not taking: Reported on 11/04/2023) 30 tablet 0   megestrol  (MEGACE ) 40 MG tablet TAKE 0.5 TABLETS (20 MG TOTAL) BY MOUTH DAILY. (Patient not taking: Reported on 11/04/2023) 45 tablet 3   sertraline  (ZOLOFT ) 50 MG tablet Take 1 tablet (50 mg total) by mouth daily. (Patient not taking: Reported on 11/04/2023) 30 tablet 3   potassium chloride  SA (KLOR-CON  M) 20 MEQ tablet Take 1 tablet (20 mEq total) by mouth daily. (Patient not taking: Reported  on 11/04/2023) 90 tablet 1   valsartan  (DIOVAN ) 160 MG tablet Take 1 tablet (160 mg total) by mouth daily. (Patient not taking: Reported on 11/04/2023) 30 tablet 0   No facility-administered medications prior to visit.    Allergies  Allergen Reactions   Naprosyn  [Naproxen ] Nausea And Vomiting       Objective:    BP (!) 148/95 (BP Location: Left Arm, Patient Position: Sitting, Cuff Size: Large) Comment: BP repeat refusal  Pulse 89   Resp 20   Ht 5' 6 (1.676 m)   Wt 282 lb (127.9 kg)   LMP  (LMP Unknown) Comment: Last menstrual 2 months ago.  SpO2 100%   BMI 45.52 kg/m  Wt Readings from Last 3 Encounters:  11/04/23 282 lb (127.9 kg)  03/06/23 289 lb 12.8 oz (131.5 kg)  06/10/22 272 lb 6.4 oz (123.6 kg)    Physical Exam Vitals and nursing note reviewed.  Constitutional:      Appearance: She is well-developed.  HENT:     Head: Normocephalic and atraumatic.  Cardiovascular:     Rate and Rhythm: Normal rate and regular rhythm.     Heart sounds: Normal heart sounds. No murmur heard.    No friction rub. No gallop.  Pulmonary:     Effort: Pulmonary  effort is normal. No tachypnea or respiratory distress.     Breath sounds: Normal breath sounds. No decreased breath sounds, wheezing, rhonchi or rales.  Chest:     Chest wall: No tenderness.  Musculoskeletal:        General: Normal range of motion.     Cervical back: Normal range of motion.  Skin:    General: Skin is warm and dry.  Neurological:     Mental Status: She is alert and oriented to person, place, and time.     Coordination: Coordination normal.  Psychiatric:        Behavior: Behavior normal. Behavior is cooperative.        Thought Content: Thought content normal.        Judgment: Judgment normal.          Patient has been counseled extensively about nutrition and exercise as well as the importance of adherence with medications and regular follow-up. The patient was given clear instructions to go to ER or return to medical center if symptoms don't improve, worsen or new problems develop. The patient verbalized understanding.   Follow-up: Return in about 3 months (around 02/04/2024).   Haze LELON Servant, FNP-BC [facility] [place], [place] [phone]   11/23/2023, 6:44 AM

## 2023-11-05 ENCOUNTER — Other Ambulatory Visit: Payer: Self-pay

## 2023-11-05 ENCOUNTER — Ambulatory Visit: Payer: Self-pay | Admitting: Nurse Practitioner

## 2023-11-05 LAB — CMP14+EGFR
ALT: 18 IU/L (ref 0–32)
AST: 21 IU/L (ref 0–40)
Albumin: 4.4 g/dL (ref 3.9–4.9)
Alkaline Phosphatase: 74 IU/L (ref 44–121)
BUN/Creatinine Ratio: 17 (ref 9–23)
BUN: 13 mg/dL (ref 6–24)
Bilirubin Total: 0.5 mg/dL (ref 0.0–1.2)
CO2: 24 mmol/L (ref 20–29)
Calcium: 9.5 mg/dL (ref 8.7–10.2)
Chloride: 99 mmol/L (ref 96–106)
Creatinine, Ser: 0.75 mg/dL (ref 0.57–1.00)
Globulin, Total: 2.6 g/dL (ref 1.5–4.5)
Glucose: 88 mg/dL (ref 70–99)
Potassium: 3.5 mmol/L (ref 3.5–5.2)
Sodium: 140 mmol/L (ref 134–144)
Total Protein: 7 g/dL (ref 6.0–8.5)
eGFR: 98 mL/min/1.73 (ref >59)

## 2023-11-05 LAB — CBC WITH DIFFERENTIAL/PLATELET
Basophils Absolute: 0.1 x10E3/uL (ref 0.0–0.2)
Basos: 1 %
EOS (ABSOLUTE): 0.3 x10E3/uL (ref 0.0–0.4)
Eos: 3 %
Hematocrit: 54.4 % — ABNORMAL HIGH (ref 34.0–46.6)
Hemoglobin: 17.7 g/dL — ABNORMAL HIGH (ref 11.1–15.9)
Immature Grans (Abs): 0 x10E3/uL (ref 0.0–0.1)
Immature Granulocytes: 0 %
Lymphocytes Absolute: 2.8 x10E3/uL (ref 0.7–3.1)
Lymphs: 24 %
MCH: 30.3 pg (ref 26.6–33.0)
MCHC: 32.5 g/dL (ref 31.5–35.7)
MCV: 93 fL (ref 79–97)
Monocytes Absolute: 1 x10E3/uL — ABNORMAL HIGH (ref 0.1–0.9)
Monocytes: 9 %
Neutrophils Absolute: 7.7 x10E3/uL — ABNORMAL HIGH (ref 1.4–7.0)
Neutrophils: 63 %
Platelets: 260 x10E3/uL (ref 150–450)
RBC: 5.85 x10E6/uL — ABNORMAL HIGH (ref 3.77–5.28)
RDW: 13.7 % (ref 11.7–15.4)
WBC: 12 x10E3/uL — ABNORMAL HIGH (ref 3.4–10.8)

## 2023-11-23 ENCOUNTER — Encounter: Payer: Self-pay | Admitting: Nurse Practitioner

## 2024-02-01 ENCOUNTER — Other Ambulatory Visit: Payer: Self-pay

## 2024-02-03 ENCOUNTER — Telehealth: Payer: Self-pay | Admitting: Nurse Practitioner

## 2024-02-03 NOTE — Telephone Encounter (Signed)
 pt confirmed appt (per vr l)

## 2024-02-05 ENCOUNTER — Ambulatory Visit: Admitting: Nurse Practitioner

## 2024-02-08 ENCOUNTER — Other Ambulatory Visit: Payer: Self-pay

## 2024-02-09 ENCOUNTER — Other Ambulatory Visit: Payer: Self-pay
